# Patient Record
Sex: Male | Born: 1990
Health system: Southern US, Community
[De-identification: ages and names within clinical notes are randomized; demographics above are authoritative.]

## PROBLEM LIST (undated history)

## (undated) DIAGNOSIS — E669 Obesity, unspecified: Secondary | ICD-10-CM

## (undated) DIAGNOSIS — E1169 Type 2 diabetes mellitus with other specified complication: Secondary | ICD-10-CM

## (undated) DIAGNOSIS — F319 Bipolar disorder, unspecified: Secondary | ICD-10-CM

## (undated) DIAGNOSIS — J45909 Unspecified asthma, uncomplicated: Secondary | ICD-10-CM

## (undated) DIAGNOSIS — I10 Essential (primary) hypertension: Secondary | ICD-10-CM

## (undated) DIAGNOSIS — F988 Other specified behavioral and emotional disorders with onset usually occurring in childhood and adolescence: Secondary | ICD-10-CM

## (undated) HISTORY — DX: Bipolar disorder, unspecified: F31.9

## (undated) HISTORY — DX: Obesity, unspecified: E66.9

## (undated) HISTORY — DX: Essential (primary) hypertension: I10

## (undated) HISTORY — PX: NO PAST SURGERIES: SHX2092

## (undated) HISTORY — DX: Unspecified asthma, uncomplicated: J45.909

## (undated) HISTORY — DX: Other specified behavioral and emotional disorders with onset usually occurring in childhood and adolescence: F98.8

## (undated) HISTORY — DX: Type 2 diabetes mellitus with other specified complication: E11.69

---
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
## 2010-07-24 NOTE — Op Note (Signed)
   NAME:  Luis Boyer, Luis Boyer                          ACCOUNT NO.:  [id number]   MEDICAL RECORD NO.:  [id number]                   PATIENT TYPE:  AMB   LOCATION:  DSC                                  FACILITY:  [facility]   PHYSICIAN:  Pasty Spillers. Maple Hudson, M.D.              DATE OF BIRTH:  1990/06/20   DATE OF PROCEDURE:  11/23/2002  DATE OF DISCHARGE:                                 OPERATIVE REPORT   PREOPERATIVE DIAGNOSIS:  V pattern exotropia.   POSTOPERATIVE DIAGNOSIS:  V pattern exotropia.   PROCEDURE:  Lateral rectus muscle recession, 7.0 mm OU, with one-half tendon  width up-shift.   SURGEON:  Pasty Spillers. Maple Hudson, M.D.   ANESTHESIA:  General (laryngeal mask).   COMPLICATIONS:  None.   DESCRIPTION OF PROCEDURE:  After routine preoperative evaluation including  informed consent from the parents, the patient was taken to the operating  room, where he was identified by me.  General anesthesia was induced without  difficulty after placement of appropriate monitors.  The patient was prepped  and draped in standard sterile fashion.  A lid speculum placed in the right  eye.   Through an inferotemporal fornix incision through conjunctiva and Tenon's  fascia, the right lateral rectus muscle was engaged on a series of muscle  hooks and carefully cleared of its fascial attachments.  The tendon was  secured with a double-armed 6-0 Vicryl suture, with a double locking bite at  each border of the muscle, 1 mm from the insertion.  The muscle was  disinserted and was reattached to sclera with its inferior pole 7.0 mm  posterior to the middle of the original insertion, and its superior pole  approximately 10 mm superior to the inferior pole, in effect up-shifting it  one-half tendon width.  The suture ends were tied securely after the  position of the muscle had been checked and found to be accurate.  The  conjunctiva was closed with two interrupted 6-0 Vicryl sutures.  The lid  speculum was  transferred to the left eye, and an identical procedure was  performed, again effecting a 7.0 mm recession of the lateral rectus muscle,  with one-half tendon width up-shift.  TobraDex ophthalmic ointment was  placed in each eye.  The patient was awakened without difficulty and taken  to the recovery room in stable condition, having suffered no intraoperative  or immediate postoperative complications.                                               Pasty Spillers. Maple Hudson, M.D.    Cheron Schaumann  D:  11/23/2002  T:  11/24/2002  Job:  [id number]

## 2011-07-26 ENCOUNTER — Ambulatory Visit: Payer: Self-pay | Admitting: Family Medicine

## 2011-08-12 ENCOUNTER — Other Ambulatory Visit: Payer: Self-pay | Admitting: Family Medicine

## 2011-08-12 MED ORDER — NIFEDIPINE ER OSMOTIC RELEASE 90 MG PO TB24: 90.0000 mg | ORAL_TABLET | Freq: Every day | ORAL | Status: DC

## 2011-08-12 MED ORDER — QUINAPRIL HCL 10 MG PO TABS: 10.0000 mg | ORAL_TABLET | Freq: Every day | ORAL | Status: DC

## 2011-08-12 MED ORDER — METOPROLOL SUCCINATE ER 25 MG PO TB24: 25.0000 mg | ORAL_TABLET | Freq: Every day | ORAL | Status: DC

## 2011-08-12 NOTE — Telephone Encounter (Signed)
Refills x 3, spoke to Dr.Lowne she stated she would approve a 30-day supply only but to schedule patient for a medication f/u before she could approve. Spoke to [place] and she stated they do not have a car but she would arrange transportation next Wed. 06.13.13 @ 11am  1-refill Procardia XL 90 MG, take one tablet by mouth every day  2-Accupril 10MG , take one tablet by mouth every morning 3-Toprol XL 25 MG PAP, take one tablet by mouth every day   All qty would be 30  Last ov here 3.2009

## 2011-08-12 NOTE — Telephone Encounter (Signed)
Apt pending for 08/19/11. Please advise if it is ok to send these scripts.     KP

## 2011-08-12 NOTE — Telephone Encounter (Signed)
Ok to fill 1 month

## 2011-08-12 NOTE — Telephone Encounter (Signed)
Faxed to [facility] pharmacy at [phone]     KP

## 2011-08-19 ENCOUNTER — Ambulatory Visit: Payer: Self-pay | Admitting: Family Medicine

## 2011-08-24 ENCOUNTER — Ambulatory Visit (INDEPENDENT_AMBULATORY_CARE_PROVIDER_SITE_OTHER): Payer: Self-pay | Admitting: Family Medicine

## 2011-08-24 ENCOUNTER — Encounter: Payer: Self-pay | Admitting: Family Medicine

## 2011-08-24 VITALS — BP 122/82 | HR 71 | Temp 98.2°F | Ht 70.5 in | Wt 323.4 lb

## 2011-08-24 DIAGNOSIS — I10 Essential (primary) hypertension: Secondary | ICD-10-CM

## 2011-08-24 DIAGNOSIS — Z23 Encounter for immunization: Secondary | ICD-10-CM

## 2011-08-24 DIAGNOSIS — K219 Gastro-esophageal reflux disease without esophagitis: Secondary | ICD-10-CM

## 2011-08-24 DIAGNOSIS — R0609 Other forms of dyspnea: Secondary | ICD-10-CM

## 2011-08-24 DIAGNOSIS — F988 Other specified behavioral and emotional disorders with onset usually occurring in childhood and adolescence: Secondary | ICD-10-CM

## 2011-08-24 DIAGNOSIS — F319 Bipolar disorder, unspecified: Secondary | ICD-10-CM

## 2011-08-24 DIAGNOSIS — R0683 Snoring: Secondary | ICD-10-CM

## 2011-08-24 DIAGNOSIS — Z0000 Encounter for general adult medical examination without abnormal findings: Secondary | ICD-10-CM

## 2011-08-24 LAB — CBC WITH DIFFERENTIAL/PLATELET
Basophils Absolute: 0.1 10*3/uL (ref 0.0–0.1)
Eosinophils Absolute: 0.1 10*3/uL (ref 0.0–0.7)
Lymphocytes Relative: 34.5 % (ref 12.0–46.0)
Lymphs Abs: 2.4 10*3/uL (ref 0.7–4.0)
Monocytes Relative: 6.9 % (ref 3.0–12.0)
Platelets: 181 10*3/uL (ref 150.0–400.0)
RDW: 13.1 % (ref 11.5–14.6)

## 2011-08-24 LAB — LIPID PANEL
Cholesterol: 152 mg/dL (ref 0–200)
HDL: 36.8 mg/dL — ABNORMAL LOW (ref >39.00)
LDL Cholesterol: 92 mg/dL (ref 0–99)
VLDL: 23 mg/dL (ref 0.0–40.0)

## 2011-08-24 LAB — BASIC METABOLIC PANEL
BUN: 9 mg/dL (ref 6–23)
Calcium: 9.1 mg/dL (ref 8.4–10.5)
GFR: 123.37 mL/min (ref >60.00)
Glucose, Bld: 101 mg/dL — ABNORMAL HIGH (ref 70–99)

## 2011-08-24 LAB — POCT URINALYSIS DIPSTICK
Protein, UA: NEGATIVE
Spec Grav, UA: 1.02
Urobilinogen, UA: 0.2
pH, UA: 6

## 2011-08-24 LAB — TSH: TSH: 0.65 u[IU]/mL (ref 0.35–5.50)

## 2011-08-24 LAB — HEPATIC FUNCTION PANEL
AST: 33 U/L (ref 0–37)
Alkaline Phosphatase: 62 U/L (ref 39–117)
Total Bilirubin: 0.5 mg/dL (ref 0.3–1.2)

## 2011-08-24 MED ORDER — LISDEXAMFETAMINE DIMESYLATE 50 MG PO CAPS: 50.0000 mg | ORAL_CAPSULE | ORAL | Status: DC

## 2011-08-24 MED ORDER — LORAZEPAM 0.5 MG PO TABS: 0.5000 mg | ORAL_TABLET | Freq: Three times a day (TID) | ORAL | Status: DC

## 2011-08-24 MED ORDER — OMEPRAZOLE 40 MG PO CPDR: 40.0000 mg | DELAYED_RELEASE_CAPSULE | Freq: Every day | ORAL | Status: DC

## 2011-08-24 MED ORDER — MELOXICAM 15 MG PO TABS: ORAL_TABLET | ORAL | Status: DC

## 2011-08-24 MED ORDER — QUINAPRIL HCL 10 MG PO TABS: 10.0000 mg | ORAL_TABLET | Freq: Every day | ORAL | Status: DC

## 2011-08-24 MED ORDER — BUPROPION HCL ER (SR) 150 MG PO TB12: 150.0000 mg | ORAL_TABLET | Freq: Two times a day (BID) | ORAL | Status: DC

## 2011-08-24 MED ORDER — QUETIAPINE FUMARATE 100 MG PO TABS: 100.0000 mg | ORAL_TABLET | Freq: Every day | ORAL | Status: DC

## 2011-08-24 NOTE — Assessment & Plan Note (Signed)
Refill vyvanse 

## 2011-08-24 NOTE — Assessment & Plan Note (Signed)
Off all meds--- will restart ace I---  Recheck 2-3 weeks

## 2011-08-24 NOTE — Progress Notes (Signed)
  Subjective:    Patient ID: Luis Boyer, male    DOB: 03-06-1991, 21 y.o.   MRN: [id number]  HPI Pt here with mother for cpe.  Pt has been off all meds for several months.  bp [facility]  160/93 this week.  Pt with no headaches but is constipated and has chest pains in center of chest with no radiation.  + heartburn.      Review of Systems    Review of Systems  Constitutional: Negative for activity change, appetite change and fatigue.  HENT: Negative for hearing loss, congestion, tinnitus and ear discharge.  dentist--due Eyes: Negative for visual disturbance --opth due  Respiratory: Negative for cough, chest tightness and shortness of breath.   Cardiovascular: Negative for chest pain, palpitations and leg swelling.  Gastrointestinal: Negative for abdominal pain, diarrhea, constipation and abdominal distention.  Genitourinary: Negative for urgency, frequency, decreased urine volume and difficulty urinating.  Musculoskeletal: Negative for back pain, arthralgias and gait problem.  Skin: Negative for color change, pallor and rash.  Neurological: Negative for dizziness, light-headedness, numbness and headaches.  Hematological: Negative for adenopathy. Does not bruise/bleed easily.  Psychiatric/Behavioral: Negative for suicidal ideas, confusion, sleep disturbance, self-injury, dysphoric mood, decreased concentration and agitation.    Past Medical History  Diagnosis Date  . Asthma   . Hypertension   . ADD (attention deficit disorder)   . Bipolar 1 disorder    Family History  Problem Relation Age of Onset  . Cervical cancer Mother   . Diabetes Father   . Stroke Father   . Hypertension Father   . Hyperlipidemia Father   . Hyperlipidemia Brother   . Hypertension Brother    History  Substance Use Topics  . Smoking status: Never Smoker   . Smokeless tobacco: Never Used  . Alcohol Use: No     Objective:   Physical Exam  BP 122/82  Pulse 71  Temp 98.2 F (36.8 C) (Oral)  Ht  5' 10.5" (1.791 m)  Wt 323 lb 6.4 oz (146.693 kg)  BMI 45.75 kg/m2  SpO2 97% General appearance: alert, cooperative, appears stated age and no distress Head: Normocephalic, without obvious abnormality, atraumatic Eyes: conjunctivae/corneas clear. PERRL, EOM's intact. Fundi benign. Ears: normal TM's and external ear canals both ears Nose: Nares normal. Septum midline. Mucosa normal. No drainage or sinus tenderness. Throat: lips, mucosa, and tongue normal; teeth and gums normal Neck: no adenopathy, no carotid bruit, no JVD, supple, symmetrical, trachea midline and thyroid not enlarged, symmetric, no tenderness/mass/nodules Back: symmetric, no curvature. ROM normal. No CVA tenderness. Lungs: clear to auscultation bilaterally Chest wall: no tenderness Heart: regular rate and rhythm, S1, S2 normal, no murmur, click, rub or gallop Abdomen: soft, non-tender; bowel sounds normal; no masses,  no organomegaly Male genitalia: normal Rectal: na Extremities: extremities normal, atraumatic, no cyanosis or edema Pulses: 2+ and symmetric Skin: Skin color, texture, turgor normal. No rashes or lesions Lymph nodes: Cervical, supraclavicular, and axillary nodes normal. Neurologic: Alert and oriented X 3, normal strength and tone. Normal symmetric reflexes. Normal coordination and gait               Assessment & Plan:  cpe--check fasting labs         ghm utd

## 2011-08-24 NOTE — Assessment & Plan Note (Signed)
Refill meds Has no insurance--we need psych f/u

## 2011-08-24 NOTE — Patient Instructions (Addendum)
Preventive Care for Adults, Male A healthy lifestyle and preventative care can promote health and wellness. Preventative health guidelines for men include the following key practices:  A routine yearly physical is a good way to check with your caregiver about your health and preventative screening. It is a chance to share any concerns and updates on your health, and to receive a thorough exam.   Visit your dentist for a routine exam and preventative care every 6 months. Brush your teeth twice a day and floss once a day. Good oral hygiene prevents tooth decay and gum disease.   The frequency of eye exams is based on your age, health, family medical history, use of contact lenses, and other factors. Follow your caregiver's recommendations for frequency of eye exams.   Eat a healthy diet. Foods like vegetables, fruits, whole grains, low-fat dairy products, and lean protein foods contain the nutrients you need without too many calories. Decrease your intake of foods high in solid fats, added sugars, and salt. Eat the right amount of calories for you.Get information about a proper diet from your caregiver, if necessary.   Regular physical exercise is one of the most important things you can do for your health. Most adults should get at least 150 minutes of moderate-intensity exercise (any activity that increases your heart rate and causes you to sweat) each week. In addition, most adults need muscle-strengthening exercises on 2 or more days a week.   Maintain a healthy weight. The body mass index (BMI) is a screening tool to identify possible weight problems. It provides an estimate of body fat based on height and weight. Your caregiver can help determine your BMI, and can help you achieve or maintain a healthy weight.For adults 20 years and older:   A BMI below 18.5 is considered underweight.   A BMI of 18.5 to 24.9 is normal.   A BMI of 25 to 29.9 is considered overweight.   A BMI of 30 and above  is considered obese.   Maintain normal blood lipids and cholesterol levels by exercising and minimizing your intake of saturated fat. Eat a balanced diet with plenty of fruit and vegetables. Blood tests for lipids and cholesterol should begin at age 20 and be repeated every 5 years. If your lipid or cholesterol levels are high, you are over 50, or you are a high risk for heart disease, you may need your cholesterol levels checked more frequently.Ongoing high lipid and cholesterol levels should be treated with medicines if diet and exercise are not effective.   If you smoke, find out from your caregiver how to quit. If you do not use tobacco, do not start.   If you choose to drink alcohol, do not exceed 2 drinks per day. One drink is considered to be 12 ounces (355 mL) of beer, 5 ounces (148 mL) of wine, or 1.5 ounces (44 mL) of liquor.   Avoid use of street drugs. Do not share needles with anyone. Ask for help if you need support or instructions about stopping the use of drugs.   High blood pressure causes heart disease and increases the risk of stroke. Your blood pressure should be checked at least every 1 to 2 years. Ongoing high blood pressure should be treated with medicines, if weight loss and exercise are not effective.   If you are 45 to 22 years old, ask your caregiver if you should take aspirin to prevent heart disease.   Diabetes screening involves taking a blood   sample to check your fasting blood sugar level. This should be done once every 3 years, after age 45, if you are within normal weight and without risk factors for diabetes. Testing should be considered at a younger age or be carried out more frequently if you are overweight and have at least 1 risk factor for diabetes.   Colorectal cancer can be detected and often prevented. Most routine colorectal cancer screening begins at the age of 50 and continues through age 75. However, your caregiver may recommend screening at an earlier  age if you have risk factors for colon cancer. On a yearly basis, your caregiver may provide home test kits to check for hidden blood in the stool. Use of a small camera at the end of a tube, to directly examine the colon (sigmoidoscopy or colonoscopy), can detect the earliest forms of colorectal cancer. Talk to your caregiver about this at age 50, when routine screening begins. Direct examination of the colon should be repeated every 5 to 10 years through age 75, unless early forms of pre-cancerous polyps or small growths are found.   Hepatitis C blood testing is recommended for all people born from 1945 through 1965 and any individual with known risks for hepatitis C.   Practice safe sex. Use condoms and avoid high-risk sexual practices to reduce the spread of sexually transmitted infections (STIs). STIs include gonorrhea, chlamydia, syphilis, trichomonas, herpes, HPV, and human immunodeficiency virus (HIV). Herpes, HIV, and HPV are viral illnesses that have no cure. They can result in disability, cancer, and death.   A one-time screening for abdominal aortic aneurysm (AAA) and surgical repair of large AAAs by sound wave imaging (ultrasonography) is recommended for ages 65 to 75 years who are current or former smokers.   Healthy men should no longer receive prostate-specific antigen (PSA) blood tests as part of routine cancer screening. Consult with your caregiver about prostate cancer screening.   Testicular cancer screening is not recommended for adult males who have no symptoms. Screening includes self-exam, caregiver exam, and other screening tests. Consult with your caregiver about any symptoms you have or any concerns you have about testicular cancer.   Use sunscreen with skin protection factor (SPF) of 30 or more. Apply sunscreen liberally and repeatedly throughout the day. You should seek shade when your shadow is shorter than you. Protect yourself by wearing long sleeves, pants, a  wide-brimmed hat, and sunglasses year round, whenever you are outdoors.   Once a month, do a whole body skin exam, using a mirror to look at the skin on your back. Notify your caregiver of new moles, moles that have irregular borders, moles that are larger than a pencil eraser, or moles that have changed in shape or color.   Stay current with required immunizations.   Influenza. You need a dose every fall (or winter). The composition of the flu vaccine changes each year, so being vaccinated once is not enough.   Pneumococcal polysaccharide. You need 1 to 2 doses if you smoke cigarettes or if you have certain chronic medical conditions. You need 1 dose at age 65 (or older) if you have never been vaccinated.   Tetanus, diphtheria, pertussis (Tdap, Td). Get 1 dose of Tdap vaccine if you are younger than age 65 years, are over 65 and have contact with an infant, are a healthcare worker, or simply want to be protected from whooping cough. After that, you need a Td booster dose every 10 years. Consult your caregiver if   you have not had at least 3 tetanus and diphtheria-containing shots sometime in your life or have a deep or dirty wound.   HPV. This vaccine is recommended for males 13 through 21 years of age. This vaccine may be given to men 22 through 22 years of age who have not completed the 3 dose series. It is recommended for men through age 26 who have sex with men or whose immune system is weakened because of HIV infection, other illness, or medications. The vaccine is given in 3 doses over 6 months.   Measles, mumps, rubella (MMR). You need at least 1 dose of MMR if you were born in 1957 or later. You may also need a 2nd dose.   Meningococcal. If you are age 19 to 21 years and a first-year college student living in a residence hall, or have one of several medical conditions, you need to get vaccinated against meningococcal disease. You may also need additional booster doses.   Zoster (shingles).  If you are age 60 years or older, you should get this vaccine.   Varicella (chickenpox). If you have never had chickenpox or you were vaccinated but received only 1 dose, talk to your caregiver to find out if you need this vaccine.   Hepatitis A. You need this vaccine if you have a specific risk factor for hepatitis A virus infection, or you simply wish to be protected from this disease. The vaccine is usually given as 2 doses, 6 to 18 months apart.   Hepatitis B. You need this vaccine if you have a specific risk factor for hepatitis B virus infection or you simply wish to be protected from this disease. The vaccine is given in 3 doses, usually over 6 months.  Preventative Service / Frequency Ages 19 to 39  Blood pressure check.** / Every 1 to 2 years.   Lipid and cholesterol check.** / Every 5 years beginning at age 20.   Hepatitis C blood test.** / For any individual with known risks for hepatitis C.   Skin self-exam. / Monthly.   Influenza immunization.** / Every year.   Pneumococcal polysaccharide immunization.** / 1 to 2 doses if you smoke cigarettes or if you have certain chronic medical conditions.   Tetanus, diphtheria, pertussis (Tdap,Td) immunization. / A one-time dose of Tdap vaccine. After that, you need a Td booster dose every 10 years.   HPV immunization. / 3 doses over 6 months, if 26 and younger.   Measles, mumps, rubella (MMR) immunization. / You need at least 1 dose of MMR if you were born in 1957 or later. You may also need a 2nd dose.   Meningococcal immunization. / 1 dose if you are age 19 to 21 years and a first-year college student living in a residence hall, or have one of several medical conditions, you need to get vaccinated against meningococcal disease. You may also need additional booster doses.   Varicella immunization.** / Consult your caregiver.   Hepatitis A immunization.** / Consult your caregiver. 2 doses, 6 to 18 months apart.   Hepatitis B  immunization.** / Consult your caregiver. 3 doses usually over 6 months.  Ages 40 to 64  Blood pressure check.** / Every 1 to 2 years.   Lipid and cholesterol check.** / Every 5 years beginning at age 20.   Fecal occult blood test (FOBT) of stool. / Every year beginning at age 50 and continuing until age 75. You may not have to do this test if   you get colonoscopy every 10 years.   Flexible sigmoidoscopy** or colonoscopy.** / Every 5 years for a flexible sigmoidoscopy or every 10 years for a colonoscopy beginning at age 50 and continuing until age 75.   Hepatitis C blood test.** / For all people born from 1945 through 1965 and any individual with known risks for hepatitis C.   Skin self-exam. / Monthly.   Influenza immunization.** / Every year.   Pneumococcal polysaccharide immunization.** / 1 to 2 doses if you smoke cigarettes or if you have certain chronic medical conditions.   Tetanus, diphtheria, pertussis (Tdap/Td) immunization.** / A one-time dose of Tdap vaccine. After that, you need a Td booster dose every 10 years.   Measles, mumps, rubella (MMR) immunization. / You need at least 1 dose of MMR if you were born in 1957 or later. You may also need a 2nd dose.   Varicella immunization.**/ Consult your caregiver.   Meningococcal immunization.** / Consult your caregiver.   Hepatitis A immunization.** / Consult your caregiver. 2 doses, 6 to 18 months apart.   Hepatitis B immunization.** / Consult your caregiver. 3 doses, usually over 6 months.  Ages 65 and over  Blood pressure check.** / Every 1 to 2 years.   Lipid and cholesterol check.**/ Every 5 years beginning at age 20.   Fecal occult blood test (FOBT) of stool. / Every year beginning at age 50 and continuing until age 75. You may not have to do this test if you get colonoscopy every 10 years.   Flexible sigmoidoscopy** or colonoscopy.** / Every 5 years for a flexible sigmoidoscopy or every 10 years for a colonoscopy  beginning at age 50 and continuing until age 75.   Hepatitis C blood test.** / For all people born from 1945 through 1965 and any individual with known risks for hepatitis C.   Abdominal aortic aneurysm (AAA) screening.** / A one-time screening for ages 65 to 75 years who are current or former smokers.   Skin self-exam. / Monthly.   Influenza immunization.** / Every year.   Pneumococcal polysaccharide immunization.** / 1 dose at age 65 (or older) if you have never been vaccinated.   Tetanus, diphtheria, pertussis (Tdap, Td) immunization. / A one-time dose of Tdap vaccine if you are over 65 and have contact with an infant, are a healthcare worker, or simply want to be protected from whooping cough. After that, you need a Td booster dose every 10 years.   Varicella immunization. ** / Consult your caregiver.   Meningococcal immunization.** / Consult your caregiver.   Hepatitis A immunization. ** / Consult your caregiver. 2 doses, 6 to 18 months apart.   Hepatitis B immunization.** / Check with your caregiver. 3 doses, usually over 6 months.  **Family history and personal history of risk and conditions may change your caregiver's recommendations. Document Released: 04/20/2001 Document Revised: 02/11/2011 Document Reviewed: 07/20/2010 [employer] Patient Information 2012 [employer]. 

## 2011-08-27 ENCOUNTER — Other Ambulatory Visit: Payer: Self-pay | Admitting: Family Medicine

## 2011-08-27 MED ORDER — NIFEDIPINE ER OSMOTIC RELEASE 90 MG PO TB24: 90.0000 mg | ORAL_TABLET | Freq: Every day | ORAL | Status: DC

## 2011-08-27 NOTE — Telephone Encounter (Signed)
Faxed to [phone]    KP

## 2011-08-27 NOTE — Telephone Encounter (Signed)
Last ov 6.18.13 - refills x 2 1-Procardia XL 90MG  PAP #100 take one tablet by mouth every day, last fill 11.16.2012-NOTE our records indicate last wrt. 6.6.13  2-Accupril 10MG  PAP #100 take one tablet by mouth every morning, last fill 11.16.12-NOTE our records indicate last wrt 6.18.13 *RE-ordered during OV

## 2011-09-29 ENCOUNTER — Institutional Professional Consult (permissible substitution): Payer: Self-pay | Admitting: Pulmonary Disease

## 2011-10-01 ENCOUNTER — Other Ambulatory Visit: Payer: Self-pay | Admitting: Family Medicine

## 2011-10-01 MED ORDER — METOPROLOL SUCCINATE ER 25 MG PO TB24: 25.0000 mg | ORAL_TABLET | Freq: Every day | ORAL | Status: DC

## 2011-10-01 NOTE — Telephone Encounter (Signed)
Refill Toprol XL 25mg  #100 take one tablet by mouth every day - last fill 06.27.2013 Last ov 6.18.13 V70 I do not see as a current or past med

## 2011-10-12 ENCOUNTER — Telehealth: Payer: Self-pay | Admitting: Family Medicine

## 2011-10-12 DIAGNOSIS — F988 Other specified behavioral and emotional disorders with onset usually occurring in childhood and adolescence: Secondary | ICD-10-CM

## 2011-10-12 MED ORDER — LISDEXAMFETAMINE DIMESYLATE 50 MG PO CAPS: 50.0000 mg | ORAL_CAPSULE | ORAL | Status: DC

## 2011-10-12 NOTE — Telephone Encounter (Signed)
please advise on medication changes     KP   Patient unable to get a 90 day supply of the Vyvanse and would like #3 30 day prescriptions.   Rx printed    KP

## 2011-10-12 NOTE — Telephone Encounter (Signed)
ok 

## 2011-10-12 NOTE — Telephone Encounter (Signed)
Fax from [facility] Dept: Refill: Mobic 15mg . *We cannot get mobic for free-would you consider changing to celebrex? We can get for free. Please send new rx with qty and directions if approved.*  Refill: Omeprazole 20mg . *We cannot get omeprazole for free-we can get Nexium @ no charge for pt. Can we change pt to Nexium? If so, please send rx w/qty and directions.*

## 2011-10-13 MED ORDER — CELECOXIB 200 MG PO CAPS: 200.0000 mg | ORAL_CAPSULE | Freq: Every day | ORAL | Status: DC

## 2011-10-13 MED ORDER — ESOMEPRAZOLE MAGNESIUM 40 MG PO CPDR: 40.0000 mg | DELAYED_RELEASE_CAPSULE | Freq: Every day | ORAL | Status: DC

## 2011-10-13 NOTE — Telephone Encounter (Signed)
Faxed to the [facility]--- KP

## 2011-10-25 ENCOUNTER — Other Ambulatory Visit: Payer: Self-pay

## 2011-10-25 MED ORDER — CELECOXIB 200 MG PO CAPS: 200.0000 mg | ORAL_CAPSULE | Freq: Every day | ORAL | Status: AC

## 2011-10-25 MED ORDER — ESOMEPRAZOLE MAGNESIUM 40 MG PO CPDR: 40.0000 mg | DELAYED_RELEASE_CAPSULE | Freq: Every day | ORAL | Status: DC

## 2011-10-25 NOTE — Telephone Encounter (Signed)
Rx Re-faxed.      KP 

## 2011-12-13 ENCOUNTER — Other Ambulatory Visit: Payer: Self-pay | Admitting: Family Medicine

## 2011-12-13 NOTE — Telephone Encounter (Signed)
Last seen and filled 08/24/11 # 60 please advise       KP

## 2011-12-20 ENCOUNTER — Ambulatory Visit (INDEPENDENT_AMBULATORY_CARE_PROVIDER_SITE_OTHER): Payer: Medicaid Other | Admitting: Family Medicine

## 2011-12-20 ENCOUNTER — Encounter: Payer: Self-pay | Admitting: Family Medicine

## 2011-12-20 ENCOUNTER — Telehealth: Payer: Self-pay

## 2011-12-20 VITALS — BP 144/76 | HR 91 | Temp 98.8°F | Ht 70.5 in | Wt 296.0 lb

## 2011-12-20 DIAGNOSIS — K5901 Slow transit constipation: Secondary | ICD-10-CM

## 2011-12-20 DIAGNOSIS — T783XXA Angioneurotic edema, initial encounter: Secondary | ICD-10-CM

## 2011-12-20 DIAGNOSIS — I10 Essential (primary) hypertension: Secondary | ICD-10-CM

## 2011-12-20 DIAGNOSIS — Z23 Encounter for immunization: Secondary | ICD-10-CM

## 2011-12-20 DIAGNOSIS — K625 Hemorrhage of anus and rectum: Secondary | ICD-10-CM | POA: Insufficient documentation

## 2011-12-20 LAB — CBC WITH DIFFERENTIAL/PLATELET
Basophils Absolute: 0.1 10*3/uL (ref 0.0–0.1)
Eosinophils Relative: 0.4 % (ref 0.0–5.0)
HCT: 42.1 % (ref 39.0–52.0)
Lymphocytes Relative: 14.4 % (ref 12.0–46.0)
Monocytes Relative: 5.5 % (ref 3.0–12.0)
Neutrophils Relative %: 79.2 % — ABNORMAL HIGH (ref 43.0–77.0)
Platelets: 149 10*3/uL — ABNORMAL LOW (ref 150.0–400.0)
RDW: 12.8 % (ref 11.5–14.6)
WBC: 10.7 10*3/uL — ABNORMAL HIGH (ref 4.5–10.5)

## 2011-12-20 MED ORDER — TRAMADOL HCL 50 MG PO TABS: ORAL_TABLET | ORAL | Status: DC

## 2011-12-20 MED ORDER — METOPROLOL SUCCINATE ER 50 MG PO TB24: 50.0000 mg | ORAL_TABLET | Freq: Every day | ORAL | Status: DC

## 2011-12-20 MED ORDER — PREDNISONE 10 MG PO TABS: ORAL_TABLET | ORAL | Status: DC

## 2011-12-20 NOTE — Telephone Encounter (Signed)
It is on his med list on avs--- d/c accupril and inc metoprolol

## 2011-12-20 NOTE — Assessment & Plan Note (Signed)
Drink plenty of fluids Inc fiber in diet  Miralax, stool softener Refer to GI

## 2011-12-20 NOTE — Telephone Encounter (Signed)
Pt mom forgot what med needs to be d/c and what med increased? Plz advise    MW

## 2011-12-20 NOTE — Assessment & Plan Note (Signed)
prob secondary to ace I also sob with celebrex

## 2011-12-20 NOTE — Progress Notes (Signed)
  Subjective:    Patient ID: Luis Boyer, male    DOB: 02-Feb-1991, 21 y.o.   MRN: [id number]  HPI Pt here c/o swollen lips x few days and sob with celebrex.   Pt also noticed blood in toilet and on Tp No chest pain.  As long as he does not take celebrex breathing is normal.   Review of Systems    as above Objective:   Physical Exam  Constitutional: He appears well-developed and well-nourished.  HENT:       Lips swollen Tongue normal  Eyes: Conjunctivae normal and EOM are normal. Right eye exhibits no discharge. Left eye exhibits no discharge.  Neck: Normal range of motion. Neck supple.  Cardiovascular: Normal rate, regular rhythm and normal heart sounds.   Pulmonary/Chest: Effort normal and breath sounds normal. No respiratory distress. He has no wheezes. He has no rales.  Genitourinary: Guaiac negative stool.       Heme neg soft brown stool  Psychiatric: He has a normal mood and affect. His behavior is normal. Judgment and thought content normal.   Filed Vitals:   12/20/11 1109  BP: 144/76  Pulse: 91  Temp: 98.8 F (37.1 C)  TempSrc: Oral  Height: 5' 10.5" (1.791 m)  Weight: 296 lb (134.265 kg)  SpO2: 98%         Assessment & Plan:

## 2011-12-20 NOTE — Telephone Encounter (Signed)
Discussed with pt

## 2011-12-20 NOTE — Assessment & Plan Note (Signed)
D/c accupril and start metoprolol  rto 2 weeks

## 2011-12-20 NOTE — Patient Instructions (Addendum)
Rectal Bleeding Rectal bleeding is when blood passes out of the anus. It is usually a sign that something is wrong. It may not be serious, but it should always be evaluated. Rectal bleeding may present as bright red blood or extremely dark stools. The color may range from dark red or maroon to black (like tar). It is important that the cause of rectal bleeding be identified so treatment can be started and the problem corrected. CAUSES   Hemorrhoids. These are enlarged (dilated) blood vessels or veins in the anal or rectal area.  Fistulas. Theseare abnormal, burrowing channels that usually run from inside the rectum to the skin around the anus. They can bleed.  Anal fissures. This is a tear in the tissue of the anus. Bleeding occurs with bowel movements.  Diverticulosis. This is a condition in which pockets or sacs project from the bowel wall. Occasionally, the sacs can bleed.  Diverticulitis. Thisis an infection involving diverticulosis of the colon.  Proctitis and colitis. These are conditions in which the rectum, colon, or both, can become inflamed and pitted (ulcerated).  Polyps and cancer. Polyps are non-cancerous (benign) growths in the colon that may bleed. Certain types of polyps turn into cancer.  Protrusion of the rectum. Part of the rectum can project from the anus and bleed.  Certain medicines.  Intestinal infections.  Blood vessel abnormalities. HOME CARE INSTRUCTIONS  Eat a high-fiber diet to keep your stool soft.  Limit activity.  Drink enough fluids to keep your urine clear or pale yellow.  Warm baths may be useful to soothe rectal pain.  Follow up with your caregiver as directed. SEEK IMMEDIATE MEDICAL CARE IF:  You develop increased bleeding.  You have black or dark red stools.  You vomit blood or material that looks like coffee grounds.  You have abdominal pain or tenderness.  You have a fever.  You feel weak, nauseous, or you faint.  You have  severe rectal pain or you are unable to have a bowel movement. MAKE SURE YOU:  Understand these instructions.  Will watch your condition.  Will get help right away if you are not doing well or get worse. Document Released: 08/14/2001 Document Revised: 05/17/2011 Document Reviewed: 08/09/2010 [facility] Patient Information 2013 [place], [place]. Constipation, Adult Constipation is when a person has fewer than 3 bowel movements a week; has difficulty having a bowel movement; or has stools that are dry, hard, or larger than normal. As people grow older, constipation is more common. If you try to fix constipation with medicines that make you have a bowel movement (laxatives), the problem may get worse. Long-term laxative use may cause the muscles of the colon to become weak. A low-fiber diet, not taking in enough fluids, and taking certain medicines may make constipation worse. CAUSES   Certain medicines, such as antidepressants, pain medicine, iron supplements, antacids, and water pills.   Certain diseases, such as diabetes, irritable bowel syndrome (IBS), thyroid disease, or depression.   Not drinking enough water.   Not eating enough fiber-rich foods.   Stress or travel.  Lack of physical activity or exercise.  Not going to the restroom when there is the urge to have a bowel movement.  Ignoring the urge to have a bowel movement.  Using laxatives too much. SYMPTOMS   Having fewer than 3 bowel movements a week.   Straining to have a bowel movement.   Having hard, dry, or larger than normal stools.   Feeling full or bloated.  Pain in the lower abdomen.  Not feeling relief after having a bowel movement. DIAGNOSIS  Your caregiver will take a medical history and perform a physical exam. Further testing may be done for severe constipation. Some tests may include:   A barium enema X-ray to examine your rectum, colon, and sometimes, your small intestine.  A sigmoidoscopy  to examine your lower colon.  A colonoscopy to examine your entire colon. TREATMENT  Treatment will depend on the severity of your constipation and what is causing it. Some dietary treatments include drinking more fluids and eating more fiber-rich foods. Lifestyle treatments may include regular exercise. If these diet and lifestyle recommendations do not help, your caregiver may recommend taking over-the-counter laxative medicines to help you have bowel movements. Prescription medicines may be prescribed if over-the-counter medicines do not work.  HOME CARE INSTRUCTIONS   Increase dietary fiber in your diet, such as fruits, vegetables, whole grains, and beans. Limit high-fat and processed sugars in your diet, such as [place] fries, hamburgers, cookies, candies, and soda.   A fiber supplement may be added to your diet if you cannot get enough fiber from foods.   Drink enough fluids to keep your urine clear or pale yellow.   Exercise regularly or as directed by your caregiver.   Go to the restroom when you have the urge to go. Do not hold it.  Only take medicines as directed by your caregiver. Do not take other medicines for constipation without talking to your caregiver first. SEEK IMMEDIATE MEDICAL CARE IF:   You have bright red blood in your stool.   Your constipation lasts for more than 4 days or gets worse.   You have abdominal or rectal pain.   You have thin, pencil-like stools.  You have unexplained weight loss. MAKE SURE YOU:   Understand these instructions.  Will watch your condition.  Will get help right away if you are not doing well or get worse. Document Released: 11/21/2003 Document Revised: 05/17/2011 Document Reviewed: 01/26/2011 [facility] Patient Information 2013 [place], [place].

## 2011-12-22 ENCOUNTER — Encounter: Payer: Self-pay | Admitting: Gastroenterology

## 2012-01-13 ENCOUNTER — Encounter: Payer: Self-pay | Admitting: Gastroenterology

## 2012-01-13 ENCOUNTER — Ambulatory Visit (INDEPENDENT_AMBULATORY_CARE_PROVIDER_SITE_OTHER): Payer: Medicaid Other | Admitting: Gastroenterology

## 2012-01-13 VITALS — BP 136/80 | HR 76 | Ht 69.0 in | Wt 298.0 lb

## 2012-01-13 DIAGNOSIS — K625 Hemorrhage of anus and rectum: Secondary | ICD-10-CM

## 2012-01-13 DIAGNOSIS — K219 Gastro-esophageal reflux disease without esophagitis: Secondary | ICD-10-CM

## 2012-01-13 DIAGNOSIS — K648 Other hemorrhoids: Secondary | ICD-10-CM

## 2012-01-13 MED ORDER — HYDROCORTISONE ACETATE 25 MG RE SUPP: 25.0000 mg | RECTAL | Status: DC | PRN

## 2012-01-13 NOTE — Patient Instructions (Addendum)
Please go to basement today before leaving  You were given a high fiber diet today to follow   Please purchase Metamucil over the counter. Take as directed.  We have sent the following medications to your pharmacy for you to pick up at your convenience:Anusol Suppositories. Please use as needed

## 2012-01-13 NOTE — Progress Notes (Signed)
History of Present Illness:  This is a 21 year old morbidly obese patient who lives with his mother and cares a diagnosis of bipolar disorder and anger management problems. He was recently on Celebrex for musculoskeletal pain and developed angioedema and also 1 episode of rectal bleeding. He denies chronic GI complaints except for mild constipation. He has had no further rectal bleeding or other gastrointestinal symptoms. Hemoccult cards at primary care were negative and CBC was normal. He denies a specific food intolerances the patient has been started on Nexium 40 mg a day for acid reflux, and seems to have had a good response. He is on multiple medications listed and reviewed his record. Family history is entirely noncontributory.  I have reviewed this patient's present history, medical and surgical past history, allergies and medications.     ROS: The remainder of the 10 pt. ROS      Physical Exam: Obese patient in no distress appears stated age. Blood pressure 136/80 pulse 76, weight 298 with a BMI of 44.01. Abdominal exam is difficult because of massive obesity, but I cannot appreciate any definite organomegaly, masses or tenderness. Mental status seems normal but most of his questions are answered by is mother.  Anoscopy: I cannot appreciate any perianal rashes, fissures or fistulae. Digital exam shows no masses or tenderness with soft to hard stool present which is guaiac negative. Anoscopy shows some small posterior nonbleeding hemorrhoids. Otherwise cannot appreciate any abnormalities.  Assessment and Plans: This patient had an episode of hemorrhoidal bleeding perhaps related to constipation and Celebrex use. He currently is asymptomatic, stool guaiac negative, CBC is normal, family history is negative, and anoscopic exam shows some small internal hemorrhoids. I have placed him on a high fiber diet with daily Metamucil and when necessary Anusol-HC suppositories. He is to return IFOB stool  cards for exam. His morbid obesity is being managed by primary care, but I doubt he is a candidate for bariatric surgery because of his psychiatric difficulties. Review of his labs shows normal liver function tests.

## 2012-01-28 ENCOUNTER — Other Ambulatory Visit (INDEPENDENT_AMBULATORY_CARE_PROVIDER_SITE_OTHER): Payer: Medicaid Other

## 2012-01-28 DIAGNOSIS — D7289 Other specified disorders of white blood cells: Secondary | ICD-10-CM

## 2012-01-28 LAB — CBC WITH DIFFERENTIAL/PLATELET
Basophils Relative: 0.2 % (ref 0.0–3.0)
Eosinophils Relative: 0.7 % (ref 0.0–5.0)
HCT: 43 % (ref 39.0–52.0)
Hemoglobin: 14.3 g/dL (ref 13.0–17.0)
Lymphs Abs: 2.4 10*3/uL (ref 0.7–4.0)
MCV: 86.8 fl (ref 78.0–100.0)
Monocytes Absolute: 0.6 10*3/uL (ref 0.1–1.0)
Monocytes Relative: 6.7 % (ref 3.0–12.0)
RBC: 4.95 Mil/uL (ref 4.22–5.81)
WBC: 9 10*3/uL (ref 4.5–10.5)

## 2012-02-02 ENCOUNTER — Other Ambulatory Visit: Payer: Self-pay | Admitting: Family Medicine

## 2012-02-02 DIAGNOSIS — F988 Other specified behavioral and emotional disorders with onset usually occurring in childhood and adolescence: Secondary | ICD-10-CM

## 2012-02-02 MED ORDER — LISDEXAMFETAMINE DIMESYLATE 50 MG PO CAPS: 50.0000 mg | ORAL_CAPSULE | ORAL | Status: DC

## 2012-02-02 NOTE — Telephone Encounter (Signed)
Pt called in and would like VYVANSE put in the mail for her please.

## 2012-02-02 NOTE — Telephone Encounter (Signed)
Rx printed for Nov, Dec, Jan and patient aware to pick them up.     KP

## 2012-02-04 ENCOUNTER — Other Ambulatory Visit: Payer: Self-pay | Admitting: Family Medicine

## 2012-02-04 NOTE — Telephone Encounter (Signed)
Last seen and filled 12/20/11 # 60.   please advise     KP

## 2012-02-23 ENCOUNTER — Other Ambulatory Visit: Payer: Self-pay | Admitting: Family Medicine

## 2012-02-23 NOTE — Telephone Encounter (Signed)
Last seen 12/10/11 and filled 12/13/11 # 60. Please advise     KP

## 2012-02-24 MED ORDER — LORAZEPAM 0.5 MG PO TABS: 0.5000 mg | ORAL_TABLET | Freq: Three times a day (TID) | ORAL | Status: DC

## 2012-02-24 NOTE — Addendum Note (Signed)
Addended by: Arnette Norris on: 02/24/2012 09:15 AM   Modules accepted: Orders

## 2012-04-12 ENCOUNTER — Other Ambulatory Visit: Payer: Self-pay | Admitting: Family Medicine

## 2012-04-12 DIAGNOSIS — F319 Bipolar disorder, unspecified: Secondary | ICD-10-CM

## 2012-04-12 MED ORDER — BUPROPION HCL ER (SR) 150 MG PO TB12: 150.0000 mg | ORAL_TABLET | Freq: Two times a day (BID) | ORAL | Status: DC

## 2012-04-12 NOTE — Telephone Encounter (Signed)
refill WELLBUTRIN SR 150 MG Take 1 tablet (150 mg total) by mouth 2 (two) times daily. #60 last fill 6.18.13

## 2012-05-09 ENCOUNTER — Ambulatory Visit: Payer: Medicaid Other | Admitting: Family Medicine

## 2012-06-08 ENCOUNTER — Encounter: Payer: Self-pay | Admitting: Lab

## 2012-06-09 ENCOUNTER — Encounter: Payer: Self-pay | Admitting: Family Medicine

## 2012-06-09 ENCOUNTER — Ambulatory Visit (INDEPENDENT_AMBULATORY_CARE_PROVIDER_SITE_OTHER): Payer: Medicaid Other | Admitting: Family Medicine

## 2012-06-09 VITALS — BP 136/76 | HR 109 | Temp 98.7°F | Wt 313.8 lb

## 2012-06-09 DIAGNOSIS — M549 Dorsalgia, unspecified: Secondary | ICD-10-CM

## 2012-06-09 DIAGNOSIS — I10 Essential (primary) hypertension: Secondary | ICD-10-CM | POA: Diagnosis not present

## 2012-06-09 DIAGNOSIS — K219 Gastro-esophageal reflux disease without esophagitis: Secondary | ICD-10-CM

## 2012-06-09 DIAGNOSIS — F319 Bipolar disorder, unspecified: Secondary | ICD-10-CM

## 2012-06-09 DIAGNOSIS — F988 Other specified behavioral and emotional disorders with onset usually occurring in childhood and adolescence: Secondary | ICD-10-CM

## 2012-06-09 DIAGNOSIS — F411 Generalized anxiety disorder: Secondary | ICD-10-CM | POA: Diagnosis not present

## 2012-06-09 LAB — CBC WITH DIFFERENTIAL/PLATELET
Basophils Absolute: 0.1 10*3/uL (ref 0.0–0.1)
Basophils Relative: 0.4 % (ref 0.0–3.0)
Eosinophils Absolute: 0.1 10*3/uL (ref 0.0–0.7)
HCT: 43.9 % (ref 39.0–52.0)
Hemoglobin: 15.1 g/dL (ref 13.0–17.0)
Lymphocytes Relative: 20.5 % (ref 12.0–46.0)
Lymphs Abs: 2.5 10*3/uL (ref 0.7–4.0)
MCHC: 34.3 g/dL (ref 30.0–36.0)
MCV: 83.8 fl (ref 78.0–100.0)
Neutro Abs: 8.6 10*3/uL — ABNORMAL HIGH (ref 1.4–7.7)
RBC: 5.24 Mil/uL (ref 4.22–5.81)
RDW: 13.5 % (ref 11.5–14.6)

## 2012-06-09 LAB — HEPATIC FUNCTION PANEL
AST: 24 U/L (ref 0–37)
Albumin: 4.4 g/dL (ref 3.5–5.2)
Alkaline Phosphatase: 78 U/L (ref 39–117)
Total Protein: 7.8 g/dL (ref 6.0–8.3)

## 2012-06-09 LAB — BASIC METABOLIC PANEL
CO2: 24 mEq/L (ref 19–32)
Chloride: 102 mEq/L (ref 96–112)
Glucose, Bld: 107 mg/dL — ABNORMAL HIGH (ref 70–99)
Potassium: 3.5 mEq/L (ref 3.5–5.1)
Sodium: 134 mEq/L — ABNORMAL LOW (ref 135–145)

## 2012-06-09 LAB — LIPID PANEL
Cholesterol: 193 mg/dL (ref 0–200)
HDL: 39.5 mg/dL (ref >39.00)
VLDL: 35.6 mg/dL (ref 0.0–40.0)

## 2012-06-09 MED ORDER — NIFEDIPINE ER OSMOTIC RELEASE 90 MG PO TB24: 90.0000 mg | ORAL_TABLET | Freq: Every day | ORAL | Status: DC

## 2012-06-09 MED ORDER — QUETIAPINE FUMARATE 100 MG PO TABS: 100.0000 mg | ORAL_TABLET | Freq: Every day | ORAL | Status: DC

## 2012-06-09 MED ORDER — BUPROPION HCL ER (SR) 150 MG PO TB12: 150.0000 mg | ORAL_TABLET | Freq: Two times a day (BID) | ORAL | Status: DC

## 2012-06-09 MED ORDER — TRAMADOL HCL 50 MG PO TABS: 50.0000 mg | ORAL_TABLET | Freq: Four times a day (QID) | ORAL | Status: DC | PRN

## 2012-06-09 MED ORDER — LORAZEPAM 0.5 MG PO TABS: 0.5000 mg | ORAL_TABLET | Freq: Three times a day (TID) | ORAL | Status: DC

## 2012-06-09 MED ORDER — TRAZODONE HCL 100 MG PO TABS: 100.0000 mg | ORAL_TABLET | Freq: Every day | ORAL | Status: DC

## 2012-06-09 MED ORDER — LISDEXAMFETAMINE DIMESYLATE 50 MG PO CAPS: 50.0000 mg | ORAL_CAPSULE | ORAL | Status: DC

## 2012-06-09 MED ORDER — ESOMEPRAZOLE MAGNESIUM 40 MG PO CPDR: 40.0000 mg | DELAYED_RELEASE_CAPSULE | Freq: Every day | ORAL | Status: DC

## 2012-06-09 MED ORDER — METOPROLOL SUCCINATE ER 100 MG PO TB24: 100.0000 mg | ORAL_TABLET | Freq: Every day | ORAL | Status: DC

## 2012-06-09 NOTE — Addendum Note (Signed)
Addended by: Arnette Norris on: 06/09/2012 02:31 PM   Modules accepted: Orders, Medications

## 2012-06-09 NOTE — Assessment & Plan Note (Signed)
Stable con't meds 

## 2012-06-09 NOTE — Patient Instructions (Signed)

## 2012-06-09 NOTE — Progress Notes (Signed)
  Subjective:    Patient here for follow-up of elevated blood pressure.  He is not exercising and is adherent to a low-salt diet.  Blood pressure is not checked  at home. Cardiac symptoms: none. Patient denies: chest pain, chest pressure/discomfort, claudication, dyspnea, exertional chest pressure/discomfort, fatigue, irregular heart beat, lower extremity edema, near-syncope, orthopnea, palpitations, paroxysmal nocturnal dyspnea, syncope and tachypnea. Cardiovascular risk factors: hypertension, male gender, obesity (BMI >= 30 kg/m2) and sedentary lifestyle. Use of agents associated with hypertension: none. History of target organ damage: none.  The following portions of the patient's history were reviewed and updated as appropriate: allergies, current medications, past family history, past medical history, past social history, past surgical history and problem list.  Review of Systems Pertinent items are noted in HPI.     Objective:    BP 136/76  Pulse 109  Temp(Src) 98.7 F (37.1 C) (Oral)  Wt 313 lb 12.8 oz (142.339 kg)  BMI 46.32 kg/m2  SpO2 98% General appearance: alert, cooperative, appears stated age and no distress Lungs: clear to auscultation bilaterally Heart: S1, S2 normal Extremities: extremities normal, atraumatic, no cyanosis or edema    Assessment:    Hypertension, stage 1 . Evidence of target organ damage: none.    Plan:    Medication: increase to increase toprol 50 mg. Screening labs for initial evaluation: basic metabolic panel, lipid panel and urinalysis. Regular aerobic exercise. Check blood pressures 2-3 times weekly and record. Follow up: 3 months and as needed.

## 2012-06-09 NOTE — Assessment & Plan Note (Signed)
Pt is off meds---- he thinks He will check his med bottles at home

## 2012-06-09 NOTE — Assessment & Plan Note (Signed)
Elevated with tachycardia---  Inc metoprolol to 100 mg  Check labs

## 2012-07-14 ENCOUNTER — Ambulatory Visit (INDEPENDENT_AMBULATORY_CARE_PROVIDER_SITE_OTHER): Payer: Medicare Other | Admitting: Family Medicine

## 2012-07-14 ENCOUNTER — Encounter: Payer: Self-pay | Admitting: Family Medicine

## 2012-07-14 VITALS — BP 130/84 | HR 85 | Temp 98.3°F | Wt 325.0 lb

## 2012-07-14 DIAGNOSIS — J209 Acute bronchitis, unspecified: Secondary | ICD-10-CM

## 2012-07-14 DIAGNOSIS — J019 Acute sinusitis, unspecified: Secondary | ICD-10-CM

## 2012-07-14 MED ORDER — GUAIFENESIN-CODEINE 100-10 MG/5ML PO SYRP: ORAL_SOLUTION | ORAL | Status: DC

## 2012-07-14 MED ORDER — CEFUROXIME AXETIL 500 MG PO TABS: 500.0000 mg | ORAL_TABLET | Freq: Two times a day (BID) | ORAL | Status: AC

## 2012-07-14 NOTE — Patient Instructions (Addendum)

## 2012-07-14 NOTE — Progress Notes (Signed)
  Subjective:     Luis Boyer is a 22 y.o. male who presents for evaluation of symptoms of a URI. Symptoms include congestion, facial pain, nasal congestion, no  fever and productive cough with  green colored sputum. Onset of symptoms was 3 days ago, and has been gradually worsening since that time. Treatment to date: cough suppressants.  The following portions of the patient's history were reviewed and updated as appropriate: allergies, current medications, past family history, past medical history, past social history, past surgical history and problem list.  Review of Systems Pertinent items are noted in HPI.   Objective:    BP 130/84  Pulse 85  Temp(Src) 98.3 F (36.8 C) (Oral)  Wt 325 lb (147.419 kg)  BMI 47.97 kg/m2  SpO2 97% General appearance: alert, cooperative, appears stated age and no distress Head: Normocephalic, without obvious abnormality, atraumatic Ears: normal TM's and external ear canals both ears Nose: green discharge, moderate congestion, turbinates red, swollen, sinus tenderness bilateral Throat: abnormal findings: mild oropharyngeal erythema and PND Neck: mild anterior cervical adenopathy, supple, symmetrical, trachea midline and thyroid not enlarged, symmetric, no tenderness/mass/nodules Lungs: diminished breath sounds bilaterally Lymph nodes: Cervical, supraclavicular, and axillary nodes normal.   Assessment:    bronchitis and sinusitis   Plan:    Suggested symptomatic OTC remedies. Nasal saline spray for congestion. Ceftin per orders. Nasal steroids per orders. Follow up as needed. Call in 7 days if symptoms aren't resolving.

## 2012-08-14 ENCOUNTER — Other Ambulatory Visit: Payer: Self-pay | Admitting: General Practice

## 2012-08-14 DIAGNOSIS — I10 Essential (primary) hypertension: Secondary | ICD-10-CM

## 2012-08-14 MED ORDER — NIFEDIPINE ER OSMOTIC RELEASE 90 MG PO TB24: 90.0000 mg | ORAL_TABLET | Freq: Every day | ORAL | Status: DC

## 2012-08-14 NOTE — Telephone Encounter (Signed)
Med never filled with [facility]. Canceled and sent to [facility].

## 2012-08-15 ENCOUNTER — Telehealth: Payer: Self-pay | Admitting: General Practice

## 2012-08-15 ENCOUNTER — Other Ambulatory Visit: Payer: Self-pay | Admitting: Family Medicine

## 2012-08-15 DIAGNOSIS — F988 Other specified behavioral and emotional disorders with onset usually occurring in childhood and adolescence: Secondary | ICD-10-CM

## 2012-08-15 MED ORDER — AMPHETAMINE-DEXTROAMPHET ER 20 MG PO CP24: 20.0000 mg | ORAL_CAPSULE | ORAL | Status: DC

## 2012-08-15 NOTE — Telephone Encounter (Signed)
PA for Vyvanse completed online through www.covermymeds on 08-15-2012.

## 2012-08-15 NOTE — Telephone Encounter (Signed)
Pt notified and med placed up front for pick up.

## 2012-08-15 NOTE — Telephone Encounter (Signed)
adderall xr 20 mg

## 2012-08-15 NOTE — Telephone Encounter (Signed)
Vyvanse had been denied, due to not being on formulary. Insurance states that ok for dextroamphetamine-amphetamine, dextroamphetamine, Amphetamine Salt Combo, and methylphenidate. Please advise.

## 2012-08-18 ENCOUNTER — Telehealth: Payer: Self-pay | Admitting: Family Medicine

## 2012-08-18 DIAGNOSIS — I10 Essential (primary) hypertension: Secondary | ICD-10-CM

## 2012-08-18 MED ORDER — METOPROLOL SUCCINATE ER 100 MG PO TB24: 100.0000 mg | ORAL_TABLET | Freq: Every day | ORAL | Status: DC

## 2012-08-18 MED ORDER — METOPROLOL SUCCINATE ER 50 MG PO TB24: 50.0000 mg | ORAL_TABLET | Freq: Every day | ORAL | Status: DC

## 2012-08-18 NOTE — Telephone Encounter (Signed)
Spoke with Crystal at the pharmacy and she stated the patient has been taking 25 mg of the Metoprolol for the last 4 days due to a shipment error. She stated the patient should be taking 50 mg of the Metoprolol. I made her aware the Rx was increase and sent to [facility] on [address] on 06/09/12. She said the patient is not taking the correct dose. I made her aware I will call the patient and discuss and fax the correct Rx to them, she agreed.  I tried to cal the patient. MSg left to call the office     KP

## 2012-08-18 NOTE — Telephone Encounter (Signed)
Spoke with patient and he never started the 100 mg Toprol XL. Dr. Laury Axon advised for the patient to stay on the 50 mg and we will check the BP in a few weeks, patient will call back to schedule. Since the patient has the 25 mg he will take two, and start the 50 when done. I call Crystal back to make her aware and she voiced understanding. Patient will continue 50 mg of Metoprolol.     KP

## 2012-08-18 NOTE — Telephone Encounter (Signed)
Crystal with the Med[employer] Assistance Program is calling to speak with someone about the patient's Toprol XL dose. She can be reached at [phone].

## 2012-11-14 ENCOUNTER — Other Ambulatory Visit: Payer: Self-pay | Admitting: *Deleted

## 2012-11-14 NOTE — Telephone Encounter (Signed)
Patient should still refills available. SW, CMA

## 2012-11-15 ENCOUNTER — Other Ambulatory Visit: Payer: Self-pay | Admitting: *Deleted

## 2012-11-15 DIAGNOSIS — K219 Gastro-esophageal reflux disease without esophagitis: Secondary | ICD-10-CM

## 2012-11-15 MED ORDER — ESOMEPRAZOLE MAGNESIUM 40 MG PO CPDR: 40.0000 mg | DELAYED_RELEASE_CAPSULE | Freq: Every day | ORAL | Status: DC

## 2012-11-15 NOTE — Telephone Encounter (Signed)
Rx refilled for nexium.  Ag cma

## 2013-02-12 ENCOUNTER — Telehealth: Payer: Self-pay

## 2013-02-12 DIAGNOSIS — M549 Dorsalgia, unspecified: Secondary | ICD-10-CM

## 2013-02-12 MED ORDER — TRAMADOL HCL 50 MG PO TABS: 50.0000 mg | ORAL_TABLET | Freq: Four times a day (QID) | ORAL | Status: DC | PRN

## 2013-02-12 NOTE — Telephone Encounter (Signed)
Last seen 07/14/12 and filled 06/09/12 #60 with 2 refills.      KP

## 2013-02-12 NOTE — Telephone Encounter (Signed)
Rx faxed.    KP 

## 2013-02-12 NOTE — Telephone Encounter (Signed)
Refill x1 

## 2013-02-14 ENCOUNTER — Other Ambulatory Visit: Payer: Self-pay

## 2013-02-14 MED ORDER — METOPROLOL SUCCINATE ER 50 MG PO TB24: 50.0000 mg | ORAL_TABLET | Freq: Every day | ORAL | Status: DC

## 2013-04-10 ENCOUNTER — Telehealth: Payer: Self-pay | Admitting: *Deleted

## 2013-04-10 DIAGNOSIS — M549 Dorsalgia, unspecified: Secondary | ICD-10-CM

## 2013-04-10 NOTE — Telephone Encounter (Signed)
Last seen 07/14/12 and filled 02/12/13 360. Please advise     KP

## 2013-04-10 NOTE — Telephone Encounter (Signed)
Patient called and requested a refill for traMADol (ULTRAM) 50 MG tablet   [facility]

## 2013-04-10 NOTE — Telephone Encounter (Signed)
Refill x1---due for ov 

## 2013-04-11 MED ORDER — TRAMADOL HCL 50 MG PO TABS: 50.0000 mg | ORAL_TABLET | Freq: Four times a day (QID) | ORAL | Status: DC | PRN

## 2013-05-17 ENCOUNTER — Other Ambulatory Visit: Payer: Self-pay | Admitting: Family Medicine

## 2013-05-18 ENCOUNTER — Other Ambulatory Visit: Payer: Self-pay

## 2013-05-18 MED ORDER — METOPROLOL SUCCINATE ER 50 MG PO TB24: 50.0000 mg | ORAL_TABLET | Freq: Every day | ORAL | Status: DC

## 2013-05-18 NOTE — Telephone Encounter (Signed)
Rx faxed to [facility] at [phone]   KP

## 2013-05-23 ENCOUNTER — Other Ambulatory Visit: Payer: Self-pay

## 2013-05-23 DIAGNOSIS — I10 Essential (primary) hypertension: Secondary | ICD-10-CM

## 2013-05-23 MED ORDER — NIFEDIPINE ER OSMOTIC RELEASE 90 MG PO TB24: 90.0000 mg | ORAL_TABLET | Freq: Every day | ORAL | Status: DC

## 2013-06-05 ENCOUNTER — Other Ambulatory Visit: Payer: Self-pay | Admitting: Family Medicine

## 2013-06-05 NOTE — Telephone Encounter (Signed)
Last seen 07/14/12 and filled 04/11/13 #60. Please advise     KP

## 2013-07-12 ENCOUNTER — Emergency Department (HOSPITAL_COMMUNITY): Payer: Medicare Other

## 2013-07-12 ENCOUNTER — Emergency Department (HOSPITAL_COMMUNITY)
Admission: EM | Admit: 2013-07-12 | Discharge: 2013-07-12 | Disposition: A | Discharge status: Home / Self Care | Payer: Medicare Other | Attending: Emergency Medicine | Admitting: Emergency Medicine

## 2013-07-12 ENCOUNTER — Encounter (HOSPITAL_COMMUNITY): Payer: Self-pay | Admitting: Emergency Medicine

## 2013-07-12 DIAGNOSIS — R072 Precordial pain: Secondary | ICD-10-CM | POA: Insufficient documentation

## 2013-07-12 DIAGNOSIS — I10 Essential (primary) hypertension: Secondary | ICD-10-CM | POA: Insufficient documentation

## 2013-07-12 DIAGNOSIS — Z7982 Long term (current) use of aspirin: Secondary | ICD-10-CM | POA: Insufficient documentation

## 2013-07-12 DIAGNOSIS — R079 Chest pain, unspecified: Secondary | ICD-10-CM

## 2013-07-12 DIAGNOSIS — J45901 Unspecified asthma with (acute) exacerbation: Secondary | ICD-10-CM | POA: Diagnosis not present

## 2013-07-12 DIAGNOSIS — E669 Obesity, unspecified: Secondary | ICD-10-CM | POA: Diagnosis not present

## 2013-07-12 DIAGNOSIS — F988 Other specified behavioral and emotional disorders with onset usually occurring in childhood and adolescence: Secondary | ICD-10-CM | POA: Insufficient documentation

## 2013-07-12 DIAGNOSIS — F3130 Bipolar disorder, current episode depressed, mild or moderate severity, unspecified: Secondary | ICD-10-CM | POA: Insufficient documentation

## 2013-07-12 DIAGNOSIS — Z79899 Other long term (current) drug therapy: Secondary | ICD-10-CM | POA: Insufficient documentation

## 2013-07-12 LAB — CBC
HEMATOCRIT: 45.9 % (ref 39.0–52.0)
Hemoglobin: 15.5 g/dL (ref 13.0–17.0)
MCH: 28.3 pg (ref 26.0–34.0)
MCHC: 33.8 g/dL (ref 30.0–36.0)
MCV: 83.8 fL (ref 78.0–100.0)
PLATELETS: 278 10*3/uL (ref 150–400)
RBC: 5.48 MIL/uL (ref 4.22–5.81)
RDW: 12.8 % (ref 11.5–15.5)
WBC: 12.1 10*3/uL — ABNORMAL HIGH (ref 4.0–10.5)

## 2013-07-12 LAB — BASIC METABOLIC PANEL
BUN: 10 mg/dL (ref 6–23)
CHLORIDE: 100 meq/L (ref 96–112)
CO2: 24 mEq/L (ref 19–32)
Calcium: 9.9 mg/dL (ref 8.4–10.5)
Creatinine, Ser: 0.81 mg/dL (ref 0.50–1.35)
GFR calc non Af Amer: >90 mL/min (ref >90)
Glucose, Bld: 98 mg/dL (ref 70–99)
Potassium: 4.1 mEq/L (ref 3.7–5.3)
SODIUM: 139 meq/L (ref 137–147)

## 2013-07-12 LAB — PRO B NATRIURETIC PEPTIDE: PRO B NATRI PEPTIDE: 35.6 pg/mL (ref 0–125)

## 2013-07-12 LAB — I-STAT TROPONIN, ED: TROPONIN I, POC: 0 ng/mL (ref 0.00–0.08)

## 2013-07-12 LAB — D-DIMER, QUANTITATIVE (NOT AT ARMC)

## 2013-07-12 MED ORDER — TRAMADOL HCL 50 MG PO TABS
50.0000 mg | ORAL_TABLET | Freq: Four times a day (QID) | ORAL | Status: DC | PRN
Start: 2013-07-12 — End: 2016-06-12

## 2013-07-12 NOTE — ED Notes (Signed)
Pt reports central chest pain "sharp" x 3 days progressively worsening; reports intermittent SOB, nausea, diaphoresis, back pain, weakness, dizziness. States laying flat makes pain better. NAD. Hx: HTN, obesity

## 2013-07-12 NOTE — ED Notes (Signed)
Patient denies any back pain, nausea or shortness of breath at this time.  Patient states that he does have chest pain that shoots down left arm.  Patient is CAOx3.  Family at bedside.

## 2013-07-12 NOTE — Discharge Instructions (Signed)
Workup for the chest pain here was negative no significant findings. Followup with your regular Dr. Zachery Conchake the tramadol as needed for the pain. Return for any newer worse symptoms.

## 2013-07-12 NOTE — ED Provider Notes (Signed)
CSN: [id number]     Arrival date & time 07/12/13  1627 History   First MD Initiated Contact with Patient 07/12/13 1955     Chief Complaint  Patient presents with  . Chest Pain     (Consider location/radiation/quality/duration/timing/severity/associated sxs/prior Treatment) Patient is a 23 y.o. male presenting with chest pain. The history is provided by the patient.  Chest Pain Associated symptoms: shortness of breath   Associated symptoms: no abdominal pain, no back pain, no fever, no headache, no nausea and not vomiting    patient with a complaint of substernal chest pain constantly for the past 3 days. Radiates to his right neck and right arm no nausea vomiting. Associated with some shortness of breath. No history of similar pain. Does not radiate to the back no leg swelling or calf pain. Patient states that the pain is currently 4/10 but sometimes will be 10 out of 10 described as sharp. Patient states that lying flat makes it feel better.  Past Medical History  Diagnosis Date  . Asthma   . Hypertension   . ADD (attention deficit disorder)   . Bipolar 1 disorder   . Depression   . Obesity    History reviewed. No pertinent past surgical history. Family History  Problem Relation Age of Onset  . Cervical cancer Mother   . Diabetes Father   . Stroke Father   . Hypertension Father   . Hyperlipidemia Father   . Hyperlipidemia Brother   . Hypertension Brother   . Breast cancer Paternal Aunt   . Breast cancer Paternal Grandmother   . Colon cancer Neg Hx    History  Substance Use Topics  . Smoking status: Never Smoker   . Smokeless tobacco: Never Used  . Alcohol Use: No    Review of Systems  Constitutional: Negative for fever.  HENT: Negative for congestion.   Eyes: Negative for visual disturbance.  Respiratory: Positive for shortness of breath. Negative for wheezing.   Cardiovascular: Positive for chest pain. Negative for leg swelling.  Gastrointestinal: Negative for  nausea, vomiting and abdominal pain.  Genitourinary: Negative for dysuria.  Musculoskeletal: Negative for back pain.  Skin: Negative for rash.  Neurological: Negative for headaches.  Hematological: Does not bruise/bleed easily.  Psychiatric/Behavioral: Negative for confusion.      Allergies  Celebrex and Ace inhibitors  Home Medications   Prior to Admission medications   Medication Sig Start Date End Date Taking? Authorizing Provider  aspirin 325 MG tablet Take 325 mg by mouth daily.   Yes Historical Provider, MD  esomeprazole (NEXIUM) 40 MG capsule Take 1 capsule (40 mg total) by mouth daily. 11/15/12 11/15/13 Yes Yvonne R Lowne, DO  metoprolol succinate (TOPROL-XL) 50 MG 24 hr tablet Take 1 tablet (50 mg total) by mouth daily. Take with or immediately following a meal. 05/18/13  Yes Yvonne R Lowne, DO  traMADol (ULTRAM) 50 MG tablet TAKE ONE TABLET BY MOUTH EVERY 6 HOURS AS NEEDED 06/05/13  Yes Grayling CongressYvonne R Lowne, DO  amphetamine-dextroamphetamine (ADDERALL XR) 20 MG 24 hr capsule Take 20 mg by mouth daily.    Historical Provider, MD  buPROPion (WELLBUTRIN SR) 150 MG 12 hr tablet Take 1 tablet (150 mg total) by mouth 2 (two) times daily. 06/09/12   Grayling CongressYvonne R Lowne, DO  LORazepam (ATIVAN) 0.5 MG tablet Take 1 tablet (0.5 mg total) by mouth every 8 (eight) hours. 06/09/12   Lelon PerlaYvonne R Lowne, DO  NIFEdipine (PROCARDIA XL) 90 MG 24 hr tablet Take 1  tablet (90 mg total) by mouth daily. 05/23/13 05/23/14  Lelon PerlaYvonne R Lowne, DO  traMADol (ULTRAM) 50 MG tablet Take 1 tablet (50 mg total) by mouth every 6 (six) hours as needed. 07/12/13   Shelda JakesScott W. Winda Summerall, MD  traZODone (DESYREL) 100 MG tablet Take 1 tablet (100 mg total) by mouth at bedtime. 06/09/12   Grayling CongressYvonne R Lowne, DO   BP 143/89  Pulse 96  Temp(Src) 98.3 F (36.8 C) (Oral)  Resp 18  Ht 5\' 11"  (1.803 m)  Wt 342 lb (155.13 kg)  BMI 47.72 kg/m2  SpO2 99% Physical Exam  Nursing note and vitals reviewed. Constitutional: He is oriented to person, place,  and time. He appears well-developed and well-nourished. No distress.  HENT:  Head: Normocephalic and atraumatic.  Mouth/Throat: Oropharynx is clear and moist.  Eyes: Conjunctivae and EOM are normal. Pupils are equal, round, and reactive to light.  Neck: Normal range of motion.  Cardiovascular: Normal rate, regular rhythm and normal heart sounds.   No murmur heard. Pulmonary/Chest: Effort normal and breath sounds normal. No respiratory distress.  Abdominal: Soft. Bowel sounds are normal. There is no tenderness.  Musculoskeletal: Normal range of motion.  Neurological: He is alert and oriented to person, place, and time. No cranial nerve deficit. He exhibits normal muscle tone. Coordination normal.  Skin: Skin is warm. No rash noted.    ED Course  Procedures (including critical care time) Labs Review Labs Reviewed  CBC - Abnormal; Notable for the following:    WBC 12.1 (*)    All other components within normal limits  BASIC METABOLIC PANEL  PRO B NATRIURETIC PEPTIDE  D-DIMER, QUANTITATIVE  I-STAT TROPOININ, ED    Imaging Review Dg Chest 2 View  07/12/2013   CLINICAL DATA:  CHEST PAIN  EXAM: CHEST  2 VIEW  COMPARISON:  None.  FINDINGS: The heart size and mediastinal contours are within normal limits. Both lungs are clear. Mild chronic appearing anterior wedging of lower thoracic and upper lumbar vertebra.  IMPRESSION: No active cardiopulmonary disease.   Electronically Signed   By: Salome HolmesHector  Cooper M.D.   On: 07/12/2013 18:10     EKG Interpretation   Date/Time:  Thursday Jul 12 2013 16:34:36 EDT Ventricular Rate:  110 PR Interval:  124 QRS Duration: 84 QT Interval:  328 QTC Calculation: 443 R Axis:   39 Text Interpretation:  Sinus tachycardia Otherwise normal ECG Confirmed by  Shilo Pauwels  MD, Derotha Fishbaugh ([id number]) on 07/12/2013 8:21:13 PM      MDM   Final diagnoses:  Chest pain    Workup for the chest pain is been constant for the past 3 days negative. EKG without acute changes.  Chest x-ray negative for pneumonia pulmonary edema or pneumothorax. Troponin negative. D-dimer negative for concerns for pulmonary embolus. Noncardiac chest pain now will treat with Tylenol have him followup with his regular Dr.  Barbaraann FasterPatient's tachycardia resolved here.    Shelda JakesScott W. Kishan Wachsmuth, MD 07/12/13 2201

## 2013-07-13 ENCOUNTER — Ambulatory Visit: Payer: Medicare Other | Admitting: Family Medicine

## 2013-07-19 ENCOUNTER — Ambulatory Visit: Payer: Medicare Other | Admitting: Family Medicine

## 2013-08-09 ENCOUNTER — Ambulatory Visit: Payer: Medicare Other | Admitting: Family Medicine

## 2014-01-11 ENCOUNTER — Ambulatory Visit (INDEPENDENT_AMBULATORY_CARE_PROVIDER_SITE_OTHER): Payer: Medicare Other | Admitting: Family Medicine

## 2014-01-11 ENCOUNTER — Encounter: Payer: Self-pay | Admitting: Family Medicine

## 2014-01-11 VITALS — BP 112/88 | HR 80 | Temp 98.7°F | Wt 332.8 lb

## 2014-01-11 DIAGNOSIS — K648 Other hemorrhoids: Secondary | ICD-10-CM

## 2014-01-11 DIAGNOSIS — Z23 Encounter for immunization: Secondary | ICD-10-CM | POA: Diagnosis not present

## 2014-01-11 MED ORDER — HYDROCORTISONE ACE-PRAMOXINE 1-1 % RE FOAM: 1.0000 | Freq: Two times a day (BID) | RECTAL | Status: DC

## 2014-01-11 NOTE — Patient Instructions (Signed)

## 2014-01-11 NOTE — Progress Notes (Signed)
Pt tolerated injection well.  No signs of reaction upon leaving the clinic.   

## 2014-01-11 NOTE — Progress Notes (Signed)
   Subjective:    Patient ID: Luis Boyer, male    DOB: 12/16/1990, 23 y.o.   MRN: [id number]  HPI Pt here c/o blood in toilet----similar to ov 2 years ago.  No pain.  + constipation and straining with BM.  No other complaints.   Review of Systems As above    Objective:   Physical Exam  BP 112/88 mmHg  Pulse 80  Temp(Src) 98.7 F (37.1 C) (Oral)  Wt 332 lb 12.8 oz (150.957 kg)  SpO2 97% General appearance: alert, cooperative, appears stated age and no distress Abdomen: soft, non-tender; bowel sounds normal; no masses,  no organomegaly Rectal: normal tone, normal prostate, no masses or tenderness and soft brown guaiac negative stool noted      Assessment & Plan:  1. Hemorrhoids, internal Inc fiber in diet Drink water If no improvement with med-- refer to GI - hydrocortisone-pramoxine (PROCTOFOAM HC) rectal foam; Place 1 applicator rectally 2 (two) times daily.  Dispense: 10 g; Refill: 0  2. Flu shot given

## 2014-01-11 NOTE — Progress Notes (Signed)
Pre visit review using our clinic review tool, if applicable. No additional management support is needed unless otherwise documented below in the visit note. 

## 2014-01-16 ENCOUNTER — Telehealth: Payer: Self-pay | Admitting: Family Medicine

## 2014-01-16 DIAGNOSIS — I10 Essential (primary) hypertension: Secondary | ICD-10-CM

## 2014-01-16 DIAGNOSIS — F319 Bipolar disorder, unspecified: Secondary | ICD-10-CM

## 2014-01-16 MED ORDER — BUPROPION HCL ER (SR) 150 MG PO TB12: 150.0000 mg | ORAL_TABLET | Freq: Two times a day (BID) | ORAL | Status: DC

## 2014-01-16 MED ORDER — NIFEDIPINE ER OSMOTIC RELEASE 90 MG PO TB24: 90.0000 mg | ORAL_TABLET | Freq: Every day | ORAL | Status: DC

## 2014-01-16 MED ORDER — METOPROLOL SUCCINATE ER 50 MG PO TB24: 50.0000 mg | ORAL_TABLET | Freq: Every day | ORAL | Status: DC

## 2014-01-16 NOTE — Telephone Encounter (Signed)
Caller name: Homero FellersFrank Relation to pt: self Call back number: [phone] Pharmacy: [facility] on [address]  Reason for call:   Patient saw Dr. Laury AxonLowne last Friday and was told that his medications would be called in. Patient states that the pharmacy does not have those prescriptions.

## 2014-04-19 ENCOUNTER — Encounter: Payer: Self-pay | Admitting: Family Medicine

## 2014-04-19 ENCOUNTER — Ambulatory Visit (INDEPENDENT_AMBULATORY_CARE_PROVIDER_SITE_OTHER): Payer: Medicare Other | Admitting: Family Medicine

## 2014-04-19 VITALS — BP 160/102 | HR 107 | Temp 98.6°F | Wt 345.4 lb

## 2014-04-19 DIAGNOSIS — F909 Attention-deficit hyperactivity disorder, unspecified type: Secondary | ICD-10-CM

## 2014-04-19 DIAGNOSIS — I10 Essential (primary) hypertension: Secondary | ICD-10-CM

## 2014-04-19 DIAGNOSIS — R591 Generalized enlarged lymph nodes: Secondary | ICD-10-CM

## 2014-04-19 DIAGNOSIS — F988 Other specified behavioral and emotional disorders with onset usually occurring in childhood and adolescence: Secondary | ICD-10-CM

## 2014-04-19 DIAGNOSIS — F319 Bipolar disorder, unspecified: Secondary | ICD-10-CM

## 2014-04-19 DIAGNOSIS — R599 Enlarged lymph nodes, unspecified: Secondary | ICD-10-CM

## 2014-04-19 DIAGNOSIS — F411 Generalized anxiety disorder: Secondary | ICD-10-CM

## 2014-04-19 DIAGNOSIS — K219 Gastro-esophageal reflux disease without esophagitis: Secondary | ICD-10-CM

## 2014-04-19 LAB — CBC WITH DIFFERENTIAL/PLATELET
BASOS ABS: 0 10*3/uL (ref 0.0–0.1)
BASOS PCT: 0 % (ref 0–1)
Eosinophils Absolute: 0.1 10*3/uL (ref 0.0–0.7)
Eosinophils Relative: 1 % (ref 0–5)
HCT: 42.1 % (ref 39.0–52.0)
Hemoglobin: 14.4 g/dL (ref 13.0–17.0)
LYMPHS PCT: 22 % (ref 12–46)
Lymphs Abs: 2.6 10*3/uL (ref 0.7–4.0)
MCH: 28.2 pg (ref 26.0–34.0)
MCHC: 34.2 g/dL (ref 30.0–36.0)
MCV: 82.4 fL (ref 78.0–100.0)
MPV: 10.1 fL (ref 8.6–12.4)
Monocytes Absolute: 1.1 10*3/uL — ABNORMAL HIGH (ref 0.1–1.0)
Monocytes Relative: 9 % (ref 3–12)
NEUTROS ABS: 8.1 10*3/uL — AB (ref 1.7–7.7)
NEUTROS PCT: 68 % (ref 43–77)
PLATELETS: 270 10*3/uL (ref 150–400)
RBC: 5.11 MIL/uL (ref 4.22–5.81)
RDW: 13.4 % (ref 11.5–15.5)
WBC: 11.9 10*3/uL — ABNORMAL HIGH (ref 4.0–10.5)

## 2014-04-19 LAB — BASIC METABOLIC PANEL
BUN: 13 mg/dL (ref 6–23)
CO2: 23 meq/L (ref 19–32)
Calcium: 9.4 mg/dL (ref 8.4–10.5)
Chloride: 105 mEq/L (ref 96–112)
Creat: 0.73 mg/dL (ref 0.50–1.35)
GLUCOSE: 80 mg/dL (ref 70–99)
Potassium: 4.1 mEq/L (ref 3.5–5.3)
SODIUM: 139 meq/L (ref 135–145)

## 2014-04-19 LAB — LIPID PANEL
CHOL/HDL RATIO: 4.6 ratio
Cholesterol: 169 mg/dL (ref 0–200)
HDL: 37 mg/dL — AB (ref >39)
LDL CALC: 86 mg/dL (ref 0–99)
TRIGLYCERIDES: 230 mg/dL — AB (ref <150)
VLDL: 46 mg/dL — ABNORMAL HIGH (ref 0–40)

## 2014-04-19 LAB — HEPATIC FUNCTION PANEL
ALBUMIN: 4.1 g/dL (ref 3.5–5.2)
ALT: 24 U/L (ref 0–53)
AST: 19 U/L (ref 0–37)
Alkaline Phosphatase: 72 U/L (ref 39–117)
Bilirubin, Direct: 0.1 mg/dL (ref 0.0–0.3)
Indirect Bilirubin: 0.3 mg/dL (ref 0.2–1.2)
Total Bilirubin: 0.4 mg/dL (ref 0.2–1.2)
Total Protein: 7.1 g/dL (ref 6.0–8.3)

## 2014-04-19 LAB — POCT URINALYSIS DIPSTICK
Bilirubin, UA: NEGATIVE
GLUCOSE UA: NEGATIVE
Ketones, UA: NEGATIVE
LEUKOCYTES UA: NEGATIVE
Nitrite, UA: NEGATIVE
Protein, UA: NEGATIVE
RBC UA: NEGATIVE
Spec Grav, UA: 1.025
UROBILINOGEN UA: NEGATIVE
pH, UA: 6

## 2014-04-19 MED ORDER — TRAZODONE HCL 100 MG PO TABS: 100.0000 mg | ORAL_TABLET | Freq: Every day | ORAL | Status: DC

## 2014-04-19 MED ORDER — BUPROPION HCL ER (SR) 150 MG PO TB12: 150.0000 mg | ORAL_TABLET | Freq: Two times a day (BID) | ORAL | Status: DC

## 2014-04-19 MED ORDER — METOPROLOL SUCCINATE ER 50 MG PO TB24: 50.0000 mg | ORAL_TABLET | Freq: Every day | ORAL | Status: DC

## 2014-04-19 MED ORDER — ESOMEPRAZOLE MAGNESIUM 40 MG PO CPDR: 40.0000 mg | DELAYED_RELEASE_CAPSULE | Freq: Every day | ORAL | Status: DC

## 2014-04-19 MED ORDER — NIFEDIPINE ER OSMOTIC RELEASE 90 MG PO TB24: 90.0000 mg | ORAL_TABLET | Freq: Every day | ORAL | Status: DC

## 2014-04-19 MED ORDER — AMPHETAMINE-DEXTROAMPHET ER 20 MG PO CP24: 20.0000 mg | ORAL_CAPSULE | Freq: Every day | ORAL | Status: DC

## 2014-04-19 NOTE — Progress Notes (Signed)
Pre visit review using our clinic review tool, if applicable. No additional management support is needed unless otherwise documented below in the visit note. 

## 2014-04-19 NOTE — Patient Instructions (Addendum)

## 2014-04-19 NOTE — Assessment & Plan Note (Signed)
Elevated-- encouraged pt to take med and importance of not running out  Recheck 2 weeks

## 2014-04-19 NOTE — Progress Notes (Signed)
Subjective:    Patient ID: Luis Boyer, male    DOB: 12/08/1990, 24 y.o.   MRN: [id number]  HPI  Patient here for f/u htn and bipolar.  He also c/o knot L side of neck. No fevers, no recent illness.  Pt has been off meds for months.  No complaints.    Past Medical History  Diagnosis Date  . Asthma   . Hypertension   . ADD (attention deficit disorder)   . Bipolar 1 disorder   . Depression   . Obesity     Review of Systems  Constitutional: Negative for fatigue and unexpected weight change.  Respiratory: Negative for cough and shortness of breath.   Cardiovascular: Negative for chest pain, palpitations and leg swelling.  Psychiatric/Behavioral: Negative for suicidal ideas, hallucinations, behavioral problems, confusion, sleep disturbance, self-injury, dysphoric mood, decreased concentration and agitation. The patient is not nervous/anxious and is not hyperactive.        Objective:    Physical Exam  Constitutional: He is oriented to person, place, and time. He appears well-developed and well-nourished. No distress.  Neck: No thyromegaly present.  Cardiovascular: Normal rate, regular rhythm and normal heart sounds.   Pulmonary/Chest: Effort normal and breath sounds normal. No respiratory distress.  Musculoskeletal: He exhibits no edema.  Lymphadenopathy:    He has no cervical adenopathy.  Neurological: He is alert and oriented to person, place, and time.  Psychiatric: He has a normal mood and affect. His behavior is normal. Judgment and thought content normal.    BP 160/102 mmHg  Pulse 107  Temp(Src) 98.6 F (37 C) (Oral)  Wt 345 lb 6.4 oz (156.672 kg)  SpO2 99% Wt Readings from Last 3 Encounters:  04/19/14 345 lb 6.4 oz (156.672 kg)  01/11/14 332 lb 12.8 oz (150.957 kg)  07/12/13 342 lb (155.13 kg)     Lab Results  Component Value Date   WBC 12.1* 07/12/2013   HGB 15.5 07/12/2013   HCT 45.9 07/12/2013   PLT 278 07/12/2013   GLUCOSE 98 07/12/2013   CHOL 193  06/09/2012   TRIG 178.0* 06/09/2012   HDL 39.50 06/09/2012   LDLCALC 118* 06/09/2012   ALT 31 06/09/2012   AST 24 06/09/2012   NA 139 07/12/2013   K 4.1 07/12/2013   CL 100 07/12/2013   CREATININE 0.81 07/12/2013   BUN 10 07/12/2013   CO2 24 07/12/2013   TSH 0.65 08/24/2011   HGBA1C 5.4 02/10/2006    Dg Chest 2 View  07/12/2013   CLINICAL DATA:  CHEST PAIN  EXAM: CHEST  2 VIEW  COMPARISON:  None.  FINDINGS: The heart size and mediastinal contours are within normal limits. Both lungs are clear. Mild chronic appearing anterior wedging of lower thoracic and upper lumbar vertebra.  IMPRESSION: No active cardiopulmonary disease.   Electronically Signed   By: Salome HolmesHector  Cooper M.D.   On: 07/12/2013 18:10       Assessment & Plan:   Problem List Items Addressed This Visit    Essential hypertension    Elevated-- encouraged pt to take med and importance of not running out  Recheck 2 weeks      Relevant Medications   metoprolol succinate (TOPROL-XL) 24 hr tablet   NIFEdipine (PROCARDIA-XL/ADALAT-CC) 24 hr tablet   Other Relevant Orders   POCT urinalysis dipstick   Basic Metabolic Panel (BMET)   CBC with Differential   Hepatic function panel   Lipid panel   Enlarged lymph nodes    Other  Visit Diagnoses    Bipolar 1 disorder    -  Primary    Relevant Medications    buPROPion (WELLBUTRIN SR) 12 hr tablet    Gastroesophageal reflux disease, esophagitis presence not specified        Relevant Medications    esomeprazole (NEXIUM) capsule    Other Relevant Orders    Basic Metabolic Panel (BMET)    CBC with Differential    Hepatic function panel    Lipid panel    Anxiety state        Relevant Medications    buPROPion (WELLBUTRIN SR) 12 hr tablet    traZODone (DESYREL) tablet    ADD (attention deficit disorder)        Relevant Medications    amphetamine-dextroamphetamine (ADDERALL XR) 24 hr capsule        Loreen Freud, DO

## 2014-05-03 ENCOUNTER — Ambulatory Visit: Payer: Medicare Other | Admitting: Family Medicine

## 2014-09-14 ENCOUNTER — Emergency Department (HOSPITAL_COMMUNITY)
Admission: EM | Admit: 2014-09-14 | Discharge: 2014-09-14 | Disposition: A | Discharge status: Home / Self Care | Payer: Medicare Other | Attending: Emergency Medicine | Admitting: Emergency Medicine

## 2014-09-14 ENCOUNTER — Encounter (HOSPITAL_COMMUNITY): Payer: Self-pay | Admitting: Emergency Medicine

## 2014-09-14 DIAGNOSIS — Z23 Encounter for immunization: Secondary | ICD-10-CM | POA: Insufficient documentation

## 2014-09-14 DIAGNOSIS — S71112A Laceration without foreign body, left thigh, initial encounter: Secondary | ICD-10-CM | POA: Insufficient documentation

## 2014-09-14 DIAGNOSIS — Y998 Other external cause status: Secondary | ICD-10-CM | POA: Insufficient documentation

## 2014-09-14 DIAGNOSIS — Y9389 Activity, other specified: Secondary | ICD-10-CM | POA: Diagnosis not present

## 2014-09-14 DIAGNOSIS — F319 Bipolar disorder, unspecified: Secondary | ICD-10-CM | POA: Insufficient documentation

## 2014-09-14 DIAGNOSIS — Z7952 Long term (current) use of systemic steroids: Secondary | ICD-10-CM | POA: Insufficient documentation

## 2014-09-14 DIAGNOSIS — F909 Attention-deficit hyperactivity disorder, unspecified type: Secondary | ICD-10-CM | POA: Diagnosis not present

## 2014-09-14 DIAGNOSIS — Z79899 Other long term (current) drug therapy: Secondary | ICD-10-CM | POA: Diagnosis not present

## 2014-09-14 DIAGNOSIS — J45909 Unspecified asthma, uncomplicated: Secondary | ICD-10-CM | POA: Insufficient documentation

## 2014-09-14 DIAGNOSIS — E669 Obesity, unspecified: Secondary | ICD-10-CM | POA: Diagnosis not present

## 2014-09-14 DIAGNOSIS — Y92007 Garden or yard of unspecified non-institutional (private) residence as the place of occurrence of the external cause: Secondary | ICD-10-CM | POA: Diagnosis not present

## 2014-09-14 DIAGNOSIS — Y288XXA Contact with other sharp object, undetermined intent, initial encounter: Secondary | ICD-10-CM | POA: Diagnosis not present

## 2014-09-14 DIAGNOSIS — I10 Essential (primary) hypertension: Secondary | ICD-10-CM | POA: Insufficient documentation

## 2014-09-14 MED ORDER — LIDOCAINE-EPINEPHRINE (PF) 2 %-1:200000 IJ SOLN
20.0000 mL | Freq: Once | INTRAMUSCULAR | Status: DC
  Filled 2014-09-14: qty 20

## 2014-09-14 MED ORDER — TETANUS-DIPHTH-ACELL PERTUSSIS 5-2.5-18.5 LF-MCG/0.5 IM SUSP
0.5000 mL | Freq: Once | INTRAMUSCULAR | Status: AC
  Administered 2014-09-14: 0.5 mL via INTRAMUSCULAR
  Filled 2014-09-14: qty 0.5

## 2014-09-14 MED ORDER — BACITRACIN ZINC 500 UNIT/GM EX OINT: 1.0000 "application " | TOPICAL_OINTMENT | Freq: Two times a day (BID) | CUTANEOUS | Status: DC

## 2014-09-14 NOTE — Discharge Instructions (Signed)
Clean the area with soap and water daily. Change your dressing over top of the wound at least once per day. Apply bacitracin as prescribed to prevent infection. Do not soak in water for a long period of time such as during bathing or while swimming in a pool. Return for suture removal in 10-12 days, or sooner if symptoms worsen or signs of infection develop.  Laceration Care, Adult A laceration is a cut or lesion that goes through all layers of the skin and into the tissue just beneath the skin. TREATMENT  Some lacerations may not require closure. Some lacerations may not be able to be closed due to an increased risk of infection. It is important to see your caregiver as soon as possible after an injury to minimize the risk of infection and maximize the opportunity for successful closure. If closure is appropriate, pain medicines may be given, if needed. The wound will be cleaned to help prevent infection. Your caregiver will use stitches (sutures), staples, wound glue (adhesive), or skin adhesive strips to repair the laceration. These tools bring the skin edges together to allow for faster healing and a better cosmetic outcome. However, all wounds will heal with a scar. Once the wound has healed, scarring can be minimized by covering the wound with sunscreen during the day for 1 full year. HOME CARE INSTRUCTIONS  For sutures or staples:  Keep the wound clean and dry.  If you were given a bandage (dressing), you should change it at least once a day. Also, change the dressing if it becomes wet or dirty, or as directed by your caregiver.  Wash the wound with soap and water 2 times a day. Rinse the wound off with water to remove all soap. Pat the wound dry with a clean towel.  After cleaning, apply a thin layer of the antibiotic ointment as recommended by your caregiver. This will help prevent infection and keep the dressing from sticking.  You may shower as usual after the first 24 hours. Do not soak  the wound in water until the sutures are removed.  Only take over-the-counter or prescription medicines for pain, discomfort, or fever as directed by your caregiver.  Get your sutures or staples removed as directed by your caregiver. For skin adhesive strips:  Keep the wound clean and dry.  Do not get the skin adhesive strips wet. You may bathe carefully, using caution to keep the wound dry.  If the wound gets wet, pat it dry with a clean towel.  Skin adhesive strips will fall off on their own. You may trim the strips as the wound heals. Do not remove skin adhesive strips that are still stuck to the wound. They will fall off in time. For wound adhesive:  You may briefly wet your wound in the shower or bath. Do not soak or scrub the wound. Do not swim. Avoid periods of heavy perspiration until the skin adhesive has fallen off on its own. After showering or bathing, gently pat the wound dry with a clean towel.  Do not apply liquid medicine, cream medicine, or ointment medicine to your wound while the skin adhesive is in place. This may loosen the film before your wound is healed.  If a dressing is placed over the wound, be careful not to apply tape directly over the skin adhesive. This may cause the adhesive to be pulled off before the wound is healed.  Avoid prolonged exposure to sunlight or tanning lamps while the skin adhesive  is in place. Exposure to ultraviolet light in the first year will darken the scar.  The skin adhesive will usually remain in place for 5 to 10 days, then naturally fall off the skin. Do not pick at the adhesive film. You may need a tetanus shot if:  You cannot remember when you had your last tetanus shot.  You have never had a tetanus shot. If you get a tetanus shot, your arm may swell, get red, and feel warm to the touch. This is common and not a problem. If you need a tetanus shot and you choose not to have one, there is a rare chance of getting tetanus.  Sickness from tetanus can be serious. SEEK MEDICAL CARE IF:   You have redness, swelling, or increasing pain in the wound.  You see a red line that goes away from the wound.  You have yellowish-white fluid (pus) coming from the wound.  You have a fever.  You notice a bad smell coming from the wound or dressing.  Your wound breaks open before or after sutures have been removed.  You notice something coming out of the wound such as wood or glass.  Your wound is on your hand or foot and you cannot move a finger or toe. SEEK IMMEDIATE MEDICAL CARE IF:   Your pain is not controlled with prescribed medicine.  You have severe swelling around the wound causing pain and numbness or a change in color in your arm, hand, leg, or foot.  Your wound splits open and starts bleeding.  You have worsening numbness, weakness, or loss of function of any joint around or beyond the wound.  You develop painful lumps near the wound or on the skin anywhere on your body. MAKE SURE YOU:   Understand these instructions.  Will watch your condition.  Will get help right away if you are not doing well or get worse. Document Released: 02/22/2005 Document Revised: 05/17/2011 Document Reviewed: 08/18/2010 [facility] Patient Information 2015 [place], [place]. This information is not intended to replace advice given to you by your health care provider. Make sure you discuss any questions you have with your health care provider.

## 2014-09-14 NOTE — ED Provider Notes (Signed)
[id number]     Arrival date & time 09/14/14  2054 History   This chart was scribed for Luis Madura, PA-C working with Toy Cookey, MD by Elveria Rising, ED Scribe. This patient was seen in room WTR9/WTR9 and the patient's care was started at 9:07 PM.   Chief Complaint  Patient presents with  . Extremity Laceration   The history is provided by the patient. No language interpreter was used.   HPI Comments: Luis Boyer is a 24 y.o. male who presents to the Emergency Department with laceration to right medial thigh sustained tonight. Patient explains that he was straddling a fence, in an attempt to clear it to get to his back yard and toppled over, lacerating his thigh. Prior to arrival, patient's girlfriend applied a bandage and pressure wrap to stop the bleeding. Patient denies pain at site currently. Patient endorses Tetanus shot 5-6 years ago.   Past Medical History  Diagnosis Date  . Asthma   . Hypertension   . ADD (attention deficit disorder)   . Bipolar 1 disorder   . Depression   . Obesity    History reviewed. No pertinent past surgical history. Family History  Problem Relation Age of Onset  . Cervical cancer Mother   . Diabetes Father   . Stroke Father   . Hypertension Father   . Hyperlipidemia Father   . Hyperlipidemia Brother   . Hypertension Brother   . Breast cancer Paternal Aunt   . Breast cancer Paternal Grandmother   . Colon cancer Neg Hx    History  Substance Use Topics  . Smoking status: Never Smoker   . Smokeless tobacco: Never Used  . Alcohol Use: No    Review of Systems  Constitutional: Negative for fever and chills.  Skin: Positive for wound.  Neurological: Negative for weakness and numbness.    Allergies  Celebrex and Ace inhibitors  Home Medications   Prior to Admission medications   Medication Sig Start Date End Date Taking? Authorizing Provider  amphetamine-dextroamphetamine (ADDERALL XR) 20 MG 24 hr capsule Take 1 capsule (20 mg  total) by mouth daily. 04/19/14   Lelon Perla, DO  aspirin 325 MG tablet Take 325 mg by mouth daily.    Historical Provider, MD  bacitracin ointment Apply 1 application topically 2 (two) times daily. 09/14/14   Luis Madura, PA-C  buPROPion (WELLBUTRIN SR) 150 MG 12 hr tablet Take 1 tablet (150 mg total) by mouth 2 (two) times daily. 04/19/14   Lelon Perla, DO  esomeprazole (NEXIUM) 40 MG capsule Take 1 capsule (40 mg total) by mouth daily. 04/19/14 06/15/15  Lelon Perla, DO  hydrocortisone-pramoxine (PROCTOFOAM HC) rectal foam Place 1 applicator rectally 2 (two) times daily. 01/11/14   Grayling Congress Lowne, DO  LORazepam (ATIVAN) 0.5 MG tablet Take 1 tablet (0.5 mg total) by mouth every 8 (eight) hours. 06/09/12   Lelon Perla, DO  metoprolol succinate (TOPROL-XL) 50 MG 24 hr tablet Take 1 tablet (50 mg total) by mouth daily. Take with or immediately following a meal. 04/19/14   Lelon Perla, DO  NIFEdipine (PROCARDIA XL) 90 MG 24 hr tablet Take 1 tablet (90 mg total) by mouth daily. 04/19/14 04/19/15  Lelon Perla, DO  traMADol (ULTRAM) 50 MG tablet Take 1 tablet (50 mg total) by mouth every 6 (six) hours as needed. 07/12/13   Vanetta Mulders, MD  traZODone (DESYREL) 100 MG tablet Take 1 tablet (100 mg total) by mouth at  bedtime. 04/19/14   Yvonne R Lowne, DO   BP 164/85 mmHg  Pulse 122  Temp(Src) 99.7 F (37.6 C) (Oral)  Resp 16  SpO2 96%   Physical Exam  Constitutional: He is oriented to person, place, and time. He appears well-developed and well-nourished. No distress.  HENT:  Head: Normocephalic and atraumatic.  Eyes: Conjunctivae and EOM are normal. No scleral icterus.  Neck: Normal range of motion.  Cardiovascular: Normal rate, regular rhythm and intact distal pulses.   DP and PT pulses 2+ in the left lower extremity  Pulmonary/Chest: Effort normal. No respiratory distress.  Musculoskeletal: Normal range of motion.       Left upper leg: He exhibits laceration. He exhibits no tenderness,  no bony tenderness, no swelling and no edema.       Legs: Neurological: He is alert and oriented to person, place, and time. He exhibits normal muscle tone. Coordination normal.  GCS 15. Patient moving all extremities.  Skin: Skin is warm and dry. No rash noted. He is not diaphoretic. No erythema. No pallor.  8 cm laceration noted to upper inner left thigh. Bleeding controlled.  Psychiatric: He has a normal mood and affect. His behavior is normal.  Nursing note and vitals reviewed.   ED Course  Procedures (including critical care time)  LACERATION REPAIR PROCEDURE NOTE The patient's identification was confirmed and consent was obtained. This procedure was performed by Luis MaduraKelly Jahfari Ambers at 10:00 PM. Site: left medial thigh  Sterile procedures observed Anesthetic used (type and amt): Lidocaine  Suture type/size: 3-0 Ethilon  Length: 8cm  # of Sutures: 14 Technique: simple interrupted  Complexity Antibx ointment applied Tetanus UTD or ordered Site anesthetized, irrigated with NS, explored without evidence of foreign body, wound well approximated, site covered with dry, sterile dressing.  Patient tolerated procedure well without complications. Instructions for care discussed verbally and patient provided with additional written instructions for homecare and f/u.   COORDINATION OF CARE: 9:12 PM- Discussed treatment plan with patient at bedside and patient agreed to plan.   Labs Review Labs Reviewed - No data to display  Imaging Review No results found.   EKG Interpretation None      MDM   Final diagnoses:  Thigh laceration, left, initial encounter    Tdap booster given. Laceration occurred < 8 hours prior to repair which was well tolerated. Pt has no comorbidities to effect normal wound healing. Discussed suture home care with pt and answered questions. Pt to follow up for wound check and suture removal in 10-12 days. Return precautions discussed and provided. Patient  agreeable to plan with no unaddressed concerns. Patient discharged in good condition.  I personally performed the services described in this documentation, which was scribed in my presence. The recorded information has been reviewed and is accurate.   Filed Vitals:   09/14/14 2118  BP: 164/85  Pulse: 122  Temp: 99.7 F (37.6 C)  TempSrc: Oral  Resp: 16  SpO2: 96%     Luis MaduraKelly Keerthi Hazell, PA-C 09/14/14 [id number]  Toy CookeyMegan Docherty, MD 09/15/14 1215

## 2014-09-14 NOTE — ED Notes (Signed)
Pt states he was climbing over a fence and fell. Pt has a laceration to the inner L thigh. Bleeding controlled. States last tetanus shot was about 6 yrs ago. Pt ambulatory to exam room with steady gait.

## 2014-09-25 ENCOUNTER — Emergency Department (HOSPITAL_COMMUNITY)
Admission: EM | Admit: 2014-09-25 | Discharge: 2014-09-25 | Disposition: A | Discharge status: Home / Self Care | Payer: Medicare Other | Attending: Emergency Medicine | Admitting: Emergency Medicine

## 2014-09-25 ENCOUNTER — Encounter (HOSPITAL_COMMUNITY): Payer: Self-pay | Admitting: *Deleted

## 2014-09-25 DIAGNOSIS — Z79899 Other long term (current) drug therapy: Secondary | ICD-10-CM | POA: Insufficient documentation

## 2014-09-25 DIAGNOSIS — Z792 Long term (current) use of antibiotics: Secondary | ICD-10-CM | POA: Insufficient documentation

## 2014-09-25 DIAGNOSIS — Z7982 Long term (current) use of aspirin: Secondary | ICD-10-CM | POA: Diagnosis not present

## 2014-09-25 DIAGNOSIS — I10 Essential (primary) hypertension: Secondary | ICD-10-CM | POA: Diagnosis not present

## 2014-09-25 DIAGNOSIS — Z9119 Patient's noncompliance with other medical treatment and regimen: Secondary | ICD-10-CM | POA: Diagnosis not present

## 2014-09-25 DIAGNOSIS — Z4802 Encounter for removal of sutures: Secondary | ICD-10-CM | POA: Diagnosis present

## 2014-09-25 DIAGNOSIS — F909 Attention-deficit hyperactivity disorder, unspecified type: Secondary | ICD-10-CM | POA: Insufficient documentation

## 2014-09-25 DIAGNOSIS — J45909 Unspecified asthma, uncomplicated: Secondary | ICD-10-CM | POA: Insufficient documentation

## 2014-09-25 DIAGNOSIS — Z9114 Patient's other noncompliance with medication regimen: Secondary | ICD-10-CM

## 2014-09-25 DIAGNOSIS — E669 Obesity, unspecified: Secondary | ICD-10-CM | POA: Diagnosis not present

## 2014-09-25 DIAGNOSIS — Z4801 Encounter for change or removal of surgical wound dressing: Secondary | ICD-10-CM | POA: Diagnosis not present

## 2014-09-25 DIAGNOSIS — Z4800 Encounter for change or removal of nonsurgical wound dressing: Secondary | ICD-10-CM | POA: Diagnosis not present

## 2014-09-25 DIAGNOSIS — Z5189 Encounter for other specified aftercare: Secondary | ICD-10-CM

## 2014-09-25 MED ORDER — CEPHALEXIN 500 MG PO CAPS: 500.0000 mg | ORAL_CAPSULE | Freq: Four times a day (QID) | ORAL | Status: DC

## 2014-09-25 NOTE — Discharge Instructions (Signed)
Wound Check Your wound appears healthy today. Your wound will heal gradually over time. Eventually a scar will form that will fade with time. FACTORS THAT AFFECT SCAR FORMATION:  People differ in the severity in which they scar.  Scar severity varies according to location, size, and the traits you inherited from your parents (genetic predisposition).  Irritation to the wound from infection, rubbing, or chemical exposure will increase the amount of scar formation. HOME CARE INSTRUCTIONS   If you were given a dressing, you should change it at least once a day or as instructed by your caregiver. If the bandage sticks, soak it off with a solution of hydrogen peroxide.  If the bandage becomes wet, dirty, or develops a bad smell, change it as soon as possible.  Look for signs of infection.  Only take over-the-counter or prescription medicines for pain, discomfort, or fever as directed by your caregiver. SEEK IMMEDIATE MEDICAL CARE IF:   You have redness, swelling, or increasing pain in the wound.  You notice pus coming from the wound.  You have a fever.  You notice a bad smell coming from the wound or dressing. Document Released: 11/29/2003 Document Revised: 05/17/2011 Document Reviewed: 02/22/2005 [facility] Patient Information 2015 [place], [place]. This information is not intended to replace advice given to you by your health care provider. Make sure you discuss any questions you have with your health care provider.  Hypertension Hypertension, commonly called high blood pressure, is when the force of blood pumping through your arteries is too strong. Your arteries are the blood vessels that carry blood from your heart throughout your body. A blood pressure reading consists of a higher number over a lower number, such as 110/72. The higher number (systolic) is the pressure inside your arteries when your heart pumps. The lower number (diastolic) is the pressure inside your arteries when your  heart relaxes. Ideally you want your blood pressure below 120/80. Hypertension forces your heart to work harder to pump blood. Your arteries may become narrow or stiff. Having hypertension puts you at risk for heart disease, stroke, and other problems.  RISK FACTORS Some risk factors for high blood pressure are controllable. Others are not.  Risk factors you cannot control include:   Race. You may be at higher risk if you are African American.  Age. Risk increases with age.  Gender. Men are at higher risk than women before age 25 years. After age 25, women are at higher risk than men. Risk factors you can control include:  Not getting enough exercise or physical activity.  Being overweight.  Getting too much fat, sugar, calories, or salt in your diet.  Drinking too much alcohol. SIGNS AND SYMPTOMS Hypertension does not usually cause signs or symptoms. Extremely high blood pressure (hypertensive crisis) may cause headache, anxiety, shortness of breath, and nosebleed. DIAGNOSIS  To check if you have hypertension, your health care provider will measure your blood pressure while you are seated, with your arm held at the level of your heart. It should be measured at least twice using the same arm. Certain conditions can cause a difference in blood pressure between your right and left arms. A blood pressure reading that is higher than normal on one occasion does not mean that you need treatment. If one blood pressure reading is high, ask your health care provider about having it checked again. TREATMENT  Treating high blood pressure includes making lifestyle changes and possibly taking medicine. Living a healthy lifestyle can help lower high  blood pressure. You may need to change some of your habits. Lifestyle changes may include:  Following the DASH diet. This diet is high in fruits, vegetables, and whole grains. It is low in salt, red meat, and added sugars.  Getting at least 2 hours of  brisk physical activity every week.  Losing weight if necessary.  Not smoking.  Limiting alcoholic beverages.  Learning ways to reduce stress. If lifestyle changes are not enough to get your blood pressure under control, your health care provider may prescribe medicine. You may need to take more than one. Work closely with your health care provider to understand the risks and benefits. HOME CARE INSTRUCTIONS  Have your blood pressure rechecked as directed by your health care provider.   Take medicines only as directed by your health care provider. Follow the directions carefully. Blood pressure medicines must be taken as prescribed. The medicine does not work as well when you skip doses. Skipping doses also puts you at risk for problems.   Do not smoke.   Monitor your blood pressure at home as directed by your health care provider. SEEK MEDICAL CARE IF:   You think you are having a reaction to medicines taken.  You have recurrent headaches or feel dizzy.  You have swelling in your ankles.  You have trouble with your vision. SEEK IMMEDIATE MEDICAL CARE IF:  You develop a severe headache or confusion.  You have unusual weakness, numbness, or feel faint.  You have severe chest or abdominal pain.  You vomit repeatedly.  You have trouble breathing. MAKE SURE YOU:   Understand these instructions.  Will watch your condition.  Will get help right away if you are not doing well or get worse. Document Released: 02/22/2005 Document Revised: 07/09/2013 Document Reviewed: 12/15/2012 [facility] Patient Information 2015 [place], [place]. This information is not intended to replace advice given to you by your health care provider. Make sure you discuss any questions you have with your health care provider.

## 2014-09-25 NOTE — ED Notes (Signed)
Pt states he needs stitches removed from his left anterior thigh. Pt had 13 stitches placed.

## 2014-09-25 NOTE — ED Notes (Signed)
Per pt, here to get sutures removed from left groin

## 2014-09-25 NOTE — ED Provider Notes (Signed)
CSN: [id number]     Arrival date & time 09/25/14  1606 History  This chart was scribed for Mancel Bale, MD by Elveria Rising, ED scribe.  This patient was seen in room WTR8/WTR8 and the patient's care was started at 5:32 PM.   Chief Complaint  Patient presents with  . Suture / Staple Removal   The history is provided by the patient. No language interpreter was used.   HPI Comments: Luis Boyer is a 24 y.o. male who presents to the Emergency Department requesting to have sutures removed from laceration to left medial thigh sustained 7/9. Patient's 8cm laceration was repaired here in [facility] ED with 14, 3-0 Ethilon sutures. Patient was instructed to follow up for wound care and suture removal in 10-12 days. Patient denies complications with healing. Patient with history of Hypertension states that he has not taken his blood pressure medication, Procardia, since last week. Patient has the medication at home.   Past Medical History  Diagnosis Date  . Asthma   . Hypertension   . ADD (attention deficit disorder)   . Bipolar 1 disorder   . Depression   . Obesity    History reviewed. No pertinent past surgical history. Family History  Problem Relation Age of Onset  . Cervical cancer Mother   . Diabetes Father   . Stroke Father   . Hypertension Father   . Hyperlipidemia Father   . Hyperlipidemia Brother   . Hypertension Brother   . Breast cancer Paternal Aunt   . Breast cancer Paternal Grandmother   . Colon cancer Neg Hx    History  Substance Use Topics  . Smoking status: Never Smoker   . Smokeless tobacco: Never Used  . Alcohol Use: No    Review of Systems  All other systems reviewed and are negative.   Allergies  Celebrex and Ace inhibitors  Home Medications   Prior to Admission medications   Medication Sig Start Date End Date Taking? Authorizing Provider  amphetamine-dextroamphetamine (ADDERALL XR) 20 MG 24 hr capsule Take 1 capsule (20 mg total) by mouth daily. 04/19/14    Lelon Perla, DO  aspirin 325 MG tablet Take 325 mg by mouth daily.    Historical Provider, MD  bacitracin ointment Apply 1 application topically 2 (two) times daily. 09/14/14   Antony Madura, PA-C  buPROPion (WELLBUTRIN SR) 150 MG 12 hr tablet Take 1 tablet (150 mg total) by mouth 2 (two) times daily. 04/19/14   Lelon Perla, DO  cephALEXin (KEFLEX) 500 MG capsule Take 1 capsule (500 mg total) by mouth 4 (four) times daily. 09/25/14   Mancel Bale, MD  esomeprazole (NEXIUM) 40 MG capsule Take 1 capsule (40 mg total) by mouth daily. 04/19/14 06/15/15  Lelon Perla, DO  hydrocortisone-pramoxine (PROCTOFOAM HC) rectal foam Place 1 applicator rectally 2 (two) times daily. 01/11/14   Grayling Congress Lowne, DO  LORazepam (ATIVAN) 0.5 MG tablet Take 1 tablet (0.5 mg total) by mouth every 8 (eight) hours. 06/09/12   Lelon Perla, DO  metoprolol succinate (TOPROL-XL) 50 MG 24 hr tablet Take 1 tablet (50 mg total) by mouth daily. Take with or immediately following a meal. 04/19/14   Lelon Perla, DO  NIFEdipine (PROCARDIA XL) 90 MG 24 hr tablet Take 1 tablet (90 mg total) by mouth daily. 04/19/14 04/19/15  Lelon Perla, DO  traMADol (ULTRAM) 50 MG tablet Take 1 tablet (50 mg total) by mouth every 6 (six) hours as needed. 07/12/13  Vanetta MuldersScott Zackowski, MD  traZODone (DESYREL) 100 MG tablet Take 1 tablet (100 mg total) by mouth at bedtime. 04/19/14   Lelon PerlaYvonne R Lowne, DO   Triage Vitals: BP 182/116 mmHg  Pulse 109  Temp(Src) 98.2 F (36.8 C) (Oral)  Resp 18  SpO2 100% Physical Exam  Constitutional: He is oriented to person, place, and time. He appears well-developed and well-nourished.  HENT:  Head: Normocephalic and atraumatic.  Right Ear: External ear normal.  Left Ear: External ear normal.  Eyes: Conjunctivae and EOM are normal. Pupils are equal, round, and reactive to light.  Neck: Normal range of motion and phonation normal. Neck supple.  Cardiovascular: Normal rate, regular rhythm and normal heart sounds.    Pulmonary/Chest: Effort normal and breath sounds normal. He exhibits no bony tenderness.  Abdominal: Soft. There is no tenderness.  Musculoskeletal: Normal range of motion.  Neurological: He is alert and oriented to person, place, and time. No cranial nerve deficit or sensory deficit. He exhibits normal muscle tone. Coordination normal.  Skin: Skin is warm, dry and intact.  Psychiatric: He has a normal mood and affect. His behavior is normal. Judgment and thought content normal.  Nursing note and vitals reviewed.   ED Course  Procedures (including critical care time)  COORDINATION OF CARE: 5:36 PM- Discussed treatment plan with patient at bedside and patient agreed to plan.    Medications - No data to display  Patient Vitals for the past 24 hrs:  BP Temp Temp src Pulse Resp SpO2  09/25/14 1713 (!) 182/116 mmHg 98.2 F (36.8 C) Oral 109 18 100 %    6:22 PM Reevaluation with update and discussion. After initial assessment and treatment, an updated evaluation reveals,Wound assessed after suture removal indicates mid- aspect dehiscence with mild induration of the wound edges and a small amount of drainage within the wound. There is no Kaylor discharge, or associated fluctuance.There is no significant tenderness on the wound area.Findings discussed with patient, all questions answered. Mancel Bale. Arkie Tagliaferro L     Medications - No data to display  Patient Vitals for the past 24 hrs:  BP Temp Temp src Pulse Resp SpO2  09/25/14 1713 (!) 182/116 mmHg 98.2 F (36.8 C) Oral 109 18 100 %    MDM   Final diagnoses:  Encounter for wound re-check  Essential hypertension  Noncompliance with medication regimen   Wound check, without serious infection.Low amount of drainage in the wound has dehisced, does not indicate wound infection.Patient will likely improve with symptomatic treatment. He wanted to have a prescription for antibodies to start if he does not improve in the next several days. This  is an appropriate wait-and-see approach.  Nursing Notes Reviewed/ Care Coordinated Applicable Imaging Reviewed Interpretation of Laboratory Data incorporated into ED treatment  The patient appears reasonably screened and/or stabilized for discharge and I doubt any other medical condition or other [facility] requiring further screening, evaluation, or treatment in the ED at this time prior to discharge.  Plan: Home Medications- Keflex to start if needed; Home Treatments- Aggressive warm soaks or baths.; return here if the recommended treatment, does not improve the symptoms; Recommended follow up- pCP, when necessary   I personally performed the services described in this documentation, which was scribed in my presence. The recorded information has been reviewed and is accurate.      Mancel BaleElliott Dalena Plantz, MD 09/26/14 1212

## 2015-02-25 ENCOUNTER — Ambulatory Visit: Payer: Self-pay | Admitting: Family Medicine

## 2016-03-30 ENCOUNTER — Telehealth: Payer: Self-pay | Admitting: Family Medicine

## 2016-03-30 NOTE — Telephone Encounter (Signed)
lvm advising patient to schedule medicare wellness appointment.  °

## 2016-06-12 ENCOUNTER — Emergency Department (HOSPITAL_COMMUNITY)
Admission: EM | Admit: 2016-06-12 | Discharge: 2016-06-13 | Disposition: A | Discharge status: Home / Self Care | Payer: Medicare Other | Attending: Emergency Medicine | Admitting: Emergency Medicine

## 2016-06-12 ENCOUNTER — Encounter (HOSPITAL_COMMUNITY): Payer: Self-pay | Admitting: *Deleted

## 2016-06-12 DIAGNOSIS — I10 Essential (primary) hypertension: Secondary | ICD-10-CM | POA: Insufficient documentation

## 2016-06-12 DIAGNOSIS — Z79899 Other long term (current) drug therapy: Secondary | ICD-10-CM | POA: Insufficient documentation

## 2016-06-12 DIAGNOSIS — J45909 Unspecified asthma, uncomplicated: Secondary | ICD-10-CM | POA: Insufficient documentation

## 2016-06-12 DIAGNOSIS — F909 Attention-deficit hyperactivity disorder, unspecified type: Secondary | ICD-10-CM | POA: Insufficient documentation

## 2016-06-12 DIAGNOSIS — R739 Hyperglycemia, unspecified: Secondary | ICD-10-CM | POA: Insufficient documentation

## 2016-06-12 LAB — BLOOD GAS, VENOUS
Acid-Base Excess: 0.3 mmol/L (ref 0.0–2.0)
BICARBONATE: 25.4 mmol/L (ref 20.0–28.0)
FIO2: 0.21
O2 Saturation: 57.7 %
PH VEN: 7.376 (ref 7.250–7.430)
PO2 VEN: 32.8 mmHg (ref 32.0–45.0)
Patient temperature: 98.6
pCO2, Ven: 44.3 mmHg (ref 44.0–60.0)

## 2016-06-12 LAB — BASIC METABOLIC PANEL
ANION GAP: 14 (ref 5–15)
BUN: 11 mg/dL (ref 6–20)
CALCIUM: 9.6 mg/dL (ref 8.9–10.3)
CO2: 24 mmol/L (ref 22–32)
Chloride: 95 mmol/L — ABNORMAL LOW (ref 101–111)
Creatinine, Ser: 1.09 mg/dL (ref 0.61–1.24)
GLUCOSE: 749 mg/dL — AB (ref 65–99)
POTASSIUM: 3.8 mmol/L (ref 3.5–5.1)
Sodium: 133 mmol/L — ABNORMAL LOW (ref 135–145)

## 2016-06-12 LAB — CBG MONITORING, ED
Glucose-Capillary: 553 mg/dL (ref 65–99)
Glucose-Capillary: 427 mg/dL — ABNORMAL HIGH (ref 65–99)

## 2016-06-12 LAB — CBC
HEMATOCRIT: 44.4 % (ref 39.0–52.0)
HEMOGLOBIN: 15.1 g/dL (ref 13.0–17.0)
MCH: 28.7 pg (ref 26.0–34.0)
MCHC: 34 g/dL (ref 30.0–36.0)
MCV: 84.3 fL (ref 78.0–100.0)
PLATELETS: 236 10*3/uL (ref 150–400)
RBC: 5.27 MIL/uL (ref 4.22–5.81)
RDW: 14.2 % (ref 11.5–15.5)
WBC: 12.7 10*3/uL — ABNORMAL HIGH (ref 4.0–10.5)

## 2016-06-12 LAB — URINALYSIS, ROUTINE W REFLEX MICROSCOPIC
BILIRUBIN URINE: NEGATIVE
Glucose, UA: >=500 mg/dL — AB
HGB URINE DIPSTICK: NEGATIVE
Ketones, ur: 20 mg/dL — AB
Leukocytes, UA: NEGATIVE
Nitrite: NEGATIVE
PH: 5 (ref 5.0–8.0)
Protein, ur: NEGATIVE mg/dL
SPECIFIC GRAVITY, URINE: 1.032 — AB (ref 1.005–1.030)
Squamous Epithelial / LPF: NONE SEEN

## 2016-06-12 MED ORDER — SODIUM CHLORIDE 0.9 % IV BOLUS (SEPSIS)
2000.0000 mL | Freq: Once | INTRAVENOUS | Status: AC
  Administered 2016-06-12: 2000 mL via INTRAVENOUS

## 2016-06-12 MED ORDER — SODIUM CHLORIDE 0.9 % IV SOLN
INTRAVENOUS | Status: DC
  Administered 2016-06-12: 4.9 [IU]/h via INTRAVENOUS
  Filled 2016-06-12: qty 2.5

## 2016-06-12 MED ORDER — SODIUM CHLORIDE 0.9 % IV BOLUS (SEPSIS)
1000.0000 mL | Freq: Once | INTRAVENOUS | Status: AC
  Administered 2016-06-12: 1000 mL via INTRAVENOUS

## 2016-06-12 MED ORDER — SODIUM CHLORIDE 0.9 % IV SOLN
INTRAVENOUS | Status: DC
  Administered 2016-06-12: 23:00:00 via INTRAVENOUS

## 2016-06-12 NOTE — ED Provider Notes (Signed)
[facility]-EMERGENCY DEPT Provider Note   CSN: [id number] Arrival date & time: 06/12/16  2003     History   Chief Complaint Chief Complaint  Patient presents with  . Hyperglycemia    HPI Luis Boyer is a 26 y.o. male.Plains of increased thirst and increased urination for 1 week. He denies lightheadedness denies pain anywhere. Denies cough denies fever denies shortness of breath. No treatment prior to coming here. No other associated symptoms. Nothing makes symptoms better or worse.  HPI  Past Medical History:  Diagnosis Date  . ADD (attention deficit disorder)   . Asthma   . Bipolar 1 disorder (HCC)   . Depression   . Hypertension   . Obesity     Patient Active Problem List   Diagnosis Date Noted  . Enlarged lymph nodes 04/19/2014  . Rectal bleed 12/20/2011  . Constipation due to slow transit 12/20/2011  . Angioedema 12/20/2011  . BIPOLAR DISORDER UNSPECIFIED 01/17/2007  . ANGER 11/29/2006  . ADD 11/29/2006  . Essential hypertension 06/08/2006  . ASTHMA 06/08/2006    History reviewed. No pertinent surgical history.     Home Medications    Prior to Admission medications   Medication Sig Start Date End Date Taking? Authorizing Provider  acetaminophen (TYLENOL) 500 MG tablet Take 1,000 mg by mouth every 6 (six) hours as needed (pain).   Yes Historical Provider, MD  esomeprazole (NEXIUM) 20 MG capsule Take 20 mg by mouth daily at 12 noon.   Yes Historical Provider, MD  ibuprofen (ADVIL,MOTRIN) 200 MG tablet Take 400 mg by mouth every 6 (six) hours as needed (pain).   Yes Historical Provider, MD  Nutritional Supplements (COLD AND FLU PO) Take 30 mLs by mouth every 6 (six) hours as needed (sore throat).   Yes Historical Provider, MD  amphetamine-dextroamphetamine (ADDERALL XR) 20 MG 24 hr capsule Take 1 capsule (20 mg total) by mouth daily. Patient not taking: Reported on 06/12/2016 04/19/14   Grayling Congress Lowne Chase, DO  buPROPion [facility] SR) 150 MG 12 hr tablet Take 1  tablet (150 mg total) by mouth 2 (two) times daily. Patient not taking: Reported on 06/12/2016 04/19/14   Lelon Perla Chase, DO  esomeprazole (NEXIUM) 40 MG capsule Take 1 capsule (40 mg total) by mouth daily. 04/19/14 06/15/15  Grayling Congress Lowne Chase, DO  LORazepam (ATIVAN) 0.5 MG tablet Take 1 tablet (0.5 mg total) by mouth every 8 (eight) hours. Patient not taking: Reported on 06/12/2016 06/09/12   Lelon Perla Chase, DO  metoprolol succinate (TOPROL-XL) 50 MG 24 hr tablet Take 1 tablet (50 mg total) by mouth daily. Take with or immediately following a meal. Patient not taking: Reported on 06/12/2016 04/19/14   Lelon Perla Chase, DO  NIFEdipine (PROCARDIA XL) 90 MG 24 hr tablet Take 1 tablet (90 mg total) by mouth daily. Patient not taking: Reported on 06/12/2016 04/19/14 04/19/15  Lelon Perla Chase, DO  traZODone (DESYREL) 100 MG tablet Take 1 tablet (100 mg total) by mouth at bedtime. Patient not taking: Reported on 06/12/2016 04/19/14   Donato Schultz, DO    Family History Family History  Problem Relation Age of Onset  . Cervical cancer Mother   . Diabetes Father   . Stroke Father   . Hypertension Father   . Hyperlipidemia Father   . Hyperlipidemia Brother   . Hypertension Brother   . Breast cancer Paternal Aunt   . Breast cancer Paternal Grandmother   . Colon cancer  Neg Hx     Social History Social History  Substance Use Topics  . Smoking status: Never Smoker  . Smokeless tobacco: Never Used  . Alcohol use No     Allergies   Celebrex [celecoxib] and Ace inhibitors   Review of Systems Review of Systems  Constitutional: Negative.   HENT: Negative.   Respiratory: Negative.   Cardiovascular: Negative.   Gastrointestinal: Negative.   Endocrine: Positive for polydipsia and polyuria.  Musculoskeletal: Negative.   Skin: Negative.   Neurological: Negative.   Psychiatric/Behavioral: Negative.   All other systems reviewed and are negative.    Physical Exam Updated  Vital Signs BP (!) 153/69   Pulse (!) 116   Temp 98.8 F (37.1 C) (Oral)   Resp 15   SpO2 95%   Physical Exam  Constitutional: He appears well-developed and well-nourished.  Mucous membranes dry  HENT:  Head: Normocephalic and atraumatic.  Eyes: Conjunctivae are normal. Pupils are equal, round, and reactive to light.  Neck: Neck supple. No tracheal deviation present. No thyromegaly present.  Cardiovascular: Regular rhythm.   No murmur heard. Mildly tachycardic  Pulmonary/Chest: Effort normal and breath sounds normal.  Abdominal: Soft. Bowel sounds are normal. He exhibits no distension. There is no tenderness.  Morbidly obese  Genitourinary: Penis normal.  Musculoskeletal: Normal range of motion. He exhibits no edema or tenderness.  Neurological: He is alert. Coordination normal.  Skin: Skin is warm and dry. No rash noted.  Psychiatric: He has a normal mood and affect.  Nursing note and vitals reviewed.    ED Treatments / Results  Labs (all labs ordered are listed, but only abnormal results are displayed) Labs Reviewed  CBC - Abnormal; Notable for the following:       Result Value   WBC 12.7 (*)    All other components within normal limits  CBG MONITORING, ED - Abnormal; Notable for the following:    Glucose-Capillary >600 (*)    All other components within normal limits  BASIC METABOLIC PANEL  URINALYSIS, ROUTINE W REFLEX MICROSCOPIC  CBG MONITORING, ED  I-STAT VENOUS BLOOD GAS, ED    EKG  EKG Interpretation None       Radiology No results found.  Procedures Procedures (including critical care time)  Medications Ordered in ED Medications  sodium chloride 0.9 % bolus 2,000 mL (not administered)  sodium chloride 0.9 % bolus 1,000 mL (1,000 mLs Intravenous New Bag/Given 06/12/16 2109)   Results for orders placed or performed during the hospital encounter of 06/12/16  Basic metabolic panel  Result Value Ref Range   Sodium 133 (L) 135 - 145 mmol/L    Potassium 3.8 3.5 - 5.1 mmol/L   Chloride 95 (L) 101 - 111 mmol/L   CO2 24 22 - 32 mmol/L   Glucose, Bld 749 (HH) 65 - 99 mg/dL   BUN 11 6 - 20 mg/dL   Creatinine, Ser 4.09 0.61 - 1.24 mg/dL   Calcium 9.6 8.9 - 81.1 mg/dL   GFR calc non Af Amer >60 >60 mL/min   GFR calc Af Amer >60 >60 mL/min   Anion gap 14 5 - 15  CBC  Result Value Ref Range   WBC 12.7 (H) 4.0 - 10.5 K/uL   RBC 5.27 4.22 - 5.81 MIL/uL   Hemoglobin 15.1 13.0 - 17.0 g/dL   HCT 91.4 78.2 - 95.6 %   MCV 84.3 78.0 - 100.0 fL   MCH 28.7 26.0 - 34.0 pg   MCHC 34.0 30.0 -  36.0 g/dL   RDW 16.1 09.6 - 04.5 %   Platelets 236 150 - 400 K/uL  Urinalysis, Routine w reflex microscopic  Result Value Ref Range   Color, Urine STRAW (A) YELLOW   APPearance CLEAR CLEAR   Specific Gravity, Urine 1.032 (H) 1.005 - 1.030   pH 5.0 5.0 - 8.0   Glucose, UA >=500 (A) NEGATIVE mg/dL   Hgb urine dipstick NEGATIVE NEGATIVE   Bilirubin Urine NEGATIVE NEGATIVE   Ketones, ur 20 (A) NEGATIVE mg/dL   Protein, ur NEGATIVE NEGATIVE mg/dL   Nitrite NEGATIVE NEGATIVE   Leukocytes, UA NEGATIVE NEGATIVE   RBC / HPF 0-5 0 - 5 RBC/hpf   WBC, UA 0-5 0 - 5 WBC/hpf   Bacteria, UA RARE (A) NONE SEEN   Squamous Epithelial / LPF NONE SEEN NONE SEEN  Blood gas, venous  Result Value Ref Range   FIO2 0.21    pH, Ven 7.376 7.250 - 7.430   pCO2, Ven 44.3 44.0 - 60.0 mmHg   pO2, Ven 32.8 32.0 - 45.0 mmHg   Bicarbonate 25.4 20.0 - 28.0 mmol/L   Acid-Base Excess 0.3 0.0 - 2.0 mmol/L   O2 Saturation 57.7 %   Patient temperature 98.6    Collection site VENOUS    Drawn by COLLECTED BY NURSE    Sample type VENOUS   CBG monitoring, ED  Result Value Ref Range   Glucose-Capillary >600 (HH) 65 - 99 mg/dL  CBG monitoring, ED  Result Value Ref Range   Glucose-Capillary 553 (HH) 65 - 99 mg/dL   Comment 1 Notify RN   CBG monitoring, ED  Result Value Ref Range   Glucose-Capillary 427 (H) 65 - 99 mg/dL  CBG monitoring, ED  Result Value Ref Range    Glucose-Capillary 395 (H) 65 - 99 mg/dL   Comment 1 Notify RN    No results found.   Initial Impression / Assessment and Plan / ED Course  I have reviewed the triage vital signs and the nursing notes.  Pertinent labs & imaging results that were available during my care of the patient were reviewed by me and considered in my medical decision making (see chart for details).     12:40 AM patient resting comfortably. No distress. Blood sugars coming down on intravenous glucose and with intravenous normal saline.pt signed out to Dr. Freida Busman 12:45 AM anticipate discharge. Prescription for metformin. Follow-up with Dr.Lowne as outpatient  Final Clinical Impressions(s) / ED Diagnoses  Dx Hyperglycemia Final diagnoses:  None    New Prescriptions New Prescriptions   No medications on file     Doug Sou, MD 06/13/16 [phone]

## 2016-06-12 NOTE — ED Notes (Signed)
ED Provider at bedside. 

## 2016-06-12 NOTE — ED Triage Notes (Signed)
Pt states he has had dry mouth, frequent urination and thirst for the past couple of days. Pt bought a blood glucose monitor, which showed his glucose too high to read. Pt has not been diagnosed with diabetes, states diabetes runs in his family. Pt also complains of upper abdominal pain, denies n/v/d

## 2016-06-13 ENCOUNTER — Encounter (HOSPITAL_COMMUNITY): Payer: Self-pay | Admitting: Oncology

## 2016-06-13 ENCOUNTER — Emergency Department (HOSPITAL_COMMUNITY)
Admission: EM | Admit: 2016-06-13 | Discharge: 2016-06-13 | Disposition: A | Discharge status: Home / Self Care | Payer: Medicare Other | Attending: Emergency Medicine | Admitting: Emergency Medicine

## 2016-06-13 DIAGNOSIS — J45909 Unspecified asthma, uncomplicated: Secondary | ICD-10-CM | POA: Insufficient documentation

## 2016-06-13 DIAGNOSIS — I10 Essential (primary) hypertension: Secondary | ICD-10-CM | POA: Insufficient documentation

## 2016-06-13 DIAGNOSIS — N481 Balanitis: Secondary | ICD-10-CM | POA: Insufficient documentation

## 2016-06-13 DIAGNOSIS — F909 Attention-deficit hyperactivity disorder, unspecified type: Secondary | ICD-10-CM | POA: Insufficient documentation

## 2016-06-13 LAB — I-STAT CHEM 8, ED
BUN: 6 mg/dL (ref 6–20)
CHLORIDE: 102 mmol/L (ref 101–111)
CREATININE: 0.9 mg/dL (ref 0.61–1.24)
Calcium, Ion: 1.11 mmol/L — ABNORMAL LOW (ref 1.15–1.40)
Glucose, Bld: 322 mg/dL — ABNORMAL HIGH (ref 65–99)
HEMATOCRIT: 47 % (ref 39.0–52.0)
HEMOGLOBIN: 16 g/dL (ref 13.0–17.0)
POTASSIUM: 3.4 mmol/L — AB (ref 3.5–5.1)
SODIUM: 141 mmol/L (ref 135–145)
TCO2: 27 mmol/L (ref 0–100)

## 2016-06-13 LAB — CBG MONITORING, ED
GLUCOSE-CAPILLARY: 350 mg/dL — AB (ref 65–99)
GLUCOSE-CAPILLARY: 395 mg/dL — AB (ref 65–99)
Glucose-Capillary: 334 mg/dL — ABNORMAL HIGH (ref 65–99)
Glucose-Capillary: 305 mg/dL — ABNORMAL HIGH (ref 65–99)

## 2016-06-13 MED ORDER — METFORMIN HCL 500 MG PO TABS: 500.0000 mg | ORAL_TABLET | Freq: Two times a day (BID) | ORAL | 0 refills | Status: DC

## 2016-06-13 MED ORDER — HYDROCORTISONE 1 % EX CREA: TOPICAL_CREAM | CUTANEOUS | 0 refills | Status: DC

## 2016-06-13 MED ORDER — POTASSIUM CHLORIDE CRYS ER 20 MEQ PO TBCR
40.0000 meq | EXTENDED_RELEASE_TABLET | Freq: Once | ORAL | Status: AC
  Administered 2016-06-13: 40 meq via ORAL
  Filled 2016-06-13: qty 2

## 2016-06-13 NOTE — ED Triage Notes (Signed)
Pt states that he was taking a shower and washing his penis when he felt a, "twisting pain."  Per pt's s/o pt penis does not look normal to her.  Pt's penis has a white discharge and does appear deformed.  Pt rates pain 10/10.  Denies unprotected sex or new partners.

## 2016-06-13 NOTE — ED Provider Notes (Signed)
Patient signed out to me for treatment of his hyperglycemia. Blood sugar has been improving. Repeat electrolytes show potassium 3.4. Will give 40 of K Dur here before the patient leaves. Also prescribed metformin and he has been given follow-up information.   Lorre Nick, MD 06/13/16 [phone]

## 2016-06-13 NOTE — ED Notes (Signed)
ED Provider at bedside. 

## 2016-06-13 NOTE — ED Provider Notes (Signed)
[facility]-EMERGENCY DEPT Provider Note   CSN: [id number] Arrival date & time: 06/13/16  0441     History   Chief Complaint Chief Complaint  Patient presents with  . Penis Pain    HPI Luis Boyer is a 26 y.o. male.  26 year old male comes in complaining of penile swelling when he went home and wash his genitals. Denies any pruritus. Denies any trauma. No discharge. No treatment use prior to arrival. Nothing made her symptoms better or worse.      Past Medical History:  Diagnosis Date  . ADD (attention deficit disorder)   . Asthma   . Bipolar 1 disorder (HCC)   . Depression   . Hypertension   . Obesity     Patient Active Problem List   Diagnosis Date Noted  . Enlarged lymph nodes 04/19/2014  . Rectal bleed 12/20/2011  . Constipation due to slow transit 12/20/2011  . Angioedema 12/20/2011  . BIPOLAR DISORDER UNSPECIFIED 01/17/2007  . ANGER 11/29/2006  . ADD 11/29/2006  . Essential hypertension 06/08/2006  . ASTHMA 06/08/2006    History reviewed. No pertinent surgical history.     Home Medications    Prior to Admission medications   Medication Sig Start Date End Date Taking? Authorizing Provider  acetaminophen (TYLENOL) 500 MG tablet Take 1,000 mg by mouth every 6 (six) hours as needed (pain).    Historical Provider, MD  amphetamine-dextroamphetamine (ADDERALL XR) 20 MG 24 hr capsule Take 1 capsule (20 mg total) by mouth daily. Patient not taking: Reported on 06/12/2016 04/19/14   Grayling Congress Lowne Chase, DO  buPROPion [facility] SR) 150 MG 12 hr tablet Take 1 tablet (150 mg total) by mouth 2 (two) times daily. Patient not taking: Reported on 06/12/2016 04/19/14   Lelon Perla Chase, DO  esomeprazole (NEXIUM) 20 MG capsule Take 20 mg by mouth daily at 12 noon.    Historical Provider, MD  esomeprazole (NEXIUM) 40 MG capsule Take 1 capsule (40 mg total) by mouth daily. 04/19/14 06/15/15  Lelon Perla Chase, DO  ibuprofen (ADVIL,MOTRIN) 200 MG tablet Take 400 mg by mouth  every 6 (six) hours as needed (pain).    Historical Provider, MD  LORazepam (ATIVAN) 0.5 MG tablet Take 1 tablet (0.5 mg total) by mouth every 8 (eight) hours. Patient not taking: Reported on 06/12/2016 06/09/12   Lelon Perla Chase, DO  metFORMIN (GLUCOPHAGE) 500 MG tablet Take 1 tablet (500 mg total) by mouth 2 (two) times daily with a meal. 06/13/16   Doug Sou, MD  metoprolol succinate (TOPROL-XL) 50 MG 24 hr tablet Take 1 tablet (50 mg total) by mouth daily. Take with or immediately following a meal. Patient not taking: Reported on 06/12/2016 04/19/14   Lelon Perla Chase, DO  NIFEdipine (PROCARDIA XL) 90 MG 24 hr tablet Take 1 tablet (90 mg total) by mouth daily. Patient not taking: Reported on 06/12/2016 04/19/14 04/19/15  Lelon Perla Chase, DO  Nutritional Supplements (COLD AND FLU PO) Take 30 mLs by mouth every 6 (six) hours as needed (sore throat).    Historical Provider, MD  traZODone (DESYREL) 100 MG tablet Take 1 tablet (100 mg total) by mouth at bedtime. Patient not taking: Reported on 06/12/2016 04/19/14   Donato Schultz, DO    Family History Family History  Problem Relation Age of Onset  . Cervical cancer Mother   . Diabetes Father   . Stroke Father   . Hypertension Father   . Hyperlipidemia Father   .  Hyperlipidemia Brother   . Hypertension Brother   . Breast cancer Paternal Aunt   . Breast cancer Paternal Grandmother   . Colon cancer Neg Hx     Social History Social History  Substance Use Topics  . Smoking status: Never Smoker  . Smokeless tobacco: Never Used  . Alcohol use No     Allergies   Celebrex [celecoxib] and Ace inhibitors   Review of Systems Review of Systems  All other systems reviewed and are negative.    Physical Exam Updated Vital Signs BP (!) 151/95 (BP Location: Right Arm)   Pulse (!) 118   Temp 98.8 F (37.1 C) (Oral)   Resp 20   Ht 6' (1.829 m)   Wt (!) 152 kg   SpO2 95%   BMI 45.43 kg/m   Physical Exam    Constitutional: He is oriented to person, place, and time. He appears well-developed and well-nourished.  Non-toxic appearance. No distress.  HENT:  Head: Normocephalic and atraumatic.  Eyes: Conjunctivae, EOM and lids are normal. Pupils are equal, round, and reactive to light.  Neck: Normal range of motion. Neck supple. No tracheal deviation present. No thyroid mass present.  Cardiovascular: Regular rhythm and normal heart sounds.  Tachycardia present.  Exam reveals no gallop.   No murmur heard. Pulmonary/Chest: Effort normal and breath sounds normal. No stridor. No respiratory distress. He has no decreased breath sounds. He has no wheezes. He has no rhonchi. He has no rales.  Abdominal: Soft. Normal appearance and bowel sounds are normal. He exhibits no distension. There is no tenderness. There is no rebound and no CVA tenderness.  Genitourinary: Uncircumcised.     Musculoskeletal: Normal range of motion. He exhibits no edema or tenderness.  Neurological: He is alert and oriented to person, place, and time. He has normal strength. No cranial nerve deficit or sensory deficit. GCS eye subscore is 4. GCS verbal subscore is 5. GCS motor subscore is 6.  Skin: Skin is warm and dry. No abrasion and no rash noted.  Psychiatric: He has a normal mood and affect. His speech is normal and behavior is normal.  Nursing note and vitals reviewed.    ED Treatments / Results  Labs (all labs ordered are listed, but only abnormal results are displayed) Labs Reviewed  CBG MONITORING, ED - Abnormal; Notable for the following:       Result Value   Glucose-Capillary 350 (*)    All other components within normal limits    EKG  EKG Interpretation None       Radiology No results found.  Procedures Procedures (including critical care time)  Medications Ordered in ED Medications - No data to display   Initial Impression / Assessment and Plan / ED Course  I have reviewed the triage vital signs  and the nursing notes.  Pertinent labs & imaging results that were available during my care of the patient were reviewed by me and considered in my medical decision making (see chart for details).     Patient chronically has tachycardia. Which is treated here for new onset type 2 diabetes. Patient likely has balanitis from an allergic source and will prescribe topical steroids.  Final Clinical Impressions(s) / ED Diagnoses   Final diagnoses:  None    New Prescriptions New Prescriptions   No medications on file     Lorre Nick, MD 06/13/16 [phone]

## 2016-06-13 NOTE — Discharge Instructions (Signed)
Take the medication as prescribed. Call Dr.Lowne on Monday 06/14/16 to schedule next available office appointment

## 2016-06-14 ENCOUNTER — Ambulatory Visit (INDEPENDENT_AMBULATORY_CARE_PROVIDER_SITE_OTHER): Payer: Self-pay | Admitting: Family Medicine

## 2016-06-14 ENCOUNTER — Telehealth: Payer: Self-pay | Admitting: Family Medicine

## 2016-06-14 ENCOUNTER — Encounter: Payer: Self-pay | Admitting: Family Medicine

## 2016-06-14 VITALS — BP 156/90 | HR 113 | Temp 98.3°F | Ht 72.0 in | Wt 367.4 lb

## 2016-06-14 DIAGNOSIS — Z23 Encounter for immunization: Secondary | ICD-10-CM

## 2016-06-14 DIAGNOSIS — I10 Essential (primary) hypertension: Secondary | ICD-10-CM

## 2016-06-14 DIAGNOSIS — E1169 Type 2 diabetes mellitus with other specified complication: Secondary | ICD-10-CM

## 2016-06-14 DIAGNOSIS — E669 Obesity, unspecified: Secondary | ICD-10-CM

## 2016-06-14 LAB — COMPREHENSIVE METABOLIC PANEL
ALT: 34 U/L (ref 0–53)
AST: 39 U/L — ABNORMAL HIGH (ref 0–37)
Albumin: 4.5 g/dL (ref 3.5–5.2)
Alkaline Phosphatase: 99 U/L (ref 39–117)
BUN: 7 mg/dL (ref 6–23)
CHLORIDE: 104 meq/L (ref 96–112)
CO2: 13 meq/L — AB (ref 19–32)
Calcium: 9.7 mg/dL (ref 8.4–10.5)
Creatinine, Ser: 0.84 mg/dL (ref 0.40–1.50)
GFR: 116.85 mL/min (ref >60.00)
GLUCOSE: 369 mg/dL — AB (ref 70–99)
POTASSIUM: 4.2 meq/L (ref 3.5–5.1)
Sodium: 134 mEq/L — ABNORMAL LOW (ref 135–145)
TOTAL PROTEIN: 7.9 g/dL (ref 6.0–8.3)
Total Bilirubin: 0.6 mg/dL (ref 0.2–1.2)

## 2016-06-14 LAB — MICROALBUMIN / CREATININE URINE RATIO
CREATININE, U: 70.2 mg/dL
MICROALB/CREAT RATIO: 59.1 mg/g — AB (ref 0.0–30.0)
Microalb, Ur: 41.5 mg/dL — ABNORMAL HIGH (ref 0.0–1.9)

## 2016-06-14 LAB — HEMOGLOBIN A1C: Hgb A1c MFr Bld: 12.2 % — ABNORMAL HIGH (ref 4.6–6.5)

## 2016-06-14 MED ORDER — CHLORTHALIDONE 25 MG PO TABS: 25.0000 mg | ORAL_TABLET | Freq: Every day | ORAL | 2 refills | Status: DC

## 2016-06-14 NOTE — Patient Instructions (Addendum)
If you do not hear anything about your eye doctor referral in the next 1-2 weeks, call our office and ask for an update.  2-3 days for labs to return. We will go over them in 2 weeks for your follow up.  Metformin- continue to take 1 tab twice daily for next week. After that, take 2 in morning and one in afternoon for a week. Then take 2 tabs twice daily. A new rx will be called in for 1000 mg so you will take 1 tab twice daily at that time.  Check your sugars 2-4 times per week. Write them down and bring them to your diabetes appointments.  It might not be a bad idea to check blood pressure at home and get a cuff. An upper arm cuff is desirable.   Around 3 times per week, check your blood pressure 4 times per day. Twice in the morning and twice in the evening. The readings should be at least one minute apart. Write down these values and bring them to your next nurse visit/appointment.  When you check your BP, make sure you have been doing something calm/relaxing 5 minutes prior to checking. Both feet should be flat on the floor and you should be sitting. Use your left arm and make sure it is in a relaxed position (on a table), and that the cuff is at the approximate level/height of your heart.  Your goal blood pressure is 130/79 or less.  Healthy Eating Plan Many factors influence your heart health, including eating and exercise habits. Heart (coronary) risk increases with abnormal blood fat (lipid) levels. Heart-healthy meal planning includes limiting unhealthy fats, increasing healthy fats, and making other small dietary changes. This includes maintaining a healthy body weight to help keep lipid levels within a normal range.  WHAT IS MY PLAN?  Your health care provider recommends that you:  Drink a glass of water before meals to help with satiety.  Eat slowly.  An alternative to the water is to add Metamucil. This will help with satiety as well. It does contain calories, unlike  water.  WHAT TYPES OF FAT SHOULD I CHOOSE?  Choose healthy fats more often. Choose monounsaturated and polyunsaturated fats, such as olive oil and canola oil, flaxseeds, walnuts, almonds, and seeds.  Eat more omega-3 fats. Good choices include salmon, mackerel, sardines, tuna, flaxseed oil, and ground flaxseeds. Aim to eat fish at least two times each week.  Avoid foods with partially hydrogenated oils in them. These contain trans fats. Examples of foods that contain trans fats are stick margarine, some tub margarines, cookies, crackers, and other baked goods. If you are going to avoid a fat, this is the one to avoid!  WHAT GENERAL GUIDELINES DO I NEED TO FOLLOW?  Check food labels carefully to identify foods with trans fats. Avoid these types of options when possible.  Fill one half of your plate with vegetables and green salads. Eat 4-5 servings of vegetables per day. A serving of vegetables equals 1 cup of raw leafy vegetables,  cup of raw or cooked cut-up vegetables, or  cup of vegetable juice.  Fill one fourth of your plate with whole grains. Look for the word "whole" as the first word in the ingredient list.  Fill one fourth of your plate with lean protein foods.  Eat 4-5 servings of fruit per day. A serving of fruit equals one medium whole fruit,  cup of dried fruit,  cup of fresh, frozen, or canned fruit. Try  to avoid fruits in cups/syrups as the sugar content can be high.  Eat more foods that contain soluble fiber. Examples of foods that contain this type of fiber are apples, broccoli, carrots, beans, peas, and barley. Aim to get 20-30 g of fiber per day.  Eat more home-cooked food and less restaurant, buffet, and fast food.  Limit or avoid alcohol.  Limit foods that are high in starch and sugar.  Avoid fried foods when able.  Cook foods by using methods other than frying. Baking, boiling, grilling, and broiling are all great options. Other fat-reducing suggestions  include: ? Removing the skin from poultry. ? Removing all visible fats from meats. ? Skimming the fat off of stews, soups, and gravies before serving them. ? Steaming vegetables in water or broth.  Lose weight if you are overweight. Losing just 5-10% of your initial body weight can help your overall health and prevent diseases such as diabetes and heart disease.  Increase your consumption of nuts, legumes, and seeds to 4-5 servings per week. One serving of dried beans or legumes equals  cup after being cooked, one serving of nuts equals 1 ounces, and one serving of seeds equals  ounce or 1 tablespoon.  WHAT ARE GOOD FOODS CAN I EAT? Grains Grainy breads (try to find bread that is 3 g of fiber per slice or greater), oatmeal, light popcorn. Whole-grain cereals. Rice and pasta, including brown rice and those that are made with whole wheat. Edamame pasta is a great alternative to grain pasta. It has a higher protein content. Try to avoid significant consumption of white bread, sugary cereals, or pastries/baked goods.  Vegetables All vegetables. Cooked white potatoes do not count as vegetables.  Fruits All fruits, but limit pineapple and bananas as these fruits have a higher sugar content.  Meats and Other Protein Sources Lean, well-trimmed beef, veal, pork, and lamb. Chicken and [place] without skin. All fish and shellfish. Wild duck, rabbit, pheasant, and venison. Egg whites or low-cholesterol egg substitutes. Dried beans, peas, lentils, and tofu.Seeds and most nuts.  Dairy Low-fat or nonfat cheeses, including ricotta, string, and mozzarella. Skim or 1% milk that is liquid, powdered, or evaporated. Buttermilk that is made with low-fat milk. Nonfat or low-fat yogurt. Soy/Almond milk are good alternatives if you cannot handle dairy.  Beverages Water is the best for you. Sports drinks with less sugar are more desirable unless you are a highly active athlete.  Sweets and Desserts Sherbets  and fruit ices. Honey, jam, marmalade, jelly, and syrups. Dark chocolate.  Eat all sweets and desserts in moderation.  Fats and Oils Nonhydrogenated (trans-free) margarines. Vegetable oils, including soybean, sesame, sunflower, olive, peanut, safflower, corn, canola, and cottonseed. Salad dressings or mayonnaise that are made with a vegetable oil. Limit added fats and oils that you use for cooking, baking, salads, and as spreads.  Other Cocoa powder. Coffee and tea. Most condiments.  The items listed above may not be a complete list of recommended foods or beverages. Contact your dietitian for more options.

## 2016-06-14 NOTE — Addendum Note (Signed)
Addended by: Verdie Shire on: 06/14/2016 11:21 AM   Modules accepted: Orders

## 2016-06-14 NOTE — Progress Notes (Addendum)
Chief Complaint  Patient presents with  . Follow-up    on Sat and pt was dx with DM and HTN    Subjective: Patient is a 26 y.o. male here for ER f/u and new dx of DM.  Patient went to the ED on 06/12/16 was diagnosed with balanitis. He is also found to have a blood glucose reading of 749. He's been thirstier and having frequent urination as well. He does meet criteria for the diagnosis of type 2 diabetes. Minimally, his diet is poor he does not exercise routinely. He was started on metformin 500 mg twice daily the emergency department. His father has diabetes. He has never had a pneumonia vaccine.   He has also been told that he has high blood pressure. He does not routinely monitor BP at home and does not have a monitor. He used to be on metoprolol and Procardia. He has not been seen in over one year and has not been taking this medicine routinely.   ROS: Heart: Denies chest pain  Lungs: Denies SOB   Family History  Problem Relation Age of Onset  . Cervical cancer Mother   . Diabetes Father   . Stroke Father   . Hypertension Father   . Hyperlipidemia Father   . Hyperlipidemia Brother   . Hypertension Brother   . Breast cancer Paternal Aunt   . Breast cancer Paternal Grandmother   . Colon cancer Neg Hx    Past Medical History:  Diagnosis Date  . ADD (attention deficit disorder)   . Asthma   . Bipolar 1 disorder (HCC)   . Depression   . Hypertension   . Obesity    Allergies  Allergen Reactions  . Celebrex [Celecoxib] Shortness Of Breath  . Ace Inhibitors Other (See Comments)    angioedema    Current Outpatient Prescriptions:  .  hydrocortisone cream 1 %, Apply to affected area 2 times daily, Disp: 15 g, Rfl: 0 .  metFORMIN (GLUCOPHAGE) 500 MG tablet, Take 1 tablet (500 mg total) by mouth 2 (two) times daily with a meal., Disp: 60 tablet, Rfl: 0 .  chlorthalidone (HYGROTON) 25 MG tablet, Take 1 tablet (25 mg total) by mouth daily., Disp: 30 tablet, Rfl: 2 .  ibuprofen  (ADVIL,MOTRIN) 200 MG tablet, Take 400 mg by mouth every 6 (six) hours as needed (pain)., Disp: , Rfl:   Objective: BP (!) 156/90 (BP Location: Left Arm, Patient Position: Sitting, Cuff Size: Large)   Pulse (!) 113   Temp 98.3 F (36.8 C) (Oral)   Ht 6' (1.829 m)   Wt (!) 367 lb 6.4 oz (166.7 kg)   SpO2 98%   BMI 49.83 kg/m  General: Awake, appears stated age HEENT: MMM, EOMi, sclera white Heart: RRR, no bruits, no LE edema Lungs: CTAB, no rales, wheezes or rhonchi. No accessory muscle use Psych: Age appropriate judgment and insight, normal affect and mood  Assessment and Plan: Diabetes mellitus type 2 in obese (HCC) - Plan: Comprehensive metabolic panel, Hemoglobin A1c, Microalbumin / creatinine urine ratio, Ambulatory referral to Ophthalmology  Essential hypertension - Plan: chlorthalidone (HYGROTON) 25 MG tablet  Need for pneumococcal vaccination - Plan: Pneumococcal polysaccharide vaccine 23-valent greater than or equal to 2yo subcutaneous/IM  Status: new for DM, uncontrolled HTN  Orders as above. Check sugars 2-4 times per week (he did buy a glucometer from [employer]) as he is not on insulin. Stepwise approach to increase Metformin discussed and given in writing. A1c today. Will likely need adjunctive  therapy- GLP1 should he have coverage, sulfonylurea if he has no coverage. Angioedema with ACEi, will hold off on ARB for now. Chlorthalidone to start, Norvasc if not controlled for some renal protection. Discussed getting a home BP monitor and checking at home. I will see him again in 2 weeks.  The patient voiced understanding and agreement to the plan.  Jilda Roche [place], DO 06/14/16  12:05 PM

## 2016-06-14 NOTE — Telephone Encounter (Signed)
OK w me.  

## 2016-06-14 NOTE — Telephone Encounter (Signed)
Fine with me

## 2016-06-14 NOTE — Progress Notes (Signed)
Pre visit review using our clinic review tool, if applicable. No additional management support is needed unless otherwise documented below in the visit note. 

## 2016-06-14 NOTE — Telephone Encounter (Signed)
Patient would like to transfer from Dr. Lowne to Dr. Wendling, please advise °

## 2016-06-15 NOTE — Telephone Encounter (Signed)
Patient scheduled for 06/28/16 at 10:30am with Dr. Carmelia Roller.

## 2016-06-16 ENCOUNTER — Telehealth: Payer: Self-pay | Admitting: Family Medicine

## 2016-06-16 NOTE — Telephone Encounter (Signed)
Relation to ZO:XWRU Call back number:[phone]   Reason for call:  Patient inquiring about lab results and has a question regarding blood pressure medication (patient doesn't know the name of Rx)

## 2016-06-17 NOTE — Telephone Encounter (Signed)
Spoke with pt, pt state he understand results he just wanted to know if he should start taking medication provider called in on 06/14/16. Advised pt it's very important to follow up providers instructions, if provider called in a new medication for him to take then he should start taking it. Patient had no further questions or concerns at this time and discuss lab results with provider at next ov. LB

## 2016-06-20 LAB — HM DIABETES EYE EXAM

## 2016-06-22 ENCOUNTER — Inpatient Hospital Stay (HOSPITAL_COMMUNITY)
Admission: EM | Admit: 2016-06-22 | Discharge: 2016-06-27 | DRG: 638 | Disposition: A | Discharge status: Home / Self Care | Payer: Self-pay | Attending: Internal Medicine | Admitting: Internal Medicine

## 2016-06-22 ENCOUNTER — Emergency Department (HOSPITAL_COMMUNITY): Payer: Self-pay

## 2016-06-22 ENCOUNTER — Encounter (HOSPITAL_COMMUNITY): Payer: Self-pay

## 2016-06-22 DIAGNOSIS — Z6841 Body Mass Index (BMI) 40.0 and over, adult: Secondary | ICD-10-CM

## 2016-06-22 DIAGNOSIS — F909 Attention-deficit hyperactivity disorder, unspecified type: Secondary | ICD-10-CM | POA: Diagnosis present

## 2016-06-22 DIAGNOSIS — Z833 Family history of diabetes mellitus: Secondary | ICD-10-CM

## 2016-06-22 DIAGNOSIS — E876 Hypokalemia: Secondary | ICD-10-CM | POA: Diagnosis present

## 2016-06-22 DIAGNOSIS — L03311 Cellulitis of abdominal wall: Secondary | ICD-10-CM | POA: Diagnosis present

## 2016-06-22 DIAGNOSIS — Z79899 Other long term (current) drug therapy: Secondary | ICD-10-CM

## 2016-06-22 DIAGNOSIS — Z888 Allergy status to other drugs, medicaments and biological substances status: Secondary | ICD-10-CM

## 2016-06-22 DIAGNOSIS — I10 Essential (primary) hypertension: Secondary | ICD-10-CM | POA: Diagnosis present

## 2016-06-22 DIAGNOSIS — E871 Hypo-osmolality and hyponatremia: Secondary | ICD-10-CM | POA: Diagnosis present

## 2016-06-22 DIAGNOSIS — E1310 Other specified diabetes mellitus with ketoacidosis without coma: Secondary | ICD-10-CM

## 2016-06-22 DIAGNOSIS — N289 Disorder of kidney and ureter, unspecified: Secondary | ICD-10-CM | POA: Diagnosis present

## 2016-06-22 DIAGNOSIS — E1110 Type 2 diabetes mellitus with ketoacidosis without coma: Secondary | ICD-10-CM | POA: Diagnosis present

## 2016-06-22 DIAGNOSIS — Z794 Long term (current) use of insulin: Secondary | ICD-10-CM

## 2016-06-22 DIAGNOSIS — L03316 Cellulitis of umbilicus: Secondary | ICD-10-CM | POA: Diagnosis present

## 2016-06-22 DIAGNOSIS — Z8249 Family history of ischemic heart disease and other diseases of the circulatory system: Secondary | ICD-10-CM

## 2016-06-22 DIAGNOSIS — J45909 Unspecified asthma, uncomplicated: Secondary | ICD-10-CM | POA: Diagnosis present

## 2016-06-22 DIAGNOSIS — F319 Bipolar disorder, unspecified: Secondary | ICD-10-CM | POA: Diagnosis present

## 2016-06-22 DIAGNOSIS — E1010 Type 1 diabetes mellitus with ketoacidosis without coma: Principal | ICD-10-CM | POA: Diagnosis present

## 2016-06-22 LAB — COMPREHENSIVE METABOLIC PANEL
ALBUMIN: 4.5 g/dL (ref 3.5–5.0)
ALK PHOS: 97 U/L (ref 38–126)
ALT: 34 U/L (ref 17–63)
ANION GAP: 26 — AB (ref 5–15)
AST: 57 U/L — ABNORMAL HIGH (ref 15–41)
BILIRUBIN TOTAL: 3.2 mg/dL — AB (ref 0.3–1.2)
BUN: 11 mg/dL (ref 6–20)
CALCIUM: 9.7 mg/dL (ref 8.9–10.3)
CO2: 8 mmol/L — ABNORMAL LOW (ref 22–32)
Chloride: 91 mmol/L — ABNORMAL LOW (ref 101–111)
Creatinine, Ser: 1.55 mg/dL — ABNORMAL HIGH (ref 0.61–1.24)
GFR calc non Af Amer: >60 mL/min (ref >60)
GLUCOSE: 478 mg/dL — AB (ref 65–99)
POTASSIUM: 4 mmol/L (ref 3.5–5.1)
Sodium: 125 mmol/L — ABNORMAL LOW (ref 135–145)
TOTAL PROTEIN: 8.3 g/dL — AB (ref 6.5–8.1)

## 2016-06-22 LAB — I-STAT VENOUS BLOOD GAS, ED
Acid-base deficit: 17 mmol/L — ABNORMAL HIGH (ref 0.0–2.0)
Bicarbonate: 8.8 mmol/L — ABNORMAL LOW (ref 20.0–28.0)
O2 SAT: 59 %
PCO2 VEN: 21.5 mmHg — AB (ref 44.0–60.0)
TCO2: 9 mmol/L (ref 0–100)
pH, Ven: 7.22 — ABNORMAL LOW (ref 7.250–7.430)
pO2, Ven: 36 mmHg (ref 32.0–45.0)

## 2016-06-22 LAB — CBC
HEMATOCRIT: 47.7 % (ref 39.0–52.0)
HEMOGLOBIN: 17.1 g/dL — AB (ref 13.0–17.0)
MCH: 29.2 pg (ref 26.0–34.0)
MCHC: 35.8 g/dL (ref 30.0–36.0)
MCV: 81.4 fL (ref 78.0–100.0)
Platelets: 342 10*3/uL (ref 150–400)
RBC: 5.86 MIL/uL — AB (ref 4.22–5.81)
RDW: 15 % (ref 11.5–15.5)
WBC: 25 10*3/uL — ABNORMAL HIGH (ref 4.0–10.5)

## 2016-06-22 LAB — LIPASE, BLOOD: Lipase: 110 U/L — ABNORMAL HIGH (ref 11–51)

## 2016-06-22 LAB — I-STAT CG4 LACTIC ACID, ED: LACTIC ACID, VENOUS: 2 mmol/L — AB (ref 0.5–1.9)

## 2016-06-22 LAB — CBG MONITORING, ED: GLUCOSE-CAPILLARY: 406 mg/dL — AB (ref 65–99)

## 2016-06-22 MED ORDER — PIPERACILLIN-TAZOBACTAM 3.375 G IVPB 30 MIN
3.3750 g | Freq: Once | INTRAVENOUS | Status: AC
  Administered 2016-06-22: 3.375 g via INTRAVENOUS
  Filled 2016-06-22: qty 50

## 2016-06-22 MED ORDER — SODIUM CHLORIDE 0.9 % IV BOLUS (SEPSIS): 2000.0000 mL | Freq: Once | INTRAVENOUS | Status: DC

## 2016-06-22 MED ORDER — SODIUM CHLORIDE 0.9 % IV SOLN
INTRAVENOUS | Status: DC
  Administered 2016-06-22: 4.2 [IU]/h via INTRAVENOUS
  Administered 2016-06-23: 10.5 [IU]/h via INTRAVENOUS
  Administered 2016-06-24: 7.5 [IU]/h via INTRAVENOUS
  Administered 2016-06-24: 5 [IU]/h via INTRAVENOUS
  Filled 2016-06-22 (×2): qty 2.5

## 2016-06-22 MED ORDER — ONDANSETRON 4 MG PO TBDP
ORAL_TABLET | ORAL | Status: AC
  Filled 2016-06-22: qty 1

## 2016-06-22 MED ORDER — DEXTROSE-NACL 5-0.45 % IV SOLN: INTRAVENOUS | Status: DC

## 2016-06-22 MED ORDER — SODIUM CHLORIDE 0.9 % IV BOLUS (SEPSIS)
1000.0000 mL | Freq: Once | INTRAVENOUS | Status: AC
  Administered 2016-06-23: 1000 mL via INTRAVENOUS

## 2016-06-22 MED ORDER — IOPAMIDOL (ISOVUE-300) INJECTION 61%
100.0000 mL | Freq: Once | INTRAVENOUS | Status: AC | PRN
  Administered 2016-06-22: 100 mL via INTRAVENOUS

## 2016-06-22 MED ORDER — SODIUM CHLORIDE 0.9 % IV BOLUS (SEPSIS)
1000.0000 mL | Freq: Once | INTRAVENOUS | Status: AC
  Administered 2016-06-22: 1000 mL via INTRAVENOUS

## 2016-06-22 MED ORDER — ONDANSETRON 4 MG PO TBDP
4.0000 mg | ORAL_TABLET | Freq: Once | ORAL | Status: AC | PRN
Start: 2016-06-22 — End: 2016-06-22
  Administered 2016-06-22: 4 mg via ORAL

## 2016-06-22 MED ORDER — ONDANSETRON HCL 4 MG/2ML IJ SOLN
4.0000 mg | Freq: Once | INTRAMUSCULAR | Status: AC
  Administered 2016-06-22: 4 mg via INTRAVENOUS
  Filled 2016-06-22: qty 2

## 2016-06-22 MED ORDER — SODIUM CHLORIDE 0.9 % IV BOLUS (SEPSIS)
500.0000 mL | Freq: Once | INTRAVENOUS | Status: AC
  Administered 2016-06-23: 500 mL via INTRAVENOUS

## 2016-06-22 NOTE — ED Provider Notes (Signed)
  Physical Exam  BP 135/66   Pulse (!) 105   Temp 97.6 F (36.4 C) (Oral)   Resp (!) 29   Ht 6' (1.829 m)   Wt (!) 370 lb (167.8 kg)   SpO2 100%   BMI 50.18 kg/m   Physical Exam  ED Course  Procedures  MDM   Assuming care of patient from Dr. Silverio Lay   Patient in the ED for fevers, and perineal infection. Workup thus far shows elevated blood sugar, lipase, elevated AG and elevated WC.  Concerning findings are the labs and exam showing infection in the perineal region. Important pending results are CT abdomen and pelvis to assess the infction  According to Dr. Silverio Lay, plan is to admit patient after CT scan. Pt has already been given antibiotics. If there is deep infection on CT, surgery will be consulted.   Patient had no complains, no concerns from the nursing side. Will continue to monitor.        Derwood Kaplan, MD 06/22/16 [phone]

## 2016-06-22 NOTE — ED Notes (Signed)
Vomiting x 1 week and has not been able to keep anything down.  Fever of 102.  SOB.  Generalized weakness and dizziness x 1 week.  Hx of Type 1 DM.  Fruity breath noted.

## 2016-06-22 NOTE — ED Notes (Signed)
Patient aware that urine specimen needed.  States that he urinated just prior to coming back to room.

## 2016-06-22 NOTE — ED Notes (Signed)
Code Sepsis activated @ 2057  (RN aware)

## 2016-06-22 NOTE — ED Provider Notes (Addendum)
[facility]-EMERGENCY DEPT Provider Note   CSN: [id number] Arrival date & time: 06/22/16  1752     History   Chief Complaint Chief Complaint  Patient presents with  . Shortness of Breath  . Abdominal Pain    HPI Luis Boyer is a 26 y.o. male history of bipolar, depression, ADHD, recent diagnosis diabetes here presenting with abdominal pain, vomiting, fevers. Patient was seen in the ED about a week ago and was diagnosed with new onset type 2 diabetes. Patient also was Diagnosed with balanitis and was prescribed hydrocortisone cream. Patient states that he has persistent fevers for the last week. Also has persistent abdominal pain and vomiting and can't keep anything down. Patient also noticed some purulent discharge from his umbilicus. Patient denies any purulent discharge from his penis or any redness of his penis. Patient has been taking metformin as prescribed and not currently on insulin.   The history is provided by the patient.    Past Medical History:  Diagnosis Date  . ADD (attention deficit disorder)   . Asthma   . Bipolar 1 disorder (HCC)   . Depression   . Diabetes mellitus without complication (HCC)   . Hypertension   . Obesity     Patient Active Problem List   Diagnosis Date Noted  . Enlarged lymph nodes 04/19/2014  . Rectal bleed 12/20/2011  . Constipation due to slow transit 12/20/2011  . Angioedema 12/20/2011  . BIPOLAR DISORDER UNSPECIFIED 01/17/2007  . ANGER 11/29/2006  . ADD 11/29/2006  . Essential hypertension 06/08/2006  . ASTHMA 06/08/2006    History reviewed. No pertinent surgical history.     Home Medications    Prior to Admission medications   Medication Sig Start Date End Date Taking? Authorizing Provider  chlorthalidone (HYGROTON) 25 MG tablet Take 1 tablet (25 mg total) by mouth daily. 06/14/16   Sharlene Dory, DO  hydrocortisone cream 1 % Apply to affected area 2 times daily 06/13/16   Lorre Nick, MD  ibuprofen (ADVIL,MOTRIN)  200 MG tablet Take 400 mg by mouth every 6 (six) hours as needed (pain).    Historical Provider, MD  metFORMIN (GLUCOPHAGE) 500 MG tablet Take 1 tablet (500 mg total) by mouth 2 (two) times daily with a meal. 06/13/16   Doug Sou, MD    Family History Family History  Problem Relation Age of Onset  . Cervical cancer Mother   . Diabetes Father   . Stroke Father   . Hypertension Father   . Hyperlipidemia Father   . Hyperlipidemia Brother   . Hypertension Brother   . Breast cancer Paternal Aunt   . Breast cancer Paternal Grandmother   . Colon cancer Neg Hx     Social History Social History  Substance Use Topics  . Smoking status: Never Smoker  . Smokeless tobacco: Never Used  . Alcohol use No     Allergies   Celebrex [celecoxib] and Ace inhibitors   Review of Systems Review of Systems  Constitutional: Positive for fever.  Gastrointestinal: Positive for abdominal pain and vomiting.  All other systems reviewed and are negative.    Physical Exam Updated Vital Signs BP 135/66   Pulse (!) 105   Temp 97.6 F (36.4 C) (Oral)   Resp (!) 29   Ht 6' (1.829 m)   Wt (!) 370 lb (167.8 kg)   SpO2 100%   BMI 50.18 kg/m   Physical Exam  Constitutional:  Ill appearing, dehydrated   HENT:  Head: Normocephalic.  MM dry   Eyes: Conjunctivae and EOM are normal. Pupils are equal, round, and reactive to light.  Neck: Normal range of motion. Neck supple.  Cardiovascular: Regular rhythm and normal heart sounds.   Tachycardic   Pulmonary/Chest: Breath sounds normal. No respiratory distress.  tachypneic   Abdominal:  + purulent discharge from umbilicus. + mild tenderness around it. ? Small area of cellulitis. Some redness in the groin area but no obvious subcutaneous emphysema.   Genitourinary:  Genitourinary Comments: Testicles nontender. No obvious scrotal abscess or cellulitis. There is diffuse redness around the groin area consistent with yeast vs inflammation but no  obvious cellulitis or abscess   Musculoskeletal: Normal range of motion.  Neurological: He is alert.  Skin: Skin is warm.  Psychiatric: He has a normal mood and affect.  Nursing note and vitals reviewed.    ED Treatments / Results  Labs (all labs ordered are listed, but only abnormal results are displayed) Labs Reviewed  LIPASE, BLOOD - Abnormal; Notable for the following:       Result Value   Lipase 110 (*)    All other components within normal limits  COMPREHENSIVE METABOLIC PANEL - Abnormal; Notable for the following:    Sodium 125 (*)    Chloride 91 (*)    CO2 8 (*)    Glucose, Bld 478 (*)    Creatinine, Ser 1.55 (*)    Total Protein 8.3 (*)    AST 57 (*)    Total Bilirubin 3.2 (*)    Anion gap 26 (*)    All other components within normal limits  CBC - Abnormal; Notable for the following:    WBC 25.0 (*)    RBC 5.86 (*)    Hemoglobin 17.1 (*)    All other components within normal limits  I-STAT CG4 LACTIC ACID, ED - Abnormal; Notable for the following:    Lactic Acid, Venous 2.00 (*)    All other components within normal limits  I-STAT VENOUS BLOOD GAS, ED - Abnormal; Notable for the following:    pH, Ven 7.220 (*)    pCO2, Ven 21.5 (*)    Bicarbonate 8.8 (*)    Acid-base deficit 17.0 (*)    All other components within normal limits  CULTURE, BLOOD (ROUTINE X 2)  CULTURE, BLOOD (ROUTINE X 2)  URINALYSIS, ROUTINE W REFLEX MICROSCOPIC    EKG  EKG Interpretation  Date/Time:  Tuesday June 22 2016 18:11:41 EDT Ventricular Rate:  112 PR Interval:  134 QRS Duration: 94 QT Interval:  360 QTC Calculation: 491 R Axis:   71 Text Interpretation:  Sinus tachycardia Otherwise normal ECG No significant change since last tracing Confirmed by Hurman Ketelsen  MD, Sully Dyment ([id number]) on 06/22/2016 9:51:29 PM       Radiology Dg Chest 2 View  Result Date: 06/22/2016 CLINICAL DATA:  Acute onset of shortness of breath. Initial encounter. EXAM: CHEST  2 VIEW COMPARISON:  Chest radiograph  from 07/12/2013 FINDINGS: The lungs are well-aerated and clear. There is no evidence of focal opacification, pleural effusion or pneumothorax. The heart is normal in size; the mediastinal contour is within normal limits. No acute osseous abnormalities are seen. IMPRESSION: No acute cardiopulmonary process seen. Electronically Signed   By: Roanna Raider M.D.   On: 06/22/2016 19:02    Procedures Procedures (including critical care time)  Angiocath insertion Performed by: Richardean Canal  Consent: Verbal consent obtained. Risks and benefits: risks, benefits and alternatives were discussed Time out: Immediately prior to  procedure a "time out" was called to verify the correct patient, procedure, equipment, support staff and site/side marked as required.  Preparation: Patient was prepped and draped in the usual sterile fashion.  Vein Location: R antecube  Ultrasound Guided  Gauge: 20 long   Normal blood return and flush without difficulty Patient tolerance: Patient tolerated the procedure well with no immediate complications.  Angiocath insertion Performed by: Richardean Canal  Consent: Verbal consent obtained. Risks and benefits: risks, benefits and alternatives were discussed Time out: Immediately prior to procedure a "time out" was called to verify the correct patient, procedure, equipment, support staff and site/side marked as required.  Preparation: Patient was prepped and draped in the usual sterile fashion.  Vein Location: L antecube  Ultrasound Guided  Gauge: 20 long   Normal blood return and flush without difficulty Patient tolerance: Patient tolerated the procedure well with no immediate complications.  CRITICAL CARE Performed by: Richardean Canal   Total critical care time: 30 minutes  Critical care time was exclusive of separately billable procedures and treating other patients.  Critical care was necessary to treat or prevent imminent or life-threatening  deterioration.  Critical care was time spent personally by me on the following activities: development of treatment plan with patient and/or surrogate as well as nursing, discussions with consultants, evaluation of patient's response to treatment, examination of patient, obtaining history from patient or surrogate, ordering and performing treatments and interventions, ordering and review of laboratory studies, ordering and review of radiographic studies, pulse oximetry and re-evaluation of patient's condition.      Medications Ordered in ED Medications  ondansetron (ZOFRAN-ODT) 4 MG disintegrating tablet (not administered)  dextrose 5 %-0.45 % sodium chloride infusion (not administered)  insulin regular (NOVOLIN R,HUMULIN R) 250 Units in sodium chloride 0.9 % 250 mL (1 Units/mL) infusion (4.2 Units/hr Intravenous New Bag/Given 06/22/16 2214)  sodium chloride 0.9 % bolus 1,000 mL (not administered)    And  sodium chloride 0.9 % bolus 1,000 mL (1,000 mLs Intravenous New Bag/Given 06/22/16 2204)    And  sodium chloride 0.9 % bolus 1,000 mL (1,000 mLs Intravenous New Bag/Given 06/22/16 2204)    And  sodium chloride 0.9 % bolus 1,000 mL (not administered)    And  sodium chloride 0.9 % bolus 1,000 mL (not administered)    And  sodium chloride 0.9 % bolus 500 mL (not administered)  ondansetron (ZOFRAN-ODT) disintegrating tablet 4 mg (4 mg Oral Given 06/22/16 1815)  ondansetron (ZOFRAN) injection 4 mg (4 mg Intravenous Given 06/22/16 2220)  piperacillin-tazobactam (ZOSYN) IVPB 3.375 g (0 g Intravenous Stopped 06/22/16 2300)     Initial Impression / Assessment and Plan / ED Course  I have reviewed the triage vital signs and the nursing notes.  Pertinent labs & imaging results that were available during my care of the patient were reviewed by me and considered in my medical decision making (see chart for details).     CLARANCE BOLLARD is a 27 y.o. male here with hyperglycemia, fever, abdominal  pain, discharge from umbilicus. He was recently diagnosed with balanitis. Consider DKA vs pneumonia vs UTI vs local cellulitis from umbilicus vs necrotyzing fasciitis. Will activate code sepsis and will give zosyn. Will get CT ab/pel.   11:10 PM He is a difficult IV stick. I placed bilateral IVs. Ordered zosyn. WBC 25. Bicarb 8, Na 125, AG 26. Also has renal insufficiency. Ordered 30 cc/kg bolus as well. CXR unremarkable.    11:48 PM Sepsis -  Repeat Assessment  Performed at:    11:30 pm  Vitals     Blood pressure 135/66, pulse (!) 105, temperature 97.6 F (36.4 C), temperature source Oral, resp. rate (!) 29, height 6' (1.829 m), weight (!) 370 lb (167.8 kg), SpO2 100 %.  Heart:     Tachycardic  Lungs:    CTA  Capillary Refill:   <2 sec  Peripheral Pulse:   Radial pulse palpable  Skin:     Normal Color  Patient on glucostabilizer. CT ab/pel pending. If it doesn't show significant intra abdominal process, anticipate admission to step down. Otherwise, will need surgical evaluation. Signed out to Dr. Shyrl Numbers to follow up CT and then admit patient for Diabetic ketoacidosis without coma.    Final Clinical Impressions(s) / ED Diagnoses   Final diagnoses:  None    New Prescriptions New Prescriptions   No medications on file     Charlynne Pander, MD 06/22/16 2349    Charlynne Pander, MD 06/22/16 2349    Charlynne Pander, MD 07/02/16 2051

## 2016-06-22 NOTE — ED Notes (Signed)
Three unsuccessful IV attempts. IV consult ordered.

## 2016-06-22 NOTE — ED Triage Notes (Signed)
Per Pt, Pt is coming from home with complaints of SOB, N/V/D x 1 week. Pt reports epigastric abdominal pain.

## 2016-06-23 ENCOUNTER — Ambulatory Visit: Payer: Self-pay | Admitting: Family Medicine

## 2016-06-23 DIAGNOSIS — L03311 Cellulitis of abdominal wall: Secondary | ICD-10-CM | POA: Diagnosis present

## 2016-06-23 DIAGNOSIS — E1110 Type 2 diabetes mellitus with ketoacidosis without coma: Secondary | ICD-10-CM | POA: Diagnosis present

## 2016-06-23 DIAGNOSIS — E1310 Other specified diabetes mellitus with ketoacidosis without coma: Secondary | ICD-10-CM

## 2016-06-23 LAB — GLUCOSE, CAPILLARY
GLUCOSE-CAPILLARY: 288 mg/dL — AB (ref 65–99)
GLUCOSE-CAPILLARY: 303 mg/dL — AB (ref 65–99)
GLUCOSE-CAPILLARY: 246 mg/dL — AB (ref 65–99)
GLUCOSE-CAPILLARY: 242 mg/dL — AB (ref 65–99)
GLUCOSE-CAPILLARY: 203 mg/dL — AB (ref 65–99)
GLUCOSE-CAPILLARY: 179 mg/dL — AB (ref 65–99)
GLUCOSE-CAPILLARY: 183 mg/dL — AB (ref 65–99)
GLUCOSE-CAPILLARY: 170 mg/dL — AB (ref 65–99)
GLUCOSE-CAPILLARY: 183 mg/dL — AB (ref 65–99)
GLUCOSE-CAPILLARY: 165 mg/dL — AB (ref 65–99)
Glucose-Capillary: 156 mg/dL — ABNORMAL HIGH (ref 65–99)
Glucose-Capillary: 167 mg/dL — ABNORMAL HIGH (ref 65–99)
Glucose-Capillary: 174 mg/dL — ABNORMAL HIGH (ref 65–99)
Glucose-Capillary: 160 mg/dL — ABNORMAL HIGH (ref 65–99)
Glucose-Capillary: 178 mg/dL — ABNORMAL HIGH (ref 65–99)
Glucose-Capillary: 164 mg/dL — ABNORMAL HIGH (ref 65–99)

## 2016-06-23 LAB — I-STAT CG4 LACTIC ACID, ED: Lactic Acid, Venous: 1.28 mmol/L (ref 0.5–1.9)

## 2016-06-23 LAB — BASIC METABOLIC PANEL
ANION GAP: 23 — AB (ref 5–15)
Anion gap: 16 — ABNORMAL HIGH (ref 5–15)
Anion gap: 12 (ref 5–15)
Anion gap: 14 (ref 5–15)
BUN: 11 mg/dL (ref 6–20)
BUN: 7 mg/dL (ref 6–20)
BUN: 6 mg/dL (ref 6–20)
BUN: 5 mg/dL — AB (ref 6–20)
CHLORIDE: 96 mmol/L — AB (ref 101–111)
CHLORIDE: 101 mmol/L (ref 101–111)
CHLORIDE: 103 mmol/L (ref 101–111)
CO2: 11 mmol/L — ABNORMAL LOW (ref 22–32)
CO2: 15 mmol/L — ABNORMAL LOW (ref 22–32)
CO2: 15 mmol/L — ABNORMAL LOW (ref 22–32)
CO2: 17 mmol/L — ABNORMAL LOW (ref 22–32)
CREATININE: 1.4 mg/dL — AB (ref 0.61–1.24)
CREATININE: 1.06 mg/dL (ref 0.61–1.24)
CREATININE: 1.07 mg/dL (ref 0.61–1.24)
Calcium: 9 mg/dL (ref 8.9–10.3)
Calcium: 8.7 mg/dL — ABNORMAL LOW (ref 8.9–10.3)
Calcium: 8.9 mg/dL (ref 8.9–10.3)
Calcium: 9.5 mg/dL (ref 8.9–10.3)
Chloride: 103 mmol/L (ref 101–111)
Creatinine, Ser: 1.17 mg/dL (ref 0.61–1.24)
GFR calc Af Amer: >60 mL/min (ref >60)
GFR calc non Af Amer: >60 mL/min (ref >60)
GLUCOSE: 317 mg/dL — AB (ref 65–99)
GLUCOSE: 215 mg/dL — AB (ref 65–99)
Glucose, Bld: 170 mg/dL — ABNORMAL HIGH (ref 65–99)
Glucose, Bld: 156 mg/dL — ABNORMAL HIGH (ref 65–99)
POTASSIUM: 2.6 mmol/L — AB (ref 3.5–5.1)
POTASSIUM: 3.9 mmol/L (ref 3.5–5.1)
POTASSIUM: 3.6 mmol/L (ref 3.5–5.1)
Potassium: 2.4 mmol/L — CL (ref 3.5–5.1)
SODIUM: 130 mmol/L — AB (ref 135–145)
SODIUM: 130 mmol/L — AB (ref 135–145)
SODIUM: 134 mmol/L — AB (ref 135–145)
Sodium: 132 mmol/L — ABNORMAL LOW (ref 135–145)

## 2016-06-23 LAB — CBG MONITORING, ED
GLUCOSE-CAPILLARY: 335 mg/dL — AB (ref 65–99)
GLUCOSE-CAPILLARY: 279 mg/dL — AB (ref 65–99)
Glucose-Capillary: 232 mg/dL — ABNORMAL HIGH (ref 65–99)
Glucose-Capillary: 243 mg/dL — ABNORMAL HIGH (ref 65–99)
Glucose-Capillary: 381 mg/dL — ABNORMAL HIGH (ref 65–99)

## 2016-06-23 LAB — URINALYSIS, ROUTINE W REFLEX MICROSCOPIC
BACTERIA UA: NONE SEEN
Bilirubin Urine: NEGATIVE
KETONES UR: 80 mg/dL — AB
Leukocytes, UA: NEGATIVE
NITRITE: NEGATIVE
PROTEIN: 100 mg/dL — AB
Specific Gravity, Urine: 1.038 — ABNORMAL HIGH (ref 1.005–1.030)
pH: 5 (ref 5.0–8.0)

## 2016-06-23 LAB — CBC
HCT: 40.7 % (ref 39.0–52.0)
HEMOGLOBIN: 14.5 g/dL (ref 13.0–17.0)
MCH: 28 pg (ref 26.0–34.0)
MCHC: 35.6 g/dL (ref 30.0–36.0)
MCV: 78.7 fL (ref 78.0–100.0)
PLATELETS: 206 10*3/uL (ref 150–400)
RBC: 5.17 MIL/uL (ref 4.22–5.81)
RDW: 14.4 % (ref 11.5–15.5)
WBC: 12.1 10*3/uL — ABNORMAL HIGH (ref 4.0–10.5)

## 2016-06-23 LAB — BASIC METABOLIC PANEL WITH GFR
Anion gap: 18 — ABNORMAL HIGH (ref 5–15)
BUN: 9 mg/dL (ref 6–20)
CO2: 13 mmol/L — ABNORMAL LOW (ref 22–32)
Calcium: 8.4 mg/dL — ABNORMAL LOW (ref 8.9–10.3)
Chloride: 99 mmol/L — ABNORMAL LOW (ref 101–111)
Creatinine, Ser: 1.39 mg/dL — ABNORMAL HIGH (ref 0.61–1.24)
GFR calc Af Amer: >60 mL/min
GFR calc non Af Amer: >60 mL/min
Glucose, Bld: 294 mg/dL — ABNORMAL HIGH (ref 65–99)
Potassium: 2.4 mmol/L — CL (ref 3.5–5.1)
Sodium: 130 mmol/L — ABNORMAL LOW (ref 135–145)

## 2016-06-23 LAB — MRSA PCR SCREENING: MRSA by PCR: NEGATIVE

## 2016-06-23 LAB — HIV ANTIBODY (ROUTINE TESTING W REFLEX): HIV Screen 4th Generation wRfx: NONREACTIVE

## 2016-06-23 MED ORDER — POTASSIUM CHLORIDE CRYS ER 20 MEQ PO TBCR
40.0000 meq | EXTENDED_RELEASE_TABLET | Freq: Once | ORAL | Status: AC
  Administered 2016-06-23: 40 meq via ORAL
  Filled 2016-06-23: qty 2

## 2016-06-23 MED ORDER — SODIUM CHLORIDE 0.9 % IV SOLN: INTRAVENOUS | Status: AC

## 2016-06-23 MED ORDER — INSULIN STARTER KIT- SYRINGES (ENGLISH)
1.0000 | Freq: Once | Status: AC
Start: 2016-06-23 — End: 2016-06-23
  Administered 2016-06-23: 1
  Filled 2016-06-23 (×2): qty 1

## 2016-06-23 MED ORDER — SODIUM CHLORIDE 0.9 % IV SOLN
30.0000 meq | Freq: Once | INTRAVENOUS | Status: AC
  Administered 2016-06-23: 30 meq via INTRAVENOUS
  Filled 2016-06-23: qty 15

## 2016-06-23 MED ORDER — DEXTROSE-NACL 5-0.45 % IV SOLN
INTRAVENOUS | Status: DC
  Administered 2016-06-23: 05:00:00 via INTRAVENOUS

## 2016-06-23 MED ORDER — KCL IN DEXTROSE-NACL 40-5-0.45 MEQ/L-%-% IV SOLN
INTRAVENOUS | Status: DC
  Administered 2016-06-23: 1000 mL via INTRAVENOUS
  Administered 2016-06-23 – 2016-06-24 (×3): via INTRAVENOUS
  Filled 2016-06-23 (×6): qty 1000

## 2016-06-23 MED ORDER — SODIUM CHLORIDE 0.9 % IV SOLN
INTRAVENOUS | Status: DC
Start: 2016-06-23 — End: 2016-06-23

## 2016-06-23 MED ORDER — ORAL CARE MOUTH RINSE
15.0000 mL | Freq: Two times a day (BID) | OROMUCOSAL | Status: DC
  Administered 2016-06-23 – 2016-06-25 (×5): 15 mL via OROMUCOSAL

## 2016-06-23 MED ORDER — ENOXAPARIN SODIUM 40 MG/0.4ML ~~LOC~~ SOLN
40.0000 mg | Freq: Every day | SUBCUTANEOUS | Status: DC
  Filled 2016-06-23: qty 0.4

## 2016-06-23 MED ORDER — POTASSIUM CHLORIDE 2 MEQ/ML IV SOLN
30.0000 meq | INTRAVENOUS | Status: AC
  Administered 2016-06-23 (×3): 30 meq via INTRAVENOUS
  Filled 2016-06-23 (×5): qty 15

## 2016-06-23 MED ORDER — LIVING WELL WITH DIABETES BOOK
Freq: Once | Status: AC
  Administered 2016-06-23: 20:00:00
  Filled 2016-06-23 (×2): qty 1

## 2016-06-23 MED ORDER — ENOXAPARIN SODIUM 100 MG/ML ~~LOC~~ SOLN
0.5000 mg/kg | Freq: Every day | SUBCUTANEOUS | Status: DC
  Administered 2016-06-23 – 2016-06-25 (×3): 85 mg via SUBCUTANEOUS
  Filled 2016-06-23: qty 0.85
  Filled 2016-06-23 (×2): qty 1
  Filled 2016-06-23: qty 0.85

## 2016-06-23 MED ORDER — NYSTATIN 100000 UNIT/GM EX POWD
Freq: Three times a day (TID) | CUTANEOUS | Status: DC
  Administered 2016-06-23 – 2016-06-26 (×8): via TOPICAL
  Administered 2016-06-27: 1 g via TOPICAL
  Filled 2016-06-23 (×2): qty 15

## 2016-06-23 MED ORDER — PIPERACILLIN-TAZOBACTAM 3.375 G IVPB
3.3750 g | Freq: Three times a day (TID) | INTRAVENOUS | Status: DC
  Administered 2016-06-23 – 2016-06-24 (×5): 3.375 g via INTRAVENOUS
  Filled 2016-06-23 (×5): qty 50

## 2016-06-23 NOTE — Progress Notes (Signed)
Potassium 2.6 was paged to DR. Jomarie Longs. See new orders

## 2016-06-23 NOTE — ED Notes (Signed)
Date and time results received: 06/23/16 0248 (use smartphrase ".now" to insert current time)  Test: potassium Critical Value: 2.4  Name of Provider Notified: gardener  Orders Received? Or Actions Taken?: PO and IV potassium

## 2016-06-23 NOTE — H&P (Signed)
History and Physical    Luis Boyer Prentiss WJX:[id number] DOB: 1990/12/12 DOA: 06/22/2016  PCP: Donato Schultz, DO  Patient coming from: Home  I have personally briefly reviewed patient's old medical records in [facility]  Chief Complaint: SOB, Abd pain  HPI: Luis Boyer is a 26 y.o. male with medical history significant of "DM2" a week ago (which in hind sight actually looks like it might be MODY), balanitis 10 days ago.  Patient presents to the ED with c/o SOB and abd pain.  Patient states that he has persistent fevers for the last week. Also has persistent abdominal pain and vomiting and can't keep anything down. Patient also noticed some purulent discharge from his umbilicus. Patient denies any purulent discharge from his penis or any redness of his penis. Patient has been taking metformin as prescribed and not currently on insulin.   ED Course: Patient in DKA, CT abd/pelvis is negative other than a small fat containing umbilical hernia (no evidence of nec-fash).  Patient started on zosyn empirically, sepsis fluids and insulin gtt.   Review of Systems: As per HPI otherwise 10 point review of systems negative.   Past Medical History:  Diagnosis Date  . ADD (attention deficit disorder)   . Asthma   . Bipolar 1 disorder (HCC)   . Depression   . Diabetes mellitus without complication (HCC)   . Hypertension   . Obesity     History reviewed. No pertinent surgical history.   reports that he has never smoked. He has never used smokeless tobacco. He reports that he does not drink alcohol or use drugs.  Allergies  Allergen Reactions  . Celebrex [Celecoxib] Shortness Of Breath  . Ace Inhibitors Other (See Comments)    angioedema    Family History  Problem Relation Age of Onset  . Cervical cancer Mother   . Diabetes Father   . Stroke Father   . Hypertension Father   . Hyperlipidemia Father   . Hyperlipidemia Brother   . Hypertension Brother   . Breast cancer  Paternal Aunt   . Breast cancer Paternal Grandmother   . Colon cancer Neg Hx      Prior to Admission medications   Medication Sig Start Date End Date Taking? Authorizing Provider  ibuprofen (ADVIL,MOTRIN) 200 MG tablet Take 400 mg by mouth every 6 (six) hours as needed (pain).   Yes Historical Provider, MD  metFORMIN (GLUCOPHAGE) 500 MG tablet Take 1 tablet (500 mg total) by mouth 2 (two) times daily with a meal. 06/13/16  Yes Doug Sou, MD  Multiple Vitamin (MULTIVITAMIN WITH MINERALS) TABS tablet Take 1 tablet by mouth daily.   Yes Historical Provider, MD    Physical Exam: Vitals:   06/22/16 2100 06/22/16 2115 06/22/16 2300 06/22/16 2315  BP: (!) 142/68 135/66 126/66 133/85  Pulse: (!) 101 (!) 105 100 99  Resp: 18 (!) 29 17 (!) 22  Temp:      TempSrc:      SpO2: 100% 100% 100% 100%  Weight:      Height:        Constitutional: NAD, calm Eyes: PERRL, lids and conjunctivae normal ENMT: Mucous membranes are Dry. Posterior pharynx clear of any exudate or lesions.Normal dentition.  Neck: normal, supple, no masses, no thyromegaly Respiratory: clear to auscultation bilaterally, no wheezing, no crackles. Normal respiratory effort. No accessory muscle use.  Tachypnea Cardiovascular: Regular rate and rhythm, no murmurs / rubs / gallops. No extremity edema. 2+ pedal pulses.  No carotid bruits.  Abdomen: + purulent discharge from umbilicus. + mild tenderness around it. ? Small area of cellulitis. Some redness in the groin area but no obvious subcutaneous emphysema.   Musculoskeletal: no clubbing / cyanosis. No joint deformity upper and lower extremities. Good ROM, no contractures. Normal muscle tone.  Skin: Testicles nontender. No obvious scrotal abscess or cellulitis. There is diffuse redness around the groin area consistent with yeast vs inflammation but no obvious cellulitis or abscess   Neurologic: CN 2-12 grossly intact. Sensation intact, DTR normal. Strength 5/5 in all 4.    Psychiatric: Normal judgment and insight. Alert and oriented x 3. Normal mood.    Labs on Admission: I have personally reviewed following labs and imaging studies  CBC:  Recent Labs Lab 06/22/16 1822  WBC 25.0*  HGB 17.1*  HCT 47.7  MCV 81.4  PLT 342   Basic Metabolic Panel:  Recent Labs Lab 06/22/16 1822  NA 125*  K 4.0  CL 91*  CO2 8*  GLUCOSE 478*  BUN 11  CREATININE 1.55*  CALCIUM 9.7   GFR: Estimated Creatinine Clearance: 116.1 mL/min (A) (by C-G formula based on SCr of 1.55 mg/dL (H)). Liver Function Tests:  Recent Labs Lab 06/22/16 1822  AST 57*  ALT 34  ALKPHOS 97  BILITOT 3.2*  PROT 8.3*  ALBUMIN 4.5    Recent Labs Lab 06/22/16 1822  LIPASE 110*   No results for input(s): AMMONIA in the last 168 hours. Coagulation Profile: No results for input(s): INR, PROTIME in the last 168 hours. Cardiac Enzymes: No results for input(s): CKTOTAL, CKMB, CKMBINDEX, TROPONINI in the last 168 hours. BNP (last 3 results) No results for input(s): PROBNP in the last 8760 hours. HbA1C: No results for input(s): HGBA1C in the last 72 hours. CBG:  Recent Labs Lab 06/22/16 2318 06/23/16 0020 06/23/16 0127  GLUCAP 406* 381* 335*   Lipid Profile: No results for input(s): CHOL, HDL, LDLCALC, TRIG, CHOLHDL, LDLDIRECT in the last 72 hours. Thyroid Function Tests: No results for input(s): TSH, T4TOTAL, FREET4, T3FREE, THYROIDAB in the last 72 hours. Anemia Panel: No results for input(s): VITAMINB12, FOLATE, FERRITIN, TIBC, IRON, RETICCTPCT in the last 72 hours. Urine analysis:    Component Value Date/Time   COLORURINE YELLOW 06/23/2016 0023   APPEARANCEUR CLEAR 06/23/2016 0023   LABSPEC 1.038 (H) 06/23/2016 0023   PHURINE 5.0 06/23/2016 0023   GLUCOSEU >=500 (A) 06/23/2016 0023   HGBUR SMALL (A) 06/23/2016 0023   BILIRUBINUR NEGATIVE 06/23/2016 0023   BILIRUBINUR neg 04/19/2014 1714   KETONESUR 80 (A) 06/23/2016 0023   PROTEINUR 100 (A) 06/23/2016 0023    UROBILINOGEN negative 04/19/2014 1714   NITRITE NEGATIVE 06/23/2016 0023   LEUKOCYTESUR NEGATIVE 06/23/2016 0023    Radiological Exams on Admission: Dg Chest 2 View  Result Date: 06/22/2016 CLINICAL DATA:  Acute onset of shortness of breath. Initial encounter. EXAM: CHEST  2 VIEW COMPARISON:  Chest radiograph from 07/12/2013 FINDINGS: The lungs are well-aerated and clear. There is no evidence of focal opacification, pleural effusion or pneumothorax. The heart is normal in size; the mediastinal contour is within normal limits. No acute osseous abnormalities are seen. IMPRESSION: No acute cardiopulmonary process seen. Electronically Signed   By: Roanna Raider M.D.   On: 06/22/2016 19:02   Ct Abdomen Pelvis W Contrast  Result Date: 06/23/2016 CLINICAL DATA:  Abdominal pain with nausea, vomiting, and peer lung drainage from the umbilicus. EXAM: CT ABDOMEN AND PELVIS WITH CONTRAST TECHNIQUE: Multidetector CT imaging of  the abdomen and pelvis was performed using the standard protocol following bolus administration of intravenous contrast. CONTRAST:  ISOVUE-300 IOPAMIDOL (ISOVUE-300) INJECTION 61% COMPARISON:  01/17/2003 FINDINGS: Lower chest: Lung bases are clear. Hepatobiliary: Diffuse fatty infiltration of the liver. No focal lesions identified. Gallbladder and bile ducts are unremarkable. Pancreas: Unremarkable. No pancreatic ductal dilatation or surrounding inflammatory changes. Spleen: Normal in size without focal abnormality. Adrenals/Urinary Tract: Adrenal glands are unremarkable. Kidneys are normal, without renal calculi, focal lesion, or hydronephrosis. Bladder is unremarkable. Stomach/Bowel: Stomach and small bowel are decompressed. Scattered stool throughout the colon. No small or large bowel distention. No wall thickening is appreciated. Appendix is normal. Vascular/Lymphatic: No significant vascular findings are present. No enlarged abdominal or pelvic lymph nodes. Reproductive: Prostate  is unremarkable. Other: Small periumbilical hernia containing fat. No subcutaneous abscess or infiltration is identified. No abnormality is identified that would account for purulent drainage from the umbilicus. No free air or free fluid in the abdomen. Musculoskeletal: Normal alignment of the lumbar spine. No destructive bone lesions appreciated. IMPRESSION: No evidence of abscess or fluid collection in the subcutaneous fat. Small periumbilical hernia containing fat. No bowel herniation or obstruction. Prominent fatty infiltration of the liver. Electronically Signed   By: Burman Nieves M.D.   On: 06/23/2016 00:45    EKG: Independently reviewed.  Assessment/Plan Principal Problem:   DKA (diabetic ketoacidoses) (HCC) Active Problems:   Cellulitis of abdominal wall    1. DKA - suspect patient actually has MODY instead of DM2 1. DKA pathway 2. Already has had 4.5L IVF 3. BMP Q4H 4. Insulin gtt per protocol 5. Rechecking K, replace based on recheck 2. Cellulitis of umbilicus - 1. Will leave patient on zosyn started by ED for the moment 2. CT abd/pelvis negative other than a small fat containing umbilical hernia but no evidence of abscess. 3. Repeat CBC in AM to trend leukocytosis.  DVT prophylaxis: Lovenox Code Status: Full Family Communication: Family at bedside Disposition Plan: Home after admit Consults called: None Admission status: Admit to inpatient for DKA treatment   Hillary Bow DO [facility] Pager [phone]  If 7AM-7PM, please contact day team taking care of patient [email or web address] Password [email or web address]  06/23/2016, 1:47 AM

## 2016-06-23 NOTE — ED Notes (Signed)
Ran zoysn for 30 minutes instead of 4 hours notified MD and Pharmacy

## 2016-06-23 NOTE — Progress Notes (Addendum)
Inpatient Diabetes Program Recommendations  AACE/ADA: New Consensus Statement on Inpatient Glycemic Control (2015)  Target Ranges:  Prepandial:   less than 140 mg/dL      Peak postprandial:   less than 180 mg/dL (1-2 hours)      Critically ill patients:  140 - 180 mg/dL   Lab Results  Component Value Date   GLUCAP 179 (H) 06/23/2016   HGBA1C 12.2 (H) 06/14/2016    Review of Glycemic Control Results for AHAN, EISENBERGER (MRN [id number]) as of 06/23/2016 12:57  Ref. Range 06/23/2016 07:58 06/23/2016 09:11 06/23/2016 10:17 06/23/2016 11:22 06/23/2016 12:28  Glucose-Capillary Latest Ref Range: 65 - 99 mg/dL 782 (H) 956 (H) 213 (H) 203 (H) 179 (H)   Diabetes history: DM 2 Outpatient Diabetes medications: Metformin 500 mg bid Current orders for Inpatient glycemic control: IV insulin gtt  Inpatient Diabetes Program Recommendations:  Noted patient newly diagnosed DM approx 1 week ago. Will plan to speak to pt. When appropriate.  When patient ready to transition from IV insulin, may consider starting on Lantus 33 units (0.2 units/kg x 165 kg). Please give SQ insulin 2 hrs prior to gtt dc'd and cover CBG with correction when IV insulin discontinued. Noted patient has no insurance so will make transition recommendations to 70/30 insulin prior to D/C.  Thank you, Billy Fischer. Filimon Miranda, RN, MSN, CDE  Diabetes Coordinator Inpatient Glycemic Control Team Team Pager [phone] (8am-5pm) 06/23/2016 1:09 PM

## 2016-06-23 NOTE — Progress Notes (Addendum)
Pt seen and examined, admitted this am by Dr.Gardner 25/M diagnosed with DM 1 week ago, Hba1c of 12, admitted with  1. Severe DKA, N/V Improving, continue Insulin gtt, IVF, KCL, Bmet Q6 -DM coordinator consult, will need Insulin at DC-uninsured  2. severe hypokalemia -due to insulin, replace aggressively today -check Mag  3. Hyponatremia -due to above -improved  4. Mild peri umbilical cellulitis -continue Zosyn today, CT abd unremarkable -change to Keflex tomorrow  Zannie Cove, MD

## 2016-06-23 NOTE — Progress Notes (Signed)
K is 2.4, spoke to MD about holding Insulin drip. Not at this time. K will be replaced, Insulin gtt continues.  Lab called. Sample was hemolyzed. Will be redrawn.

## 2016-06-23 NOTE — Progress Notes (Signed)
CRITICAL VALUE ALERT  Critical value received:  Potassium 2.4  Date of notification: 06/23/16  Time of notification:  0812  Critical value read back: yes  Nurse who received alert: Marica Otter  MD notified (1st page):  Dr. Jomarie Longs  Time of first page: 0812  MD notified (2nd page):  Time of second page:  Responding MD:Dr. Jomarie Longs  Time MD responded:  [phone]

## 2016-06-24 DIAGNOSIS — L03311 Cellulitis of abdominal wall: Secondary | ICD-10-CM

## 2016-06-24 LAB — BASIC METABOLIC PANEL
ANION GAP: 12 (ref 5–15)
Anion gap: 16 — ABNORMAL HIGH (ref 5–15)
Anion gap: 11 (ref 5–15)
Anion gap: 13 (ref 5–15)
BUN: <5 mg/dL — ABNORMAL LOW (ref 6–20)
BUN: 6 mg/dL (ref 6–20)
BUN: 6 mg/dL (ref 6–20)
CALCIUM: 9.3 mg/dL (ref 8.9–10.3)
CHLORIDE: 103 mmol/L (ref 101–111)
CHLORIDE: 96 mmol/L — AB (ref 101–111)
CO2: 17 mmol/L — ABNORMAL LOW (ref 22–32)
CO2: 24 mmol/L (ref 22–32)
CO2: 21 mmol/L — ABNORMAL LOW (ref 22–32)
CO2: 22 mmol/L (ref 22–32)
CREATININE: 0.97 mg/dL (ref 0.61–1.24)
CREATININE: 0.94 mg/dL (ref 0.61–1.24)
Calcium: 9.5 mg/dL (ref 8.9–10.3)
Calcium: 9.7 mg/dL (ref 8.9–10.3)
Calcium: 9.5 mg/dL (ref 8.9–10.3)
Chloride: 96 mmol/L — ABNORMAL LOW (ref 101–111)
Chloride: 96 mmol/L — ABNORMAL LOW (ref 101–111)
Creatinine, Ser: 1.02 mg/dL (ref 0.61–1.24)
Creatinine, Ser: 0.95 mg/dL (ref 0.61–1.24)
GFR calc Af Amer: >60 mL/min (ref >60)
GFR calc Af Amer: >60 mL/min (ref >60)
GFR calc Af Amer: >60 mL/min (ref >60)
GLUCOSE: 380 mg/dL — AB (ref 65–99)
Glucose, Bld: 162 mg/dL — ABNORMAL HIGH (ref 65–99)
Glucose, Bld: 225 mg/dL — ABNORMAL HIGH (ref 65–99)
Glucose, Bld: 426 mg/dL — ABNORMAL HIGH (ref 65–99)
POTASSIUM: 3.3 mmol/L — AB (ref 3.5–5.1)
POTASSIUM: 3.3 mmol/L — AB (ref 3.5–5.1)
Potassium: 2.2 mmol/L — CL (ref 3.5–5.1)
Potassium: 3.4 mmol/L — ABNORMAL LOW (ref 3.5–5.1)
SODIUM: 136 mmol/L (ref 135–145)
SODIUM: 131 mmol/L — AB (ref 135–145)
Sodium: 130 mmol/L — ABNORMAL LOW (ref 135–145)
Sodium: 130 mmol/L — ABNORMAL LOW (ref 135–145)

## 2016-06-24 LAB — GLUCOSE, CAPILLARY
GLUCOSE-CAPILLARY: 181 mg/dL — AB (ref 65–99)
GLUCOSE-CAPILLARY: 161 mg/dL — AB (ref 65–99)
GLUCOSE-CAPILLARY: 170 mg/dL — AB (ref 65–99)
GLUCOSE-CAPILLARY: 160 mg/dL — AB (ref 65–99)
GLUCOSE-CAPILLARY: 221 mg/dL — AB (ref 65–99)
GLUCOSE-CAPILLARY: 415 mg/dL — AB (ref 65–99)
Glucose-Capillary: 159 mg/dL — ABNORMAL HIGH (ref 65–99)
Glucose-Capillary: 159 mg/dL — ABNORMAL HIGH (ref 65–99)
Glucose-Capillary: 160 mg/dL — ABNORMAL HIGH (ref 65–99)
Glucose-Capillary: 161 mg/dL — ABNORMAL HIGH (ref 65–99)
Glucose-Capillary: 167 mg/dL — ABNORMAL HIGH (ref 65–99)
Glucose-Capillary: 167 mg/dL — ABNORMAL HIGH (ref 65–99)
Glucose-Capillary: 173 mg/dL — ABNORMAL HIGH (ref 65–99)
Glucose-Capillary: 176 mg/dL — ABNORMAL HIGH (ref 65–99)
Glucose-Capillary: 197 mg/dL — ABNORMAL HIGH (ref 65–99)
Glucose-Capillary: 200 mg/dL — ABNORMAL HIGH (ref 65–99)
Glucose-Capillary: 210 mg/dL — ABNORMAL HIGH (ref 65–99)
Glucose-Capillary: 252 mg/dL — ABNORMAL HIGH (ref 65–99)
Glucose-Capillary: 276 mg/dL — ABNORMAL HIGH (ref 65–99)

## 2016-06-24 MED ORDER — CEPHALEXIN 500 MG PO CAPS
500.0000 mg | ORAL_CAPSULE | Freq: Three times a day (TID) | ORAL | Status: AC
  Administered 2016-06-24 (×2): 500 mg via ORAL
  Filled 2016-06-24 (×3): qty 1

## 2016-06-24 MED ORDER — POTASSIUM CHLORIDE CRYS ER 20 MEQ PO TBCR
40.0000 meq | EXTENDED_RELEASE_TABLET | Freq: Three times a day (TID) | ORAL | Status: DC
  Administered 2016-06-24 (×2): 40 meq via ORAL
  Filled 2016-06-24 (×3): qty 2

## 2016-06-24 MED ORDER — POTASSIUM CHLORIDE 20 MEQ/15ML (10%) PO SOLN
40.0000 meq | ORAL | Status: AC
  Administered 2016-06-24 (×2): 40 meq via ORAL
  Filled 2016-06-24 (×2): qty 30

## 2016-06-24 MED ORDER — SODIUM CHLORIDE 0.9 % IV SOLN
INTRAVENOUS | Status: AC
  Administered 2016-06-24: 1000 mL via INTRAVENOUS

## 2016-06-24 MED ORDER — SODIUM CHLORIDE 0.9 % IV SOLN
30.0000 meq | INTRAVENOUS | Status: AC
  Administered 2016-06-24 (×2): 30 meq via INTRAVENOUS
  Filled 2016-06-24 (×2): qty 15

## 2016-06-24 MED ORDER — INSULIN ASPART 100 UNIT/ML ~~LOC~~ SOLN
10.0000 [IU] | Freq: Once | SUBCUTANEOUS | Status: AC
  Administered 2016-06-24: 10 [IU] via SUBCUTANEOUS

## 2016-06-24 MED ORDER — INSULIN ASPART 100 UNIT/ML ~~LOC~~ SOLN
0.0000 [IU] | Freq: Every day | SUBCUTANEOUS | Status: DC
  Administered 2016-06-25: 3 [IU] via SUBCUTANEOUS
  Administered 2016-06-26: 2 [IU] via SUBCUTANEOUS

## 2016-06-24 MED ORDER — INSULIN GLARGINE 100 UNIT/ML ~~LOC~~ SOLN
35.0000 [IU] | Freq: Every day | SUBCUTANEOUS | Status: DC
  Administered 2016-06-24: 35 [IU] via SUBCUTANEOUS
  Filled 2016-06-24 (×2): qty 0.35

## 2016-06-24 MED ORDER — INSULIN ASPART 100 UNIT/ML ~~LOC~~ SOLN
0.0000 [IU] | Freq: Three times a day (TID) | SUBCUTANEOUS | Status: DC
  Administered 2016-06-24: 8 [IU] via SUBCUTANEOUS
  Administered 2016-06-25: 15 [IU] via SUBCUTANEOUS
  Administered 2016-06-25: 11 [IU] via SUBCUTANEOUS
  Administered 2016-06-25 – 2016-06-26 (×2): 15 [IU] via SUBCUTANEOUS
  Administered 2016-06-26: 8 [IU] via SUBCUTANEOUS
  Administered 2016-06-26: 11 [IU] via SUBCUTANEOUS
  Administered 2016-06-27 (×2): 8 [IU] via SUBCUTANEOUS

## 2016-06-24 NOTE — Progress Notes (Signed)
Report given to [facility] on 5W. Pt transferred via nurse, bed, and IV pole. VS WNL. Pt has called fiance Victorino Dike so she is aware of pt's new bed.

## 2016-06-24 NOTE — Progress Notes (Signed)
Inpatient Diabetes Program Recommendations  AACE/ADA: New Consensus Statement on Inpatient Glycemic Control (2015)  Target Ranges:  Prepandial:   less than 140 mg/dL      Peak postprandial:   less than 180 mg/dL (1-2 hours)      Critically ill patients:  140 - 180 mg/dL   Lab Results  Component Value Date   GLUCAP 167 (H) 06/24/2016   HGBA1C 12.2 (H) 06/14/2016    Review of Glycemic Control  Diabetes history: DM x 1 weeks Outpatient Diabetes medications: Metformin 500 mg bid Current orders for Inpatient glycemic control: IV insulin drip  Inpatient Diabetes Program Recommendations:  Spoke with patient and wife @ bedside about new diagnosis. Discussed A1C results with them and explained what an A1C is, basic pathophysiology of DM Type 2, basic home care, basic diabetes diet nutrition principles, importance of checking CBGs and maintaining good CBG control to prevent long-term and short-term complications. Reviewed signs and symptoms of hyperglycemia and hypoglycemia and how to treat hypoglycemia at home. Also reviewed blood sugar goals at home.  RNs to provide ongoing basic DM education at bedside with this patient. Have ordered educational booklet, insulin starter kit, and DM videos. Have also placed RD consult for DM diet education for this patient.  Gave wife and patient information on Relion meter. Wife bought patient a Freestyle meter and strips when recently dx. Will follow.  Thank you, Nani Gasser. Liahna Brickner, RN, MSN, CDE  Diabetes Coordinator Inpatient Glycemic Control Team Team Pager [phone] (8am-5pm) 06/24/2016 11:59 AM

## 2016-06-24 NOTE — Progress Notes (Signed)
CRITICAL VALUE ALERT  Critical value received:  Potassium 2.2  Date of notification:  06/24/2016  Time of notification:  1355  Critical value read back:Yes.    Nurse who received alert:  Lillia Corporal  MD notified (1st page):  Dr. Jomarie Longs  Time of first page:  1350  MD notified (2nd page):  Time of second page:  Responding MD:  Dr. Jomarie Longs  Time MD responded:  [phone]

## 2016-06-24 NOTE — Progress Notes (Signed)
PROGRESS NOTE    Luis Boyer  BMW:[id number] DOB: April 21, 1990 DOA: 06/22/2016 PCP: Donato Schultz, DO  Brief Narrative: 25/M diagnosed with DM 1 week ago, Hba1c of 12, admitted with Severe DKA, N/V and discharge from umbilicus  Assessment & Plan:   1. Severe DKA, N/V Improving, anion gap and bicarb improving -continue Insulin gtt, IVF, KCL, Bmet Q6, start lantus 33units once gap closes -DM coordinator consult appreciated, will need Insulin at DC-uninsured  2. severe hypokalemia -due to insulin, replaced, improving  3. Hyponatremia -due to above -improved  4. Mild peri umbilical cellulitis -stop Zosyn, much improved, CT abd unremarkable -change to Keflex today for 2 doses  5. Obesity -needs lifestyle modification  DVT prophylaxis: Lovenox Code Status: Full Family Communication: none at bedside Disposition Plan: Tx to [facility] once off insulin gtt  Consultants:     Antimicrobials:   Zosyn 4/18   Subjective: Feels ok, hungry  Objective: Vitals:   06/24/16 0731 06/24/16 0800 06/24/16 0900 06/24/16 1000  BP:  (!) 120/57 132/81 123/73  Pulse:  93 91 92  Resp:  (!) Temp: 97.4 F (36.3 C)     TempSrc: Oral     SpO2:  97% 99% 97%  Weight:      Height:        Intake/Output Summary (Last 24 hours) at 06/24/16 1046 Last data filed at 06/24/16 1000  Gross per 24 hour  Intake          4201.11 ml  Output             3470 ml  Net           731.11 ml   Filed Weights   06/22/16 1807 06/23/16 0600  Weight: (!) 167.8 kg (370 lb) (!) 165.1 kg (363 lb 15.7 oz)    Examination:  General exam: Appears calm and comfortable, obese, no distress Respiratory system: Clear to auscultation. Respiratory effort normal. Cardiovascular system: S1 & S2 heard, RRR. No JVD, murmurs, rubs, gallops or clicks. No pedal edema. Gastrointestinal system: Abdomen is nondistended, soft and nontender. Normal bowel sounds heard. Umbilicus with much improved drainage/some  discoloration from dried secretions noted Central nervous system: Alert and oriented. No focal neurological deficits. Extremities: Symmetric 5 x 5 power. Skin: No rashes, lesions or ulcers Psychiatry: Judgement and insight appear normal. Mood & affect appropriate.     Data Reviewed:   CBC:  Recent Labs Lab 06/22/16 1822 06/23/16 1219  WBC 25.0* 12.1*  HGB 17.1* 14.5  HCT 47.7 40.7  MCV 81.4 78.7  PLT 342 206   Basic Metabolic Panel:  Recent Labs Lab 06/23/16 0704 06/23/16 1052 06/23/16 1808 06/23/16 2158 06/24/16 0310  NA 130* 132* 130* 134* 136  K 2.4* 2.6* 3.9 3.6 3.3*  CL 99* 101 103 103 103  CO2 13* 15* 15* 17* 17*  GLUCOSE 294* 215* 170* 156* 162*  BUN 5* <5*  CREATININE 1.39* 1.17 1.06 1.07 0.97  CALCIUM 8.4* 8.7* 8.9 9.5 9.5   GFR: Estimated Creatinine Clearance: 183.8 mL/min (by C-G formula based on SCr of 0.97 mg/dL). Liver Function Tests:  Recent Labs Lab 06/22/16 1822  AST 57*  ALT 34  ALKPHOS 97  BILITOT 3.2*  PROT 8.3*  ALBUMIN 4.5    Recent Labs Lab 06/22/16 1822  LIPASE 110*   No results for input(s): AMMONIA in the last 168 hours. Coagulation Profile: No results for input(s): INR, PROTIME in the last 168  hours. Cardiac Enzymes: No results for input(s): CKTOTAL, CKMB, CKMBINDEX, TROPONINI in the last 168 hours. BNP (last 3 results) No results for input(s): PROBNP in the last 8760 hours. HbA1C: No results for input(s): HGBA1C in the last 72 hours. CBG:  Recent Labs Lab 06/24/16 0604 06/24/16 0700 06/24/16 0803 06/24/16 0858 06/24/16 0959  GLUCAP 170* 159* 160* 160* 161*   Lipid Profile: No results for input(s): CHOL, HDL, LDLCALC, TRIG, CHOLHDL, LDLDIRECT in the last 72 hours. Thyroid Function Tests: No results for input(s): TSH, T4TOTAL, FREET4, T3FREE, THYROIDAB in the last 72 hours. Anemia Panel: No results for input(s): VITAMINB12, FOLATE, FERRITIN, TIBC, IRON, RETICCTPCT in the last 72 hours. Urine  analysis:    Component Value Date/Time   COLORURINE YELLOW 06/23/2016 0023   APPEARANCEUR CLEAR 06/23/2016 0023   LABSPEC 1.038 (H) 06/23/2016 0023   PHURINE 5.0 06/23/2016 0023   GLUCOSEU >=500 (A) 06/23/2016 0023   HGBUR SMALL (A) 06/23/2016 0023   BILIRUBINUR NEGATIVE 06/23/2016 0023   BILIRUBINUR neg 04/19/2014 1714   KETONESUR 80 (A) 06/23/2016 0023   PROTEINUR 100 (A) 06/23/2016 0023   UROBILINOGEN negative 04/19/2014 1714   NITRITE NEGATIVE 06/23/2016 0023   LEUKOCYTESUR NEGATIVE 06/23/2016 0023   Sepsis Labs: (procalcitonin:4,lacticidven:4)  ) Recent Results (from the past 240 hour(s))  Blood culture (routine x 2)     Status: None (Preliminary result)   Collection Time: 06/22/16  9:10 PM  Result Value Ref Range Status   Specimen Description BLOOD LEFT HAND  Final   Special Requests IN PEDIATRIC BOTTLE Blood Culture adequate volume  Final   Culture NO GROWTH < 24 HOURS  Final   Report Status PENDING  Incomplete  Blood culture (routine x 2)     Status: None (Preliminary result)   Collection Time: 06/22/16  9:33 PM  Result Value Ref Range Status   Specimen Description BLOOD RIGHT HAND  Final   Special Requests   Final    BOTTLES DRAWN AEROBIC ONLY Blood Culture adequate volume   Culture NO GROWTH < 24 HOURS  Final   Report Status PENDING  Incomplete  MRSA PCR Screening     Status: None   Collection Time: 06/23/16  6:23 AM  Result Value Ref Range Status   MRSA by PCR NEGATIVE NEGATIVE Final    Comment:        The GeneXpert MRSA Assay (FDA approved for NASAL specimens only), is one component of a comprehensive MRSA colonization surveillance program. It is not intended to diagnose MRSA infection nor to guide or monitor treatment for MRSA infections.          Radiology Studies: Dg Chest 2 View  Result Date: 06/22/2016 CLINICAL DATA:  Acute onset of shortness of breath. Initial encounter. EXAM: CHEST  2 VIEW COMPARISON:  Chest radiograph from  07/12/2013 FINDINGS: The lungs are well-aerated and clear. There is no evidence of focal opacification, pleural effusion or pneumothorax. The heart is normal in size; the mediastinal contour is within normal limits. No acute osseous abnormalities are seen. IMPRESSION: No acute cardiopulmonary process seen. Electronically Signed   By: Roanna Raider M.D.   On: 06/22/2016 19:02   Ct Abdomen Pelvis W Contrast  Result Date: 06/23/2016 CLINICAL DATA:  Abdominal pain with nausea, vomiting, and peer lung drainage from the umbilicus. EXAM: CT ABDOMEN AND PELVIS WITH CONTRAST TECHNIQUE: Multidetector CT imaging of the abdomen and pelvis was performed using the standard protocol following bolus administration of intravenous contrast. CONTRAST:  ISOVUE-300 IOPAMIDOL (ISOVUE-300)  INJECTION 61% COMPARISON:  01/17/2003 FINDINGS: Lower chest: Lung bases are clear. Hepatobiliary: Diffuse fatty infiltration of the liver. No focal lesions identified. Gallbladder and bile ducts are unremarkable. Pancreas: Unremarkable. No pancreatic ductal dilatation or surrounding inflammatory changes. Spleen: Normal in size without focal abnormality. Adrenals/Urinary Tract: Adrenal glands are unremarkable. Kidneys are normal, without renal calculi, focal lesion, or hydronephrosis. Bladder is unremarkable. Stomach/Bowel: Stomach and small bowel are decompressed. Scattered stool throughout the colon. No small or large bowel distention. No wall thickening is appreciated. Appendix is normal. Vascular/Lymphatic: No significant vascular findings are present. No enlarged abdominal or pelvic lymph nodes. Reproductive: Prostate is unremarkable. Other: Small periumbilical hernia containing fat. No subcutaneous abscess or infiltration is identified. No abnormality is identified that would account for purulent drainage from the umbilicus. No free air or free fluid in the abdomen. Musculoskeletal: Normal alignment of the lumbar spine. No destructive  bone lesions appreciated. IMPRESSION: No evidence of abscess or fluid collection in the subcutaneous fat. Small periumbilical hernia containing fat. No bowel herniation or obstruction. Prominent fatty infiltration of the liver. Electronically Signed   By: Burman Nieves M.D.   On: 06/23/2016 00:45        Scheduled Meds: . enoxaparin (LOVENOX) injection  0.5 mg/kg Subcutaneous Daily  . mouth rinse  15 mL Mouth Rinse BID  . nystatin   Topical TID  . potassium chloride  40 mEq Oral TID WC   Continuous Infusions: . dextrose 5 % and 0.45 % NaCl with KCl 40 mEq/L 125 mL/hr at 06/24/16 0744  . insulin (NOVOLIN-R) infusion 10.1 Units/hr (06/24/16 1000)  . piperacillin-tazobactam (ZOSYN)  IV 3.375 g (06/24/16 0901)     LOS: 1 day    Time spent:    Zannie Cove, MD [facility] Pager [phone]  If 7PM-7AM, please contact night-coverage [email or web address] Password [facility] 06/24/2016, 10:46 AM

## 2016-06-24 NOTE — Progress Notes (Signed)
CRITICAL VALUE ALERT  Critical value received:CBG 415   Date of notification:  06/24/16  Time of notification:  22:20  Critical value read back:Yes.    Nurse who received alert:  Delcie Roch  MD notified (1st page)[facility]   Time of first page:  22:30  MD notified (2nd page):None needed  Time of second page:None needed  Responding [place]:OVZCH, NP  [phone] Luis Boyer, Luis Boyer Has a CBG of 415. I have ordered stat lab per protocol. Need an order for insulin. Thank you. Awaiting order.   Time MD responded:  22:30Adult ICU Glycemic Control Treatment Guidelines Sidebar Report  ICU Glycemic Control Protocol Subcutaneous insulin (Phase 1)  1. Monitor CBG every 4 hours 2. Administer correction coverage with insulin aspart (NOVOLOG) subcutaneously every 4 hours according to the sliding scale 3. If CBG < 70 mg/dL use Hypoglycemia Order Set a. In Order Management under Order Sets search for Hypoglycemia Order Set b. Place orders per protocol, cosign required 4. If there is one CBG > 250 mg/dL or two subsequent CBG > 200 mg/dL a. Search for ICU Glycemic Control Order Set (Phase 2) in Order Management under Order Sets and place orders per protocol, cosign required. b. In Order Management select Active Orders and View by Order Set. Discontinue ALL orders from ICU Glycemic Control Order Set (Phase 1) per protocol, cosign required.  ICU Glycemic Control Protocol IV insulin (Phase 2)  1. Monitor CBG every 1 hour 2. Administer regular insulin intravenously per [employer] (MDN-CGS) software. . Set high target = 180, low target = 140, multiplier = 0.01 . Do NOT use glucose lab values (use CBG readings only) . If CBG reads "Critical High", enter 601 . If on TF / TNA and it is discontinued, enter original multiplier 3. If CBG < 70  . Follow GlucoStabilizer Instructions . Recheck POCT CBG in 15 minutes . Change multiplier to 0.01 4. Notify MD if . Prompted by GlucoStabilizer:   . If insulin drip  rate is greater than 30 units/hour, stop insulin drip for 30 minutes and restart GS with original multiplier 0.01 . CBG<70 after treating hypoglycemia twice . CBG<250 and there is no IV dextrose, TNA or TF . Withholding insulin for more then 1 hour  5. Initiate Phase III (Transition ICU Glycemic Control Order Set) when all of the following are met: .  the insulin infusion rate is < 4 units / hour .  tube feeds are at goal rate and stable  .  there are 6 subsequent CBG readings < 180 mg/dL 6. If patient is on TNA, await further steps until TNA pharmacist sees patient.  . In Order Management under Order Sets search for ICU Glycemic Control Order Set (Phase 3).  Initiate all orders from ICU Glycemic Control Order Set (Phase 2) per protocol, cosign required. . In Order Management select Active Orders and View by Order Set. Discontinue ALL orders form ICU Glycemic Control Order Set (Phase II) per protocol, cosign required.  ICU Glycemic Control Protocol Transition (Phase 3) **Choose appropriate Lantus and tube feed coverage based on last insulin drip rate.  Choose correction Novolog based on patients most recent glomerular filtration rate (GFR).  If GFR less than 30 choose sensitive, and if GFR greater than 30, choose standard. Please verify insulin drip rate/transition doses with 2nd RN.  1. Give first dose of Lantus now and q 24 hours per last insulin drip rate, and discontinue insulin drip 2 hours after Lantus given. 2. Monitor CBG every 4 hours  3. Administer correction insulin (NOVOLOG) [place] according to sliding scale 4. Administer tube feeding coverage (NOVOLOG) as ordered [place] every 4 hours after tubes feeds are at goal. If patient is not receiving TF, it is not at goal, or coverage is not ordered - skip this step.   5. If tube feeding / TNA is stopped, start D10 infusion at 40 mL/h and hold tube feeding coverage.    6. If there is one CBG reading > 250 mg/dL or two subsequent CBG readings > 200  mg/dL switch to ICU Glycemic Control Order Set (Phase II) a. In Order Management select Active Orders and View by Order Set. Discontinue ALL orders form ICU Glycemic Control Order Set (Phase III) per protocol, cosign required. 7. In Order Management under Order Sets search for ICU Glycemic Control Order Set (Phase II).  Initiate ALL orders form ICU Glycemic Control Order Set (Phase II) per protocol, cosign required.

## 2016-06-24 NOTE — Progress Notes (Signed)
Pt has pulled out one IV and IV team is recommending a PICC due to pt being a hard stick. Pt has 1 IV left and Dr. Jomarie Longs wants to keep pt with one for now.

## 2016-06-25 LAB — GLUCOSE, RANDOM: Glucose, Bld: 382 mg/dL — ABNORMAL HIGH (ref 65–99)

## 2016-06-25 LAB — BASIC METABOLIC PANEL
ANION GAP: 16 — AB (ref 5–15)
BUN: <5 mg/dL — ABNORMAL LOW (ref 6–20)
CHLORIDE: 92 mmol/L — AB (ref 101–111)
CO2: 24 mmol/L (ref 22–32)
CREATININE: 0.91 mg/dL (ref 0.61–1.24)
Calcium: 9.6 mg/dL (ref 8.9–10.3)
GFR calc non Af Amer: >60 mL/min (ref >60)
Glucose, Bld: 329 mg/dL — ABNORMAL HIGH (ref 65–99)
Potassium: 2.5 mmol/L — CL (ref 3.5–5.1)
Sodium: 132 mmol/L — ABNORMAL LOW (ref 135–145)

## 2016-06-25 LAB — GLUCOSE, CAPILLARY
GLUCOSE-CAPILLARY: 331 mg/dL — AB (ref 65–99)
GLUCOSE-CAPILLARY: 296 mg/dL — AB (ref 65–99)
Glucose-Capillary: 385 mg/dL — ABNORMAL HIGH (ref 65–99)
Glucose-Capillary: 399 mg/dL — ABNORMAL HIGH (ref 65–99)

## 2016-06-25 MED ORDER — INSULIN GLARGINE 100 UNIT/ML ~~LOC~~ SOLN
50.0000 [IU] | Freq: Every day | SUBCUTANEOUS | Status: DC
  Administered 2016-06-25: 50 [IU] via SUBCUTANEOUS
  Filled 2016-06-25 (×2): qty 0.5

## 2016-06-25 MED ORDER — INSULIN ASPART 100 UNIT/ML ~~LOC~~ SOLN
5.0000 [IU] | Freq: Three times a day (TID) | SUBCUTANEOUS | Status: DC
  Administered 2016-06-25 – 2016-06-26 (×4): 5 [IU] via SUBCUTANEOUS

## 2016-06-25 MED ORDER — LIVING WELL WITH DIABETES BOOK
Freq: Once | Status: AC
  Administered 2016-06-25: 22:00:00
  Filled 2016-06-25: qty 1

## 2016-06-25 MED ORDER — POTASSIUM CHLORIDE 20 MEQ/15ML (10%) PO SOLN
40.0000 meq | ORAL | Status: AC
  Administered 2016-06-25 (×2): 40 meq via ORAL
  Filled 2016-06-25 (×2): qty 30

## 2016-06-25 MED ORDER — SODIUM CHLORIDE 0.9 % IV SOLN
30.0000 meq | INTRAVENOUS | Status: AC
  Administered 2016-06-25 (×2): 30 meq via INTRAVENOUS
  Filled 2016-06-25 (×2): qty 15

## 2016-06-25 NOTE — Progress Notes (Signed)
Spoke with patient briefly at bedside.  He states that he has been giving insulin injections and plans to follow-up with MD on Monday-06/28/16.  He has MATCH letter.  Also discussed patient assistance programs for insulins.  He states he has a meter and has no further questions at this time.    Thanks, Beryl Meager, RN, BC-ADM Inpatient Diabetes Coordinator Pager [phone] (8a-5p)

## 2016-06-25 NOTE — Progress Notes (Signed)
Handouts given to patient on DKA and DM.

## 2016-06-25 NOTE — Plan of Care (Signed)
Problem: Food- and Nutrition-Related Knowledge Deficit (NB-1.1) Goal: Nutrition education Formal process to instruct or train a patient/client in a skill or to impart knowledge to help patients/clients voluntarily manage or modify food choices and eating behavior to maintain or improve health. Outcome: Adequate for Discharge  RD consulted for nutrition education regarding diabetes.   Lab Results  Component Value Date   HGBA1C 12.2 (H) 06/14/2016   Outpatient Diabetes medications: Metformin 500 mg bid  Pt watching DM education videos at time of visit. He shares that he has received a lot of information thus far from DM coordinator and RN, however, has a very positive attitude towards making lifestyle changes. He shares that his fiance, who was not present at time of visit, will also be supporting him. He reports he will transition to diet soft drinks and water as beverages at discharge. PTA he reports "binge eating" one meal daily and consuming a lot of sugary beverages ("everything I ate was unhealthy").   RD provided "Carbohydrate Counting for People with Diabetes" handout from the Academy of Nutrition and Dietetics. Discussed different food groups and their effects on blood sugar, emphasizing carbohydrate-containing foods. Provided list of carbohydrates and recommended serving sizes of common foods.  Discussed importance of controlled and consistent carbohydrate intake throughout the day. Provided examples of ways to balance meals/snacks and encouraged intake of high-fiber, whole grain complex carbohydrates. Teach back method used.  Expect fair to good compliance.  Body mass index is 49.36 kg/m. Pt meets criteria for extreme obesity, class III based on current BMI.  Current diet order is carb modified, patient is consuming approximately 100% of meals at this time. Labs and medications reviewed. No further nutrition interventions warranted at this time. RD contact information provided. If  additional nutrition issues arise, please re-consult RD.  Leondra Cullin A. Mayford Knife, RD, LDN, CDE Pager: [phone] After hours Pager: [phone]

## 2016-06-25 NOTE — Progress Notes (Signed)
PROGRESS NOTE    Luis Boyer  ZOX:[id number] DOB: 1990-07-10 DOA: 06/22/2016 PCP: Donato Schultz, DO  Brief Narrative: 25/M diagnosed with DM 1 week ago, Hba1c of 12, admitted with Severe DKA, N/V and discharge from umbilicus  Assessment & Plan:   1. Severe DKA, N/V -Improving, anion gap closed -off Insulin gtt, IVF,  -increase lantus, SSI -appreciate DM coordinator input  2. Severe hypokalemia -due to insulin, replace  3. Hyponatremia -due to above -improved  4. Mild peri umbilical cellulitis -stopped Zosyn, much improved, CT abd unremarkable -change to Keflex today for 2 doses  5. Obesity -needs lifestyle modification  DVT prophylaxis: Lovenox Code Status: Full Family Communication: none at bedside Disposition Plan: home in 1-2days  Consultants:     Antimicrobials:   Zosyn 4/18   Subjective: Feels tired, didn't sleep much last pm  Objective: Vitals:   06/24/16 1712 06/24/16 2157 06/25/16 0714 06/25/16 1423  BP: (!) 153/78 (!) 162/69 (!) 152/66 (!) 147/63  Pulse: (!) 113 (!) 113 96 (!) 104  Resp: Temp: 98.7 F (37.1 C) 98.6 F (37 C) 98.4 F (36.9 C) 98.1 F (36.7 C)  TempSrc: Oral Oral    SpO2: 97% 99% 99% 100%  Weight:      Height:        Intake/Output Summary (Last 24 hours) at 06/25/16 1427 Last data filed at 06/25/16 8119  Gross per 24 hour  Intake           251.43 ml  Output                0 ml  Net           251.43 ml   Filed Weights   06/22/16 1807 06/23/16 0600  Weight: (!) 167.8 kg (370 lb) (!) 165.1 kg (363 lb 15.7 oz)    Examination:  General exam: Appears calm and comfortable, obese, no distress Respiratory system: Clear to auscultation. Respiratory effort normal. Cardiovascular system: S1 & S2 heard, RRR. No JVD, murmurs, rubs, gallops or clicks. No pedal edema. Gastrointestinal system: Abdomen is nondistended, soft and nontender. Normal bowel sounds heard. Umbilicus with much improved drainage/some  discoloration from dried secretions noted Central nervous system: Alert and oriented. No focal neurological deficits. Extremities: Symmetric 5 x 5 power. Skin: No rashes, lesions or ulcers Psychiatry: Judgement and insight appear normal. Mood & affect appropriate.     Data Reviewed:   CBC:  Recent Labs Lab 06/22/16 1822 06/23/16 1219  WBC 25.0* 12.1*  HGB 17.1* 14.5  HCT 47.7 40.7  MCV 81.4 78.7  PLT 342 206   Basic Metabolic Panel:  Recent Labs Lab 06/24/16 0310 06/24/16 1243 06/24/16 1932 06/24/16 2056 06/25/16 0014 06/25/16 0459  NA 136 131* 130* 130*  --  132*  K 3.3* 2.2* 3.4* 3.3*  --  2.5*  CL 103 96* 96* 96*  --  92*  CO2 17* 24 21* 22  --  24  GLUCOSE 162* 225* 380* 426* 382* 329*  BUN <5* <5* 6 6  --  <5*  CREATININE 0.97 0.94 1.02 0.95  --  0.91  CALCIUM 9.5 9.3 9.7 9.5  --  9.6   GFR: Estimated Creatinine Clearance: 195.9 mL/min (by C-G formula based on SCr of 0.91 mg/dL). Liver Function Tests:  Recent Labs Lab 06/22/16 1822  AST 57*  ALT 34  ALKPHOS 97  BILITOT 3.2*  PROT 8.3*  ALBUMIN 4.5    Recent Labs Lab  06/22/16 1822  LIPASE 110*   No results for input(s): AMMONIA in the last 168 hours. Coagulation Profile: No results for input(s): INR, PROTIME in the last 168 hours. Cardiac Enzymes: No results for input(s): CKTOTAL, CKMB, CKMBINDEX, TROPONINI in the last 168 hours. BNP (last 3 results) No results for input(s): PROBNP in the last 8760 hours. HbA1C: No results for input(s): HGBA1C in the last 72 hours. CBG:  Recent Labs Lab 06/24/16 1600 06/24/16 1751 06/24/16 2156 06/25/16 0823 06/25/16 1239  GLUCAP 210* 276* 415* 331* 385*   Lipid Profile: No results for input(s): CHOL, HDL, LDLCALC, TRIG, CHOLHDL, LDLDIRECT in the last 72 hours. Thyroid Function Tests: No results for input(s): TSH, T4TOTAL, FREET4, T3FREE, THYROIDAB in the last 72 hours. Anemia Panel: No results for input(s): VITAMINB12, FOLATE, FERRITIN, TIBC,  IRON, RETICCTPCT in the last 72 hours. Urine analysis:    Component Value Date/Time   COLORURINE YELLOW 06/23/2016 0023   APPEARANCEUR CLEAR 06/23/2016 0023   LABSPEC 1.038 (H) 06/23/2016 0023   PHURINE 5.0 06/23/2016 0023   GLUCOSEU >=500 (A) 06/23/2016 0023   HGBUR SMALL (A) 06/23/2016 0023   BILIRUBINUR NEGATIVE 06/23/2016 0023   BILIRUBINUR neg 04/19/2014 1714   KETONESUR 80 (A) 06/23/2016 0023   PROTEINUR 100 (A) 06/23/2016 0023   UROBILINOGEN negative 04/19/2014 1714   NITRITE NEGATIVE 06/23/2016 0023   LEUKOCYTESUR NEGATIVE 06/23/2016 0023   Sepsis Labs: (procalcitonin:4,lacticidven:4)  ) Recent Results (from the past 240 hour(s))  Blood culture (routine x 2)     Status: None (Preliminary result)   Collection Time: 06/22/16  9:10 PM  Result Value Ref Range Status   Specimen Description BLOOD LEFT HAND  Final   Special Requests IN PEDIATRIC BOTTLE Blood Culture adequate volume  Final   Culture NO GROWTH 2 DAYS  Final   Report Status PENDING  Incomplete  Blood culture (routine x 2)     Status: None (Preliminary result)   Collection Time: 06/22/16  9:33 PM  Result Value Ref Range Status   Specimen Description BLOOD RIGHT HAND  Final   Special Requests   Final    BOTTLES DRAWN AEROBIC ONLY Blood Culture adequate volume   Culture NO GROWTH 2 DAYS  Final   Report Status PENDING  Incomplete  MRSA PCR Screening     Status: None   Collection Time: 06/23/16  6:23 AM  Result Value Ref Range Status   MRSA by PCR NEGATIVE NEGATIVE Final    Comment:        The GeneXpert MRSA Assay (FDA approved for NASAL specimens only), is one component of a comprehensive MRSA colonization surveillance program. It is not intended to diagnose MRSA infection nor to guide or monitor treatment for MRSA infections.          Radiology Studies: No results found.      Scheduled Meds: . enoxaparin (LOVENOX) injection  0.5 mg/kg Subcutaneous Daily  . insulin aspart  0-15  Units Subcutaneous TID WC  . insulin aspart  0-5 Units Subcutaneous QHS  . insulin aspart  5 Units Subcutaneous TID WC  . insulin glargine  50 Units Subcutaneous Daily  . mouth rinse  15 mL Mouth Rinse BID  . nystatin   Topical TID   Continuous Infusions: . potassium chloride (KCL MULTIRUN) 30 mEq in 265 mL IVPB 30 mEq (06/25/16 1256)     LOS: 2 days    Time spent:    Zannie Cove, MD [facility] Pager [phone]  If 7PM-7AM, please  contact night-coverage [email or web address] Password [email or web address] 06/25/2016, 2:27 PM

## 2016-06-25 NOTE — Care Management Note (Signed)
Case Management Note  Patient Details  Name: KENAN MOODIE MRN: [id number] Date of Birth: 1990/11/24  Subjective/Objective:                 Patient admitted with new Dx DM. Patient is uninsured. Patient can be provided with [facility] letter at discharge to assist for the first 30 days of insulin if needed.  MD please consider the following for discharge in regard to insulin: [facility] can fill insulin syringes and a meter (matching strips are more expensive than [employer]'s brand) at our [facility] OP pharmacies ONLY with the prescriptions, M-F only. Patient can also get Novolin N, R, 70/30 VIALS for around $25. [employer] meter ~$20, and strips are ~$10 (lowest price).   PCP: Zola Button, [place] [facility]  [address] [place], [place] [address] [phone]    Action/Plan:   Expected Discharge Date:                  Expected Discharge Plan:  Home/Self Care  In-House Referral:     Discharge planning Services  CM Consult  Post Acute Care Choice:    Choice offered to:     DME Arranged:    DME Agency:     HH Arranged:    HH Agency:     Status of Service:  In process, will continue to follow  If discussed at Long Length of Stay Meetings, dates discussed:    Additional Comments:  Lawerance Sabal, RN 06/25/2016, 10:52 AM

## 2016-06-26 LAB — BASIC METABOLIC PANEL
ANION GAP: 12 (ref 5–15)
Anion gap: 14 (ref 5–15)
BUN: <5 mg/dL — ABNORMAL LOW (ref 6–20)
BUN: <5 mg/dL — ABNORMAL LOW (ref 6–20)
CHLORIDE: 94 mmol/L — AB (ref 101–111)
CHLORIDE: 95 mmol/L — AB (ref 101–111)
CO2: 27 mmol/L (ref 22–32)
CO2: 26 mmol/L (ref 22–32)
CREATININE: 0.72 mg/dL (ref 0.61–1.24)
Calcium: 9.2 mg/dL (ref 8.9–10.3)
Calcium: 9.6 mg/dL (ref 8.9–10.3)
Creatinine, Ser: 0.94 mg/dL (ref 0.61–1.24)
GFR calc Af Amer: >60 mL/min (ref >60)
GFR calc Af Amer: >60 mL/min (ref >60)
GFR calc non Af Amer: >60 mL/min (ref >60)
GLUCOSE: 284 mg/dL — AB (ref 65–99)
GLUCOSE: 367 mg/dL — AB (ref 65–99)
POTASSIUM: 3.2 mmol/L — AB (ref 3.5–5.1)
Potassium: 2.5 mmol/L — CL (ref 3.5–5.1)
SODIUM: 135 mmol/L (ref 135–145)
Sodium: 133 mmol/L — ABNORMAL LOW (ref 135–145)

## 2016-06-26 LAB — GLUCOSE, CAPILLARY
GLUCOSE-CAPILLARY: 292 mg/dL — AB (ref 65–99)
GLUCOSE-CAPILLARY: 304 mg/dL — AB (ref 65–99)
GLUCOSE-CAPILLARY: 231 mg/dL — AB (ref 65–99)
Glucose-Capillary: 353 mg/dL — ABNORMAL HIGH (ref 65–99)

## 2016-06-26 LAB — MAGNESIUM: Magnesium: 2.3 mg/dL (ref 1.7–2.4)

## 2016-06-26 MED ORDER — ALUM & MAG HYDROXIDE-SIMETH 200-200-20 MG/5ML PO SUSP
30.0000 mL | ORAL | Status: DC | PRN
  Administered 2016-06-26: 30 mL via ORAL
  Filled 2016-06-26: qty 30

## 2016-06-26 MED ORDER — INSULIN ASPART PROT & ASPART (70-30 MIX) 100 UNIT/ML ~~LOC~~ SUSP
40.0000 [IU] | Freq: Two times a day (BID) | SUBCUTANEOUS | Status: DC
  Administered 2016-06-26 (×2): 40 [IU] via SUBCUTANEOUS
  Filled 2016-06-26: qty 10

## 2016-06-26 MED ORDER — POTASSIUM CHLORIDE 20 MEQ/15ML (10%) PO SOLN
40.0000 meq | ORAL | Status: AC
Start: 2016-06-26 — End: 2016-06-27
  Administered 2016-06-26 – 2016-06-27 (×5): 40 meq via ORAL
  Filled 2016-06-26 (×5): qty 30

## 2016-06-26 MED ORDER — BISACODYL 5 MG PO TBEC
5.0000 mg | DELAYED_RELEASE_TABLET | Freq: Every day | ORAL | Status: DC | PRN
  Administered 2016-06-26: 5 mg via ORAL
  Filled 2016-06-26: qty 1

## 2016-06-26 NOTE — Progress Notes (Signed)
PROGRESS NOTE    Luis Boyer  ZOX:[id number] DOB: [birth year] DOA: 06/22/2016 PCP: Donato Schultz, DO  Brief Narrative: 25/M diagnosed with DM 1 week ago, Hba1c of 12, admitted with Severe DKA, N/V and discharge from umbilicus.  Assessment & Plan:   1. Severe DKA, N/V -Improving, anion gap closed -off Insulin gtt, IVF,  -change to Insulin 70/30 to 40units BID -appreciate DM coordinator input  2. Severe hypokalemia -due to insulin, replace   3. Hyponatremia -due to above -improved  4. Mild peri umbilical cellulitis -stopped Zosyn, much improved, CT abd unremarkable -changed to Keflex for 2 doses  5. Obesity -needs lifestyle modification  DVT prophylaxis: Lovenox Code Status: Full Family Communication: none at bedside Disposition Plan: home in 1-2days, when K and CBGs stable  Consultants:     Antimicrobials:   Zosyn 4/18   Subjective: Feels ok, giving himself insulin shots now  Objective: Vitals:   06/25/16 0714 06/25/16 1423 06/25/16 2140 06/26/16 0534  BP: (!) 152/66 (!) 147/63 (!) 153/84 (!) 157/87  Pulse: 96 (!) 104 (!) 111 90  Resp: Temp: 98.4 F (36.9 C) 98.1 F (36.7 C) 98.7 F (37.1 C) 97.5 F (36.4 C)  TempSrc:      SpO2: 99% 100% 100% 97%  Weight:      Height:        Intake/Output Summary (Last 24 hours) at 06/26/16 1411 Last data filed at 06/26/16 0953  Gross per 24 hour  Intake              920 ml  Output                0 ml  Net              920 ml   Filed Weights   06/22/16 1807 06/23/16 0600  Weight: (!) 167.8 kg (370 lb) (!) 165.1 kg (363 lb 15.7 oz)    Examination:  General exam: Appears calm and comfortable, obese, no distress Respiratory system: Clear to auscultation. Respiratory effort normal. Cardiovascular system: S1 & S2 heard, RRR. No JVD, murmurs, rubs, gallops or clicks. No pedal edema. Gastrointestinal system: Abdomen is nondistended, soft and nontender. Normal bowel sounds heard. Umbilicus  with much improved drainage/some discoloration from dried secretions noted Central nervous system: Alert and oriented. No focal neurological deficits. Extremities: Symmetric 5 x 5 power. Skin: No rashes, lesions or ulcers Psychiatry: Judgement and insight appear normal. Mood & affect appropriate.     Data Reviewed:   CBC:  Recent Labs Lab 06/22/16 1822 06/23/16 1219  WBC 25.0* 12.1*  HGB 17.1* 14.5  HCT 47.7 40.7  MCV 81.4 78.7  PLT 342 206   Basic Metabolic Panel:  Recent Labs Lab 06/24/16 1243 06/24/16 1932 06/24/16 2056 06/25/16 0014 06/25/16 0459 06/26/16 0752 06/26/16 0957  NA 131* 130* 130*  --  132* 135  --   K 2.2* 3.4* 3.3*  --  2.5* 2.5*  --   CL 96* 96* 96*  --  92* 94*  --   CO2 24 21* 22  --  24 27  --   GLUCOSE 225* 380* 426* 382* 329* 284*  --   BUN <5* 6 6  --  <5* <5*  --   CREATININE 0.94 1.02 0.95  --  0.91 0.72  --   CALCIUM 9.3 9.7 9.5  --  9.6 9.2  --   MG  --   --   --   --   --   --  2.3   GFR: Estimated Creatinine Clearance: 222.9 mL/min (by C-G formula based on SCr of 0.72 mg/dL). Liver Function Tests:  Recent Labs Lab 06/22/16 1822  AST 57*  ALT 34  ALKPHOS 97  BILITOT 3.2*  PROT 8.3*  ALBUMIN 4.5    Recent Labs Lab 06/22/16 1822  LIPASE 110*   No results for input(s): AMMONIA in the last 168 hours. Coagulation Profile: No results for input(s): INR, PROTIME in the last 168 hours. Cardiac Enzymes: No results for input(s): CKTOTAL, CKMB, CKMBINDEX, TROPONINI in the last 168 hours. BNP (last 3 results) No results for input(s): PROBNP in the last 8760 hours. HbA1C: No results for input(s): HGBA1C in the last 72 hours. CBG:  Recent Labs Lab 06/25/16 1239 06/25/16 1631 06/25/16 2213 06/26/16 0806 06/26/16 1137  GLUCAP 385* 399* 296* 292* 353*   Lipid Profile: No results for input(s): CHOL, HDL, LDLCALC, TRIG, CHOLHDL, LDLDIRECT in the last 72 hours. Thyroid Function Tests: No results for input(s): TSH, T4TOTAL,  FREET4, T3FREE, THYROIDAB in the last 72 hours. Anemia Panel: No results for input(s): VITAMINB12, FOLATE, FERRITIN, TIBC, IRON, RETICCTPCT in the last 72 hours. Urine analysis:    Component Value Date/Time   COLORURINE YELLOW 06/23/2016 0023   APPEARANCEUR CLEAR 06/23/2016 0023   LABSPEC 1.038 (H) 06/23/2016 0023   PHURINE 5.0 06/23/2016 0023   GLUCOSEU >=500 (A) 06/23/2016 0023   HGBUR SMALL (A) 06/23/2016 0023   BILIRUBINUR NEGATIVE 06/23/2016 0023   BILIRUBINUR neg 04/19/2014 1714   KETONESUR 80 (A) 06/23/2016 0023   PROTEINUR 100 (A) 06/23/2016 0023   UROBILINOGEN negative 04/19/2014 1714   NITRITE NEGATIVE 06/23/2016 0023   LEUKOCYTESUR NEGATIVE 06/23/2016 0023   Sepsis Labs: (procalcitonin:4,lacticidven:4)  ) Recent Results (from the past 240 hour(s))  Blood culture (routine x 2)     Status: None (Preliminary result)   Collection Time: 06/22/16  9:10 PM  Result Value Ref Range Status   Specimen Description BLOOD LEFT HAND  Final   Special Requests IN PEDIATRIC BOTTLE Blood Culture adequate volume  Final   Culture NO GROWTH 4 DAYS  Final   Report Status PENDING  Incomplete  Blood culture (routine x 2)     Status: None (Preliminary result)   Collection Time: 06/22/16  9:33 PM  Result Value Ref Range Status   Specimen Description BLOOD RIGHT HAND  Final   Special Requests   Final    BOTTLES DRAWN AEROBIC ONLY Blood Culture adequate volume   Culture NO GROWTH 4 DAYS  Final   Report Status PENDING  Incomplete  MRSA PCR Screening     Status: None   Collection Time: 06/23/16  6:23 AM  Result Value Ref Range Status   MRSA by PCR NEGATIVE NEGATIVE Final    Comment:        The GeneXpert MRSA Assay (FDA approved for NASAL specimens only), is one component of a comprehensive MRSA colonization surveillance program. It is not intended to diagnose MRSA infection nor to guide or monitor treatment for MRSA infections.          Radiology Studies: No  results found.      Scheduled Meds: . enoxaparin (LOVENOX) injection  0.5 mg/kg Subcutaneous Daily  . insulin aspart  0-15 Units Subcutaneous TID WC  . insulin aspart  0-5 Units Subcutaneous QHS  . insulin aspart protamine- aspart  40 Units Subcutaneous BID WC  . mouth rinse  15 mL Mouth Rinse BID  . nystatin   Topical TID  .  potassium chloride  40 mEq Oral Q3H   Continuous Infusions:    LOS: 3 days    Time spent:    Zannie Cove, MD [facility] Pager [phone]  If 7PM-7AM, please contact night-coverage [email or web address] Password [email or web address] 06/26/2016, 2:11 PM

## 2016-06-26 NOTE — Progress Notes (Signed)
Patient gave own shots of insulin this am. Kenard Gower up his own 70/30 as well. Did so without complications. Madelin Rear, MSN, RN, [employer]

## 2016-06-27 LAB — BASIC METABOLIC PANEL
ANION GAP: 11 (ref 5–15)
CALCIUM: 9.7 mg/dL (ref 8.9–10.3)
CO2: 28 mmol/L (ref 22–32)
Chloride: 98 mmol/L — ABNORMAL LOW (ref 101–111)
Creatinine, Ser: 0.88 mg/dL (ref 0.61–1.24)
GFR calc Af Amer: >60 mL/min (ref >60)
GLUCOSE: 235 mg/dL — AB (ref 65–99)
Potassium: 3.5 mmol/L (ref 3.5–5.1)
Sodium: 137 mmol/L (ref 135–145)

## 2016-06-27 LAB — CULTURE, BLOOD (ROUTINE X 2)
CULTURE: NO GROWTH
CULTURE: NO GROWTH
Special Requests: ADEQUATE
Special Requests: ADEQUATE

## 2016-06-27 LAB — GLUCOSE, CAPILLARY
Glucose-Capillary: 259 mg/dL — ABNORMAL HIGH (ref 65–99)
Glucose-Capillary: 292 mg/dL — ABNORMAL HIGH (ref 65–99)

## 2016-06-27 MED ORDER — ENOXAPARIN SODIUM 80 MG/0.8ML ~~LOC~~ SOLN: 80.0000 mg | Freq: Every day | SUBCUTANEOUS | Status: DC

## 2016-06-27 MED ORDER — POTASSIUM CHLORIDE 20 MEQ/15ML (10%) PO SOLN: 40.0000 meq | Freq: Two times a day (BID) | ORAL | 0 refills | Status: DC

## 2016-06-27 MED ORDER — INSULIN ASPART PROT & ASPART (70-30 MIX) 100 UNIT/ML ~~LOC~~ SUSP: 50.0000 [IU] | Freq: Two times a day (BID) | SUBCUTANEOUS | 2 refills | Status: DC

## 2016-06-27 MED ORDER — INSULIN ASPART PROT & ASPART (70-30 MIX) 100 UNIT/ML ~~LOC~~ SUSP
50.0000 [IU] | Freq: Two times a day (BID) | SUBCUTANEOUS | Status: DC
  Administered 2016-06-27: 50 [IU] via SUBCUTANEOUS
  Filled 2016-06-27: qty 10

## 2016-06-27 NOTE — Discharge Summary (Signed)
Physician Discharge Summary  Keyon Liller Jansson OZH:[id number] DOB: 1990/07/22 DOA: 06/22/2016  PCP: Donato Schultz, DO  Admit date: 06/22/2016 Discharge date: 06/27/2016  Time spent: 35 minutes  Recommendations for Outpatient Follow-up:  1. PCP Dr Laury Axon in 1 week, please check Bmet at FU, required large amounts of KCL inpatient, discharged on KCL BID, please check Bmet in 1 week   Discharge Diagnoses:  Principal Problem:   DKA (diabetic ketoacidoses) (HCC) Active Problems:   Cellulitis of abdominal wall   Morbid Obesity   Severe hypokalemia  Discharge Condition: stable  Diet recommendation: diabetic  Filed Weights   06/22/16 1807 06/23/16 0600  Weight: (!) 167.8 kg (370 lb) (!) 165.1 kg (363 lb 15.7 oz)    History of present illness:  25/M diagnosed with DM 1 week ago, Hba1c of 12, admitted with Severe DKA, N/V  Hospital Course:  1. Severe DKA, N/V -Improved, treated with IVF, Insulin gtt -then transitioned to Insulin 70/30 -DM education/insulin teaching completed -increased Insulin 70/30 to 50 units BID at discharge -advised quick FU with PCP  2. Severe hypokalemia -due to insulin, replaced -K persistently low while on Insulin, mag level normal -started on KCL BID at discharge, needs Bmet checked in 1 week  3. Hyponatremia -due to above -improved  4. Mild peri umbilical cellulitis -stopped Zosyn, much improved, CT abd unremarkable -changed to Keflex for 2 doses, completed Rx, resolved  5. Obesity - lifestyle modification advised  Discharge Exam: Vitals:   06/26/16 2215 06/27/16 0547  BP: 127/82 (!) 124/59  Pulse: 98 88  Resp: 18 18  Temp: 98.1 F (36.7 C) 97.8 F (36.6 C)    General: morbidly obese Cardiovascular: S1S2/RRR Respiratory: CTAB  Discharge Instructions   Discharge Instructions    Diet - low sodium heart healthy    Complete by:  As directed    Diet Carb Modified    Complete by:  As directed    Increase activity slowly     Complete by:  As directed      Current Discharge Medication List    START taking these medications   Details  insulin aspart protamine- aspart (NOVOLOG MIX 70/30) (70-30) 100 UNIT/ML injection Inject 0.5 mLs (50 Units total) into the skin 2 (two) times daily with a meal. Qty: 20 mL, Refills: 2    potassium chloride 20 MEQ/15ML (10%) SOLN Take 30 mLs (40 mEq total) by mouth 2 (two) times daily. Qty: 900 mL, Refills: 0      CONTINUE these medications which have NOT CHANGED   Details  ibuprofen (ADVIL,MOTRIN) 200 MG tablet Take 400 mg by mouth every 6 (six) hours as needed (pain).    metFORMIN (GLUCOPHAGE) 500 MG tablet Take 1 tablet (500 mg total) by mouth 2 (two) times daily with a meal. Qty: 60 tablet, Refills: 0    Multiple Vitamin (MULTIVITAMIN WITH MINERALS) TABS tablet Take 1 tablet by mouth daily.       Allergies  Allergen Reactions  . Celebrex [Celecoxib] Shortness Of Breath  . Ace Inhibitors Other (See Comments)    angioedema   Follow-up Information    Donato Schultz, DO. Schedule an appointment as soon as possible for a visit in 1 week(s).   Specialty:  Family Medicine Why:  please have PCP check labs-Bmet/potassium in 1 week Contact information: [address] [place] [address] [phone]            The results of significant diagnostics from  this hospitalization (including imaging, microbiology, ancillary and laboratory) are listed below for reference.    Significant Diagnostic Studies: Dg Chest 2 View  Result Date: 06/22/2016 CLINICAL DATA:  Acute onset of shortness of breath. Initial encounter. EXAM: CHEST  2 VIEW COMPARISON:  Chest radiograph from 07/12/2013 FINDINGS: The lungs are well-aerated and clear. There is no evidence of focal opacification, pleural effusion or pneumothorax. The heart is normal in size; the mediastinal contour is within normal limits. No acute osseous abnormalities are seen. IMPRESSION: No acute  cardiopulmonary process seen. Electronically Signed   By: Roanna Raider M.D.   On: 06/22/2016 19:02   Ct Abdomen Pelvis W Contrast  Result Date: 06/23/2016 CLINICAL DATA:  Abdominal pain with nausea, vomiting, and peer lung drainage from the umbilicus. EXAM: CT ABDOMEN AND PELVIS WITH CONTRAST TECHNIQUE: Multidetector CT imaging of the abdomen and pelvis was performed using the standard protocol following bolus administration of intravenous contrast. CONTRAST:  ISOVUE-300 IOPAMIDOL (ISOVUE-300) INJECTION 61% COMPARISON:  01/17/2003 FINDINGS: Lower chest: Lung bases are clear. Hepatobiliary: Diffuse fatty infiltration of the liver. No focal lesions identified. Gallbladder and bile ducts are unremarkable. Pancreas: Unremarkable. No pancreatic ductal dilatation or surrounding inflammatory changes. Spleen: Normal in size without focal abnormality. Adrenals/Urinary Tract: Adrenal glands are unremarkable. Kidneys are normal, without renal calculi, focal lesion, or hydronephrosis. Bladder is unremarkable. Stomach/Bowel: Stomach and small bowel are decompressed. Scattered stool throughout the colon. No small or large bowel distention. No wall thickening is appreciated. Appendix is normal. Vascular/Lymphatic: No significant vascular findings are present. No enlarged abdominal or pelvic lymph nodes. Reproductive: Prostate is unremarkable. Other: Small periumbilical hernia containing fat. No subcutaneous abscess or infiltration is identified. No abnormality is identified that would account for purulent drainage from the umbilicus. No free air or free fluid in the abdomen. Musculoskeletal: Normal alignment of the lumbar spine. No destructive bone lesions appreciated. IMPRESSION: No evidence of abscess or fluid collection in the subcutaneous fat. Small periumbilical hernia containing fat. No bowel herniation or obstruction. Prominent fatty infiltration of the liver. Electronically Signed   By: Burman Nieves M.D.    On: 06/23/2016 00:45    Microbiology: Recent Results (from the past 240 hour(s))  Blood culture (routine x 2)     Status: None (Preliminary result)   Collection Time: 06/22/16  9:10 PM  Result Value Ref Range Status   Specimen Description BLOOD LEFT HAND  Final   Special Requests IN PEDIATRIC BOTTLE Blood Culture adequate volume  Final   Culture NO GROWTH 4 DAYS  Final   Report Status PENDING  Incomplete  Blood culture (routine x 2)     Status: None (Preliminary result)   Collection Time: 06/22/16  9:33 PM  Result Value Ref Range Status   Specimen Description BLOOD RIGHT HAND  Final   Special Requests   Final    BOTTLES DRAWN AEROBIC ONLY Blood Culture adequate volume   Culture NO GROWTH 4 DAYS  Final   Report Status PENDING  Incomplete  MRSA PCR Screening     Status: None   Collection Time: 06/23/16  6:23 AM  Result Value Ref Range Status   MRSA by PCR NEGATIVE NEGATIVE Final    Comment:        The GeneXpert MRSA Assay (FDA approved for NASAL specimens only), is one component of a comprehensive MRSA colonization surveillance program. It is not intended to diagnose MRSA infection nor to guide or monitor treatment for MRSA infections.      Labs:  Basic Metabolic Panel:  Recent Labs Lab 06/24/16 2056 06/25/16 0014 06/25/16 0459 06/26/16 0752 06/26/16 0957 06/26/16 1600 06/26/16 2346  NA 130*  --  132* 135  --  133* 137  K 3.3*  --  2.5* 2.5*  --  3.2* 3.5  CL 96*  --  92* 94*  --  95* 98*  CO2 22  --  24 27  --  26 28  GLUCOSE 426* 382* 329* 284*  --  367* 235*  BUN 6  --  <5* <5*  --  <5* <5*  CREATININE 0.95  --  0.91 0.72  --  0.94 0.88  CALCIUM 9.5  --  9.6 9.2  --  9.6 9.7  MG  --   --   --   --  2.3  --   --    Liver Function Tests:  Recent Labs Lab 06/22/16 1822  AST 57*  ALT 34  ALKPHOS 97  BILITOT 3.2*  PROT 8.3*  ALBUMIN 4.5    Recent Labs Lab 06/22/16 1822  LIPASE 110*   No results for input(s): AMMONIA in the last 168  hours. CBC:  Recent Labs Lab 06/22/16 1822 06/23/16 1219  WBC 25.0* 12.1*  HGB 17.1* 14.5  HCT 47.7 40.7  MCV 81.4 78.7  PLT 342 206   Cardiac Enzymes: No results for input(s): CKTOTAL, CKMB, CKMBINDEX, TROPONINI in the last 168 hours. BNP: BNP (last 3 results) No results for input(s): BNP in the last 8760 hours.  ProBNP (last 3 results) No results for input(s): PROBNP in the last 8760 hours.  CBG:  Recent Labs Lab 06/26/16 0806 06/26/16 1137 06/26/16 1746 06/26/16 2212 06/27/16 0756  GLUCAP 292* 353* 304* 231* 259*       SignedZannie Cove MD.  [facility] 06/27/2016, 9:44 AM

## 2016-06-28 ENCOUNTER — Ambulatory Visit (INDEPENDENT_AMBULATORY_CARE_PROVIDER_SITE_OTHER): Payer: Self-pay | Admitting: Family Medicine

## 2016-06-28 ENCOUNTER — Encounter: Payer: Self-pay | Admitting: Family Medicine

## 2016-06-28 VITALS — BP 132/70 | HR 114 | Temp 97.8°F | Ht 72.0 in | Wt 366.1 lb

## 2016-06-28 DIAGNOSIS — Z09 Encounter for follow-up examination after completed treatment for conditions other than malignant neoplasm: Secondary | ICD-10-CM

## 2016-06-28 DIAGNOSIS — E1169 Type 2 diabetes mellitus with other specified complication: Secondary | ICD-10-CM

## 2016-06-28 DIAGNOSIS — E669 Obesity, unspecified: Secondary | ICD-10-CM

## 2016-06-28 MED ORDER — METFORMIN HCL 1000 MG PO TABS: 1000.0000 mg | ORAL_TABLET | Freq: Two times a day (BID) | ORAL | 3 refills | Status: DC

## 2016-06-28 MED ORDER — GLIPIZIDE 5 MG PO TABS: 5.0000 mg | ORAL_TABLET | Freq: Two times a day (BID) | ORAL | 3 refills | Status: DC

## 2016-06-28 MED ORDER — AMLODIPINE BESYLATE 5 MG PO TABS: 5.0000 mg | ORAL_TABLET | Freq: Every day | ORAL | 3 refills | Status: DC

## 2016-06-28 NOTE — Patient Instructions (Addendum)
Metformin-take 2 tabs twice daily until you run out. A new rx is at [facility] ($4 list).  Stop Chlorthalidone.  Don't take glipizide (Glucotrol) and your insulin at the same time.  Keep checking your sugars daily for now.

## 2016-06-28 NOTE — Progress Notes (Signed)
Pre visit review using our clinic review tool, if applicable. No additional management support is needed unless otherwise documented below in the visit note. 

## 2016-06-28 NOTE — Progress Notes (Addendum)
Chief Complaint  Patient presents with  . Establish Care    HPI Luis Boyer is a 26 y.o. y.o. male who presents for a transition of care visit.  Pt was discharged from [facility] on 06/27/16.  He is presenting to the office within 48 hours of discharge so no phone call was made.   Pt was admitted for DKA. He was in the ICU on a drip and copious rehydration. He feels much better since being discharged. He does not currently have insurance. He was placed on potassium solution and started on 70/30 Novolog. He was unable to afford the refill on the insulin ($700) and the K ($500). Sugar was 197 this AM. Still taking 500 mg BID Metformin, tolerating well. Diet is improving. He does not exercise.  Social History   Social History  . Marital status: Single   Occupational History  . unemployed    Social History Main Topics  . Smoking status: Never Smoker  . Smokeless tobacco: Never Used  . Alcohol use No  . Drug use: No  . Sexual activity: No   Social History Narrative   Exercise-- bike   Past Medical History:  Diagnosis Date  . ADD (attention deficit disorder)   . Asthma   . Bipolar 1 disorder (HCC)   . Depression   . Diabetes mellitus type 2 in obese (HCC) 08/02/2016  . Diabetes mellitus without complication (HCC)   . Hypertension   . Obesity    Family History  Problem Relation Age of Onset  . Cervical cancer Mother   . Diabetes Father   . Stroke Father   . Hypertension Father   . Hyperlipidemia Father   . Hyperlipidemia Brother   . Hypertension Brother   . Breast cancer Paternal Aunt   . Breast cancer Paternal Grandmother   . Colon cancer Neg Hx    Allergies as of 06/28/2016      Reactions   Celebrex [celecoxib] Shortness Of Breath   Ace Inhibitors Other (See Comments)   angioedema      Medication List       Accurate as of 06/28/16 11:59 PM. Always use your most recent med list.          amLODipine 5 MG tablet Commonly known as:  NORVASC Take 1  tablet (5 mg total) by mouth daily.   glipiZIDE 5 MG tablet Commonly known as:  GLUCOTROL Take 1 tablet (5 mg total) by mouth 2 (two) times daily before a meal.   ibuprofen 200 MG tablet Commonly known as:  ADVIL,MOTRIN Take 400 mg by mouth every 6 (six) hours as needed (pain).   insulin aspart protamine- aspart (70-30) 100 UNIT/ML injection Commonly known as:  NOVOLOG MIX 70/30 Inject 0.5 mLs (50 Units total) into the skin 2 (two) times daily with a meal.   metFORMIN 1000 MG tablet Commonly known as:  GLUCOPHAGE Take 1 tablet (1,000 mg total) by mouth 2 (two) times daily with a meal.   multivitamin with minerals Tabs tablet Take 1 tablet by mouth daily.       ROS:  Constitutional: No fevers or chills, no weight loss HEENT: No headaches, hearing loss, or runny nose, no sore throat Heart: No chest pain Lungs: No SOB, no cough Abd: No bowel changes, no pain, no N/V GU: No urinary complaints Neuro: No numbness, tingling or weakness Msk: No joint or muscle pain  Objective BP 132/70 (BP Location: Left Arm, Patient Position: Sitting, Cuff Size: Large)  Pulse (!) 114   Temp 97.8 F (36.6 C) (Oral)   Ht 6' (1.829 m)   Wt (!) 366 lb 2 oz (166.1 kg)   BMI 49.66 kg/m  General Appearance:  awake, alert, oriented, in no acute distress and well developed, well nourished Skin:  there are no suspicious lesions or rashes of concern Head/face:  NCAT Eyes:  EOMI, PERRLA Ears:  canals and TMs NI Nose/Sinuses:  negative Mouth/Throat:  Mucosa moist, no lesions; pharynx without erythema, edema or exudate. Neck:  neck- supple, no mass, non-tender and no jvd Lungs: Clear to auscultation.  No rales, rhonchi, or wheezing. Normal effort, no accessory muscle use. Heart:  Heart sounds are normal.  Regular rate and rhythm without murmur, gallop or rub. No bruits. Abdomen:  Large panniculus, BS+, soft, NT, ND, no masses or organomegaly Musculoskeletal:  No muscle group atrophy or asymmetry,  gait normal Neurologic:  Alert and oriented x 3, gait normal., reflexes normal and symmetric, strength and  sensation grossly normal Psych exam: Nml mood and affect, age appropriate judgment and insight  Hospital discharge follow-up - Plan: glipiZIDE (GLUCOTROL) 5 MG tablet, metFORMIN (GLUCOPHAGE) 1000 MG tablet, amLODipine (NORVASC) 5 MG tablet, Comprehensive metabolic panel, CBC  Diabetes mellitus type 2 in obese [facility])  Discharge summary and medication list have been reviewed/reconciled.  Labs pending at the time of discharge have been reviewed or are still pending at the time of this visit.  Follow-up labs and appointments have been ordered and/or coordinated appropriately.  TRANSITIONAL CARE MANAGEMENT CERTIFICATION:  I certify the following are true:   1. Communication with the patient/care giver was made within 2 business days of discharge.  2. Complexity of Medical decision making is moderate.  3. Face to face visit occurred within 14 days of discharge.   He will not be able to cont Insulin due to cost. Recommended he try to get coverage. Will start Glipizide after insulin runs out. Do not take together for fear of hypoglycemia. Cont checking sugars. Bring this log to next appointment. Will increase dose of Metformin to 1000 mg BID. Stop Chlorthalidone given reports of hypokalemia. Start Norvasc has he had angioedema with ACEi.  Counseled on diet and exercise. His diet his the root cause of this. F/u 1 mo. May need to increase Glipizide at that time. If still uncontrolled, would add Actos for cost reasons. The patient voiced understanding and agreement to the plan.  Jilda Roche [place], DO 08/02/16 8:49 AM

## 2016-06-30 ENCOUNTER — Other Ambulatory Visit (INDEPENDENT_AMBULATORY_CARE_PROVIDER_SITE_OTHER): Payer: Self-pay

## 2016-06-30 DIAGNOSIS — Z09 Encounter for follow-up examination after completed treatment for conditions other than malignant neoplasm: Secondary | ICD-10-CM

## 2016-06-30 LAB — CBC
HCT: 40.9 % (ref 39.0–52.0)
Hemoglobin: 13.3 g/dL (ref 13.0–17.0)
MCHC: 32.6 g/dL (ref 30.0–36.0)
MCV: 86.3 fl (ref 78.0–100.0)
Platelets: 246 10*3/uL (ref 150.0–400.0)
RBC: 4.74 Mil/uL (ref 4.22–5.81)
RDW: 15.8 % — AB (ref 11.5–15.5)
WBC: 9.5 10*3/uL (ref 4.0–10.5)

## 2016-06-30 LAB — COMPREHENSIVE METABOLIC PANEL
ALBUMIN: 3.8 g/dL (ref 3.5–5.2)
ALK PHOS: 64 U/L (ref 39–117)
ALT: 82 U/L — AB (ref 0–53)
AST: 63 U/L — AB (ref 0–37)
BILIRUBIN TOTAL: 0.4 mg/dL (ref 0.2–1.2)
BUN: 8 mg/dL (ref 6–23)
CALCIUM: 9.7 mg/dL (ref 8.4–10.5)
CO2: 26 mEq/L (ref 19–32)
CREATININE: 0.75 mg/dL (ref 0.40–1.50)
Chloride: 99 mEq/L (ref 96–112)
GFR: 133.14 mL/min (ref >60.00)
Glucose, Bld: 158 mg/dL — ABNORMAL HIGH (ref 70–99)
Potassium: 3.4 mEq/L — ABNORMAL LOW (ref 3.5–5.1)
Sodium: 136 mEq/L (ref 135–145)
TOTAL PROTEIN: 6.4 g/dL (ref 6.0–8.3)

## 2016-07-01 ENCOUNTER — Other Ambulatory Visit: Payer: Self-pay | Admitting: Family Medicine

## 2016-07-01 MED ORDER — POTASSIUM CHLORIDE ER 10 MEQ PO TBCR: 10.0000 meq | EXTENDED_RELEASE_TABLET | Freq: Two times a day (BID) | ORAL | 1 refills | Status: DC

## 2016-07-03 ENCOUNTER — Encounter: Payer: Self-pay | Admitting: Family Medicine

## 2016-07-03 LAB — HM DIABETES EYE EXAM

## 2016-07-05 ENCOUNTER — Telehealth: Payer: Self-pay | Admitting: Family Medicine

## 2016-07-05 NOTE — Telephone Encounter (Signed)
Called and spoke with the pt and informed him that per last OV note he is to start taking the Glipizide after he runs out of the insulin.  He stated that the Glipizide was not called in.  Called [facility] and they stated that they do have the Glipizide.  Called pt back and informed him that the prescription is at the pharmacy and they will get it ready for him.  He also asked if the Metformin was also there and informed him that 3 prescriptions was sent in at the same time.  Pt verbalized understanding and agreed.//AB/CMA

## 2016-07-05 NOTE — Telephone Encounter (Signed)
Called and spoke with the pt and he no longer needs the insulin needles.  He has been switched to Glipizide.//AB/CMA

## 2016-07-05 NOTE — Telephone Encounter (Signed)
Caller name: Relationship to patient: Self Can be reached: [phone] Pharmacy: [id number] - [place] ([place]), [place] - [address]  Reason for call: Patient needs to know if he is suppose to take the insulin tablets or do the insulin injections. States if he needs to take the injections, he needs a refill on needles

## 2016-08-02 ENCOUNTER — Encounter: Payer: Self-pay | Admitting: Family Medicine

## 2016-08-02 DIAGNOSIS — E1169 Type 2 diabetes mellitus with other specified complication: Secondary | ICD-10-CM | POA: Insufficient documentation

## 2016-08-02 DIAGNOSIS — E669 Obesity, unspecified: Secondary | ICD-10-CM

## 2016-08-02 HISTORY — DX: Type 2 diabetes mellitus with other specified complication: E11.69

## 2016-08-02 HISTORY — DX: Type 2 diabetes mellitus with other specified complication: E66.9

## 2016-08-09 ENCOUNTER — Encounter: Payer: Self-pay | Admitting: Family Medicine

## 2016-08-09 ENCOUNTER — Ambulatory Visit (INDEPENDENT_AMBULATORY_CARE_PROVIDER_SITE_OTHER): Payer: Self-pay | Admitting: Family Medicine

## 2016-08-09 VITALS — BP 140/80 | HR 90 | Temp 97.8°F | Ht 72.0 in | Wt 352.0 lb

## 2016-08-09 DIAGNOSIS — L089 Local infection of the skin and subcutaneous tissue, unspecified: Secondary | ICD-10-CM

## 2016-08-09 DIAGNOSIS — E876 Hypokalemia: Secondary | ICD-10-CM

## 2016-08-09 DIAGNOSIS — I10 Essential (primary) hypertension: Secondary | ICD-10-CM

## 2016-08-09 DIAGNOSIS — E1169 Type 2 diabetes mellitus with other specified complication: Secondary | ICD-10-CM

## 2016-08-09 DIAGNOSIS — E669 Obesity, unspecified: Secondary | ICD-10-CM

## 2016-08-09 LAB — BASIC METABOLIC PANEL
BUN: 10 mg/dL (ref 6–23)
CHLORIDE: 103 meq/L (ref 96–112)
CO2: 27 mEq/L (ref 19–32)
Calcium: 9.9 mg/dL (ref 8.4–10.5)
Creatinine, Ser: 0.8 mg/dL (ref 0.40–1.50)
GFR: 123.48 mL/min (ref >60.00)
Glucose, Bld: 106 mg/dL — ABNORMAL HIGH (ref 70–99)
POTASSIUM: 4.1 meq/L (ref 3.5–5.1)
Sodium: 138 mEq/L (ref 135–145)

## 2016-08-09 MED ORDER — CEPHALEXIN 500 MG PO CAPS: 500.0000 mg | ORAL_CAPSULE | Freq: Three times a day (TID) | ORAL | 0 refills | Status: DC

## 2016-08-09 MED ORDER — AMLODIPINE BESYLATE 5 MG PO TABS: 5.0000 mg | ORAL_TABLET | Freq: Every day | ORAL | 3 refills | Status: DC

## 2016-08-09 NOTE — Patient Instructions (Addendum)
Claritin (loratadine), Allegra (fexofenadine), Zyrtec (cetirizine); these are listed in order from weakest to strongest. Generic, and therefore cheaper, options are in the parentheses.   There are available OTC, and the generic versions, which may be cheaper, are in parentheses. Show this to a pharmacist if you have trouble finding any of these items.  Pick up the Norvasc.  Take 1 tab of glyburide daily.

## 2016-08-09 NOTE — Progress Notes (Signed)
Subjective:   Chief Complaint  Patient presents with  . Follow-up    5 weeks on DM    Luis Boyer is a 26 y.o. male here for follow-up of diabetes.   Luis Boyer's self monitored glucose range is low 100's.  Patient had 1 hypoglycemic reaction 2 weeks ago, BS in 60's. He checks his glucose levels 3-4 times per week. Patient does not require insulin.   Medications include: Glyburide 5 mg BID and Metformin 1000 mg BID. He does not take an aspirin daily. Statin? Yes ACEi/ARB? No- angioedema with ACEi  Hypertension Patient presents for hypertension follow up. He does monitor home blood pressures. Blood pressures ranging on average from 130-140's/80's. He is not currently taking any medications. He is starting to adhere to a healthy diet overall. Exercise: some walking  His bellybutton has been draining for the past 2 days. This happened in the hospital, he was placed on abx and it resolved. No redness, pain, fevers, or foul odor. No allergies to meds.   Past Medical History:  Diagnosis Date  . ADD (attention deficit disorder)   . Asthma   . Bipolar 1 disorder (HCC)   . Depression   . Diabetes mellitus type 2 in obese (HCC) 08/02/2016  . Diabetes mellitus without complication (HCC)   . Hypertension   . Obesity     Past Surgical History:  Procedure Laterality Date  . NO PAST SURGERIES      Social History   Social History  . Marital status: Single   Occupational History  . unemployed    Social History Main Topics  . Smoking status: Never Smoker  . Smokeless tobacco: Never Used  . Alcohol use No  . Drug use: No  . Sexual activity: No   Social History Narrative   Exercise-- bike    Current Outpatient Prescriptions on File Prior to Visit  Medication Sig Dispense Refill  . glipiZIDE (GLUCOTROL) 5 MG tablet Take 1 tablet (5 mg total) by mouth 2 (two) times daily before a meal. 60 tablet 3  . ibuprofen (ADVIL,MOTRIN) 200 MG tablet Take 400 mg by mouth every 6 (six)  hours as needed (pain).    . metFORMIN (GLUCOPHAGE) 1000 MG tablet Take 1 tablet (1,000 mg total) by mouth 2 (two) times daily with a meal. 60 tablet 3  . Multiple Vitamin (MULTIVITAMIN WITH MINERALS) TABS tablet Take 1 tablet by mouth daily.    . potassium chloride (KLOR-CON 10) 10 MEQ tablet Take 1 tablet (10 mEq total) by mouth 2 (two) times daily. 60 tablet 1  . amLODipine (NORVASC) 5 MG tablet Take 1 tablet (5 mg total) by mouth daily. (Patient not taking: Reported on 08/09/2016) 30 tablet 3    Related testing: Date of retinal exam: 07/03/16  Done by:  Dr. Aletta EdouardMy Le, Optometrist Pneumovax: done  Review of Systems: Pulmonary:  No SOB Cardiovascular:  No chest pain, no palpitations Skin/Integumentary ROS:  No abnormal skin lesions reported Neurologic:  No numbness, tingling  Objective:  BP 140/80 (BP Location: Right Arm, Patient Position: Sitting, Cuff Size: Large)   Pulse 90   Temp 97.8 F (36.6 C) (Oral)   Ht 6' (1.829 m)   Wt (!) 352 lb (159.7 kg)   SpO2 99%   BMI 47.74 kg/m  General:  Well developed, well nourished, in no apparent distress Skin:  Warm, no pallor or diaphoresis Head:  Normocephalic, atraumatic Eyes:  Pupils equal and round, sclera anicteric without injection  Nose:  External nares  without trauma, no discharge Throat/Pharynx:  Lips and gingiva without lesion Neck: Neck supple.  No obvious thyromegaly or masses.  No bruits Lungs:  clear to auscultation, breath sounds equal bilaterally, no wheezes, rales, or stridor Cardio:  regular rate and rhythm without murmurs, no bruits, no LE edema Abdomen:  Abdomen soft, non-tender, BS normal Skin: Umbilical region with thick and whitish discharge, no foul odor, erythema, fluctuance, erythema or TTP. Psych: Age appropriate judgment and insight  Assessment:   Diabetes mellitus type 2 in obese (HCC) - Plan: glipiZIDE (GLUCOTROL) 5 MG tablet  Essential hypertension - Plan: amLODipine (NORVASC) 5 MG tablet  Hypokalemia -  Plan: Basic metabolic panel  Skin infection - Plan: cephALEXin (KEFLEX) 500 MG capsule   Plan:   Orders as above. Cont Metformin, decrease glyburide to once daily given lower sugars and episode of hypoglycemia. Start Norvasc. May try to add betablocker vs HCTZ (for cost reasons and failure of chlorthalidone) if still not controlled. Given hx of resolution with Keflex (according to hosp D/C summary, will retreat for this), needs to practice good hygiene. Recheck K, likely won't need supplement any further. F/u in 5 wks, recheck A1c and lipids, likely discuss starting a statin, but cost is a barrier. The patient and his wife voiced understanding and agreement to the plan.  Jilda Roche [place], DO 08/09/16 12:11 PM

## 2016-09-13 ENCOUNTER — Encounter: Payer: Self-pay | Admitting: Family Medicine

## 2016-09-13 ENCOUNTER — Ambulatory Visit (INDEPENDENT_AMBULATORY_CARE_PROVIDER_SITE_OTHER): Payer: Self-pay | Admitting: Family Medicine

## 2016-09-13 VITALS — BP 136/78 | HR 84 | Temp 98.5°F | Ht 72.0 in | Wt 342.8 lb

## 2016-09-13 DIAGNOSIS — E669 Obesity, unspecified: Secondary | ICD-10-CM

## 2016-09-13 DIAGNOSIS — I10 Essential (primary) hypertension: Secondary | ICD-10-CM

## 2016-09-13 DIAGNOSIS — E1169 Type 2 diabetes mellitus with other specified complication: Secondary | ICD-10-CM

## 2016-09-13 LAB — HEMOGLOBIN A1C: Hgb A1c MFr Bld: 5.6 % (ref 4.6–6.5)

## 2016-09-13 NOTE — Progress Notes (Signed)
Subjective:   Chief Complaint  Patient presents with  . Follow-up    5 weeks on DM    Luis Boyer is a 26 y.o. male here for follow-up of diabetes.   Deyvi's self monitored glucose range is  Patient denies hypoglycemic reactions. He checks his glucose levels 2 times per day. Patient does not require insulin.   Medications include: Metformin 1000 mg BID,  Patient exercises 7 days per week on average.    He does take an aspirin daily. Statin? Yes ACEi/ARB? No  Hypertension Patient presents for hypertension follow up. He does monitor home blood pressures. Blood pressures ranging on average from 130's/80's. He is compliant with medications. Patient has these side effects of medication: none He is adhering to a healthy diet overall. Exercise: walking dogs   Past Medical History:  Diagnosis Date  . ADD (attention deficit disorder)   . Asthma   . Bipolar 1 disorder (HCC)   . Depression   . Diabetes mellitus type 2 in obese (HCC) 08/02/2016  . Diabetes mellitus without complication (HCC)   . Hypertension   . Obesity     Past Surgical History:  Procedure Laterality Date  . NO PAST SURGERIES      Social History   Social History  . Marital status: Single    Spouse name: N/A  . Number of children: N/A  . Years of education: N/A   Occupational History  . unemployed    Social History Main Topics  . Smoking status: Never Smoker  . Smokeless tobacco: Never Used  . Alcohol use No  . Drug use: No  . Sexual activity: No   Other Topics Concern  . Not on file   Social History Narrative   Exercise-- bike    Current Outpatient Prescriptions on File Prior to Visit  Medication Sig Dispense Refill  . amLODipine (NORVASC) 5 MG tablet Take 1 tablet (5 mg total) by mouth daily. 30 tablet 3  . glipiZIDE (GLUCOTROL) 5 MG tablet Take 1 tablet (5 mg total) by mouth daily before breakfast. 60 tablet 3  . metFORMIN (GLUCOPHAGE) 1000 MG tablet Take 1 tablet (1,000 mg total) by  mouth 2 (two) times daily with a meal. 60 tablet 3  . Multiple Vitamin (MULTIVITAMIN WITH MINERALS) TABS tablet Take 1 tablet by mouth daily.     No current facility-administered medications on file prior to visit.      Related testing: Foot exam(monofilament and inspection):done Retinal exam:done Date of retinal exam: 07/03/16  Done by:  Dr. Aletta Edouard, optometry Pneumovax: done Flu Shot: done  Review of Systems: Eye:  No recent significant change in vision Pulmonary:  No SOB Cardiovascular:  No chest pain, no palpitations Skin/Integumentary ROS:  No abnormal skin lesions reported Neurologic:  No numbness, tingling  Objective:  BP 136/78 (BP Location: Right Arm, Patient Position: Sitting, Cuff Size: Large)   Pulse 84   Temp 98.5 F (36.9 C) (Oral)   Ht 6' (1.829 m)   Wt (!) 342 lb 12.8 oz (155.5 kg)   SpO2 98%   BMI 46.49 kg/m  General:  Well developed, well nourished, in no apparent distress Skin:  Warm, no pallor or diaphoresis Head:  Normocephalic, atraumatic Eyes:  Pupils equal and round, sclera anicteric without injection  Throat/Pharynx:  Lips and gingiva without lesion Neck: Neck supple.  No obvious thyromegaly or masses.  No bruits Lungs:  clear to auscultation, breath sounds equal bilaterally, no wheezes, rales, or stridor Cardio:  regular  rate and rhythm without murmurs, no bruits, no LE edema Neuro:  Sensation intact to pinprick on feet Psych: Age appropriate judgment and insight  Assessment:   Diabetes mellitus type 2 in obese (HCC) - Plan: Hemoglobin A1c, HM Diabetes Foot Exam   Plan:   Orders as above. No change in medication for now, based on wt loss and sugar readings, I believe he will be much improved from 3 mo ago. Counseled on diet and exercise. Challenged him to lift weights. He has lost 24 lbs over the last 3 mo and has done an excellent job. F/u in 3 mo. The patient voiced understanding and agreement to the plan.  Jilda Rocheicholas Paul [place],  DO 09/13/16 10:47 AM

## 2016-09-13 NOTE — Patient Instructions (Addendum)
Keep up the great work.   I challenge you to lift weights to help with the loss of lean mass (muscle).  Let us know if you need anything.

## 2016-09-15 ENCOUNTER — Encounter: Payer: Self-pay | Admitting: Family Medicine

## 2016-10-01 ENCOUNTER — Encounter: Payer: Self-pay | Admitting: Family Medicine

## 2016-10-01 ENCOUNTER — Emergency Department (HOSPITAL_COMMUNITY): Payer: Self-pay

## 2016-10-01 ENCOUNTER — Emergency Department (HOSPITAL_COMMUNITY)
Admission: EM | Admit: 2016-10-01 | Discharge: 2016-10-01 | Disposition: A | Discharge status: Home / Self Care | Payer: Self-pay | Attending: Emergency Medicine | Admitting: Emergency Medicine

## 2016-10-01 ENCOUNTER — Encounter (HOSPITAL_COMMUNITY): Payer: Self-pay | Admitting: Emergency Medicine

## 2016-10-01 ENCOUNTER — Other Ambulatory Visit (HOSPITAL_COMMUNITY): Payer: Self-pay

## 2016-10-01 DIAGNOSIS — R05 Cough: Secondary | ICD-10-CM | POA: Insufficient documentation

## 2016-10-01 DIAGNOSIS — R059 Cough, unspecified: Secondary | ICD-10-CM

## 2016-10-01 DIAGNOSIS — Z79899 Other long term (current) drug therapy: Secondary | ICD-10-CM | POA: Insufficient documentation

## 2016-10-01 DIAGNOSIS — R509 Fever, unspecified: Secondary | ICD-10-CM | POA: Insufficient documentation

## 2016-10-01 DIAGNOSIS — E119 Type 2 diabetes mellitus without complications: Secondary | ICD-10-CM | POA: Insufficient documentation

## 2016-10-01 DIAGNOSIS — I10 Essential (primary) hypertension: Secondary | ICD-10-CM | POA: Insufficient documentation

## 2016-10-01 DIAGNOSIS — R079 Chest pain, unspecified: Secondary | ICD-10-CM | POA: Insufficient documentation

## 2016-10-01 DIAGNOSIS — Z7984 Long term (current) use of oral hypoglycemic drugs: Secondary | ICD-10-CM | POA: Insufficient documentation

## 2016-10-01 DIAGNOSIS — J45909 Unspecified asthma, uncomplicated: Secondary | ICD-10-CM | POA: Insufficient documentation

## 2016-10-01 LAB — CBC WITH DIFFERENTIAL/PLATELET
Basophils Absolute: 0 10*3/uL (ref 0.0–0.1)
Basophils Relative: 0 %
EOS ABS: 0.1 10*3/uL (ref 0.0–0.7)
Eosinophils Relative: 1 %
HEMATOCRIT: 40.3 % (ref 39.0–52.0)
HEMOGLOBIN: 13.5 g/dL (ref 13.0–17.0)
LYMPHS ABS: 1.9 10*3/uL (ref 0.7–4.0)
Lymphocytes Relative: 17 %
MCH: 27.5 pg (ref 26.0–34.0)
MCHC: 33.5 g/dL (ref 30.0–36.0)
MCV: 82.1 fL (ref 78.0–100.0)
MONOS PCT: 10 %
Monocytes Absolute: 1.1 10*3/uL — ABNORMAL HIGH (ref 0.1–1.0)
NEUTROS ABS: 8.3 10*3/uL — AB (ref 1.7–7.7)
NEUTROS PCT: 72 %
Platelets: 265 10*3/uL (ref 150–400)
RBC: 4.91 MIL/uL (ref 4.22–5.81)
RDW: 14 % (ref 11.5–15.5)
WBC: 11.3 10*3/uL — AB (ref 4.0–10.5)

## 2016-10-01 LAB — URINALYSIS, ROUTINE W REFLEX MICROSCOPIC
Bilirubin Urine: NEGATIVE
Glucose, UA: NEGATIVE mg/dL
Hgb urine dipstick: NEGATIVE
KETONES UR: 20 mg/dL — AB
LEUKOCYTES UA: NEGATIVE
NITRITE: NEGATIVE
PH: 5 (ref 5.0–8.0)
PROTEIN: NEGATIVE mg/dL
Specific Gravity, Urine: 1.019 (ref 1.005–1.030)

## 2016-10-01 LAB — PROTIME-INR
INR: 1.04
PROTHROMBIN TIME: 13.6 s (ref 11.4–15.2)

## 2016-10-01 LAB — COMPREHENSIVE METABOLIC PANEL
ALT: 36 U/L (ref 17–63)
ANION GAP: 11 (ref 5–15)
AST: 26 U/L (ref 15–41)
Albumin: 4.3 g/dL (ref 3.5–5.0)
Alkaline Phosphatase: 76 U/L (ref 38–126)
BUN: 9 mg/dL (ref 6–20)
CHLORIDE: 103 mmol/L (ref 101–111)
CO2: 23 mmol/L (ref 22–32)
Calcium: 9.5 mg/dL (ref 8.9–10.3)
Creatinine, Ser: 0.9 mg/dL (ref 0.61–1.24)
GFR calc non Af Amer: >60 mL/min (ref >60)
GLUCOSE: 116 mg/dL — AB (ref 65–99)
POTASSIUM: 4.1 mmol/L (ref 3.5–5.1)
Sodium: 137 mmol/L (ref 135–145)
Total Bilirubin: 0.7 mg/dL (ref 0.3–1.2)
Total Protein: 7.8 g/dL (ref 6.5–8.1)

## 2016-10-01 LAB — TROPONIN I

## 2016-10-01 LAB — I-STAT CG4 LACTIC ACID, ED: Lactic Acid, Venous: 1.17 mmol/L (ref 0.5–1.9)

## 2016-10-01 MED ORDER — SODIUM CHLORIDE 0.9 % IV BOLUS (SEPSIS)
1000.0000 mL | Freq: Once | INTRAVENOUS | Status: AC
  Administered 2016-10-01: 1000 mL via INTRAVENOUS

## 2016-10-01 MED ORDER — IBUPROFEN 800 MG PO TABS: 800.0000 mg | ORAL_TABLET | Freq: Three times a day (TID) | ORAL | 0 refills | Status: DC

## 2016-10-01 MED ORDER — ACETAMINOPHEN 325 MG PO TABS
ORAL_TABLET | ORAL | Status: AC
  Filled 2016-10-01: qty 2

## 2016-10-01 MED ORDER — ALBUTEROL SULFATE HFA 108 (90 BASE) MCG/ACT IN AERS: 1.0000 | INHALATION_SPRAY | Freq: Four times a day (QID) | RESPIRATORY_TRACT | 0 refills | Status: DC | PRN

## 2016-10-01 MED ORDER — BENZONATATE 100 MG PO CAPS: 100.0000 mg | ORAL_CAPSULE | Freq: Three times a day (TID) | ORAL | 0 refills | Status: DC

## 2016-10-01 MED ORDER — ACETAMINOPHEN 325 MG PO TABS
650.0000 mg | ORAL_TABLET | Freq: Once | ORAL | Status: AC
  Administered 2016-10-01: 650 mg via ORAL

## 2016-10-01 NOTE — ED Triage Notes (Signed)
CP/SHOB onset 2-3 days ago. Reports cough. Pain worse with coughing. Febrile and tachycardic in triage

## 2016-10-01 NOTE — ED Provider Notes (Signed)
[facility]-EMERGENCY DEPT Provider Note   CSN: [id number] Arrival date & time: 10/01/16  0532     History   Chief Complaint Chief Complaint  Patient presents with  . Chest Pain  . Shortness of Breath    HPI Luis Boyer is a 26 y.o. male.  The history is provided by the patient and medical records.  Chest Pain   Associated symptoms include shortness of breath.  Shortness of Breath  Associated symptoms include chest pain.    26 year old male with history of ADD, asthma, bipolar disorder, depression, diabetes, hypertension, obesity, presenting to the ED with chest pain and shortness of breath.  Patient states for the past 3 days he has been having nonproductive cough and low-grade fever with chills. States when he coughs he has some discomfort in the right side of his chest and his back.  Denies exertional or positional changes in pain. He has not had any nausea or vomiting. States his wife has been sick with some URI symptoms as well, but hers has not been as severe. He does have history of asthma, not currently on any medications. He is not a smoker. Denies any illicit drug use. No hx of DVT or PE.  No recent travel or prolonged immobilization.  No surgeries.  No leg swelling or calf pain.  He has not tried any medications for his symptoms at home.  Wife reports patient's health seems to have taken a turn downhill somewhat after he was hospitalized for DKA a few months ago.    Past Medical History:  Diagnosis Date  . ADD (attention deficit disorder)   . Asthma   . Bipolar 1 disorder (HCC)   . Depression   . Diabetes mellitus type 2 in obese (HCC) 08/02/2016  . Diabetes mellitus without complication (HCC)   . Hypertension   . Obesity     Patient Active Problem List   Diagnosis Date Noted  . Diabetes mellitus type 2 in obese (HCC) 08/02/2016  . DKA (diabetic ketoacidoses) (HCC) 06/23/2016  . Cellulitis of abdominal wall 06/23/2016  . Enlarged lymph nodes 04/19/2014  . Rectal  bleed 12/20/2011  . Constipation due to slow transit 12/20/2011  . Angioedema 12/20/2011  . BIPOLAR DISORDER UNSPECIFIED 01/17/2007  . ANGER 11/29/2006  . ADD 11/29/2006  . Essential hypertension 06/08/2006  . ASTHMA 06/08/2006    Past Surgical History:  Procedure Laterality Date  . NO PAST SURGERIES         Home Medications    Prior to Admission medications   Medication Sig Start Date End Date Taking? Authorizing Provider  amLODipine (NORVASC) 5 MG tablet Take 1 tablet (5 mg total) by mouth daily. 08/09/16   Sharlene DoryWendling, Nicholas Paul, DO  glipiZIDE (GLUCOTROL) 5 MG tablet Take 1 tablet (5 mg total) by mouth daily before breakfast. 08/09/16   Carmelia RollerWendling, Jilda RocheNicholas Paul, DO  metFORMIN (GLUCOPHAGE) 1000 MG tablet Take 1 tablet (1,000 mg total) by mouth 2 (two) times daily with a meal. 06/28/16   Wendling, Jilda RocheNicholas Paul, DO  Multiple Vitamin (MULTIVITAMIN WITH MINERALS) TABS tablet Take 1 tablet by mouth daily.    [provider]    Family History Family History  Problem Relation Age of Onset  . Cervical cancer Mother   . Diabetes Father   . Stroke Father   . Hypertension Father   . Hyperlipidemia Father   . Hyperlipidemia Brother   . Hypertension Brother   . Breast cancer Paternal Aunt   . Breast cancer Paternal Grandmother   .  Colon cancer Neg Hx     Social History Social History  Substance Use Topics  . Smoking status: Never Smoker  . Smokeless tobacco: Never Used  . Alcohol use No     Allergies   Celebrex [celecoxib] and Ace inhibitors   Review of Systems Review of Systems  Respiratory: Positive for shortness of breath.   Cardiovascular: Positive for chest pain.  All other systems reviewed and are negative.    Physical Exam Updated Vital Signs BP (!) 158/97   Pulse (!) 127   Temp (!) 100.7 F (38.2 C) (Oral)   Resp 19   Ht 6' (1.829 m)   Wt (!) 159.7 kg (352 lb)   SpO2 98%   BMI 47.74 kg/m   Physical Exam  Constitutional: He is oriented to  person, place, and time. He appears well-developed and well-nourished.  Overall well appearing, no distress, nontoxic in appearance Morbidly obese  HENT:  Head: Normocephalic and atraumatic.  Mouth/Throat: Oropharynx is clear and moist.  Eyes: Pupils are equal, round, and reactive to light. Conjunctivae and EOM are normal.  Neck: Normal range of motion.  Cardiovascular: Normal rate, regular rhythm and normal heart sounds.  Exam reveals no friction rub.   No murmur heard. Pulmonary/Chest: Effort normal and breath sounds normal. No respiratory distress. He has no wheezes.  Lungs overall clear, no wheezes or rhonchi, able speak in full sentences, O2 sat 100% room air  Abdominal: Soft. Bowel sounds are normal.  Musculoskeletal: Normal range of motion.  Neurological: He is alert and oriented to person, place, and time.  Skin: Skin is warm and dry.  Psychiatric: He has a normal mood and affect.  Nursing note and vitals reviewed.    ED Treatments / Results  Labs (all labs ordered are listed, but only abnormal results are displayed) Labs Reviewed  URINALYSIS, ROUTINE W REFLEX MICROSCOPIC - Abnormal; Notable for the following:       Result Value   Ketones, ur 20 (*)    All other components within normal limits  CULTURE, BLOOD (ROUTINE X 2)  CULTURE, BLOOD (ROUTINE X 2)  COMPREHENSIVE METABOLIC PANEL  CBC WITH DIFFERENTIAL/PLATELET  PROTIME-INR  TROPONIN I  I-STAT CG4 LACTIC ACID, ED    EKG  EKG Interpretation None       Radiology Dg Chest Port 1 View  Result Date: 10/01/2016 CLINICAL DATA:  Chest pain and dyspnea for 2 or 3 days. Febrile and tachycardic EXAM: PORTABLE CHEST 1 VIEW COMPARISON:  06/22/2016 FINDINGS: A single AP portable view of the chest demonstrates no focal airspace consolidation or alveolar edema. The lungs are grossly clear. There is no large effusion or pneumothorax. Cardiac and mediastinal contours appear unremarkable. IMPRESSION: No active disease.  Electronically Signed   By: Ellery Plunk M.D.   On: 10/01/2016 06:11    Procedures Procedures (including critical care time)  Medications Ordered in ED Medications  acetaminophen (TYLENOL) 325 MG tablet (not administered)  acetaminophen (TYLENOL) tablet 650 mg (650 mg Oral Given 10/01/16 0545)  sodium chloride 0.9 % bolus 1,000 mL (1,000 mLs Intravenous New Bag/Given 10/01/16 4098)     Initial Impression / Assessment and Plan / ED Course  I have reviewed the triage vital signs and the nursing notes.  Pertinent labs & imaging results that were available during my care of the patient were reviewed by me and considered in my medical decision making (see chart for details).  26 y.o. M here with fever, cough, and chest pain x3 days.  Sepsis pathway started in triage.  Patient overall appears well, non-toxic.  His lungs are overall clear, no distress. O2 sat stable. Does report some chest pain, but it seems this is more related to his coughing. There is not seem to be a positional or exertional component.  No cardiac hx.  No significant risk factors for PE.  No calf pain/swelling.  We'll plan for labs, EKG, chest x-ray. Tylenol IV fluids given.  7:52 AM HR significantly improved With fluids and fever control, still mild tachycardia around 102-110 during exam. Patient states he feeling better. Reports his chest pain is resolving as his heart rate is improving. His O2 sats remain stable.  I reviewed labs and imaging studies with patient and his wife which are reassuring, they acknowledged understanding. Will give additional fluids for better heart rate control. If patient remains stable, anticipate discharge.  10:02 AM Patient's HR now stable in the 80's.  BP remains stable.  Reports chest pain remains controlled at this time. Patient continues to appear well overall. At this time, low suspicion for ACS, PE, dissection, or other acute cardiac event. He did not have any EKG findings suggestive of  pericarditis. He has no history of IV drug use, no rub or murmur on exam to suggest endocarditis.  Will discharge home. He was advised blood cultures were sent he will be contacted if they are abnormal. If so, he will need to return to the ED. Will discharge home with supportive measures including albuterol, cough medicine, Motrin. Recommended close follow-up with PCP.  Discussed plan with patient, he acknowledged understanding and agreed with plan of care.  Return precautions given for new or worsening symptoms.  Final Clinical Impressions(s) / ED Diagnoses   Final diagnoses:  Cough  Fever, unspecified fever cause  Chest pain, unspecified type    New Prescriptions New Prescriptions   No medications on file     Garlon HatchetSanders, Lesha Jager M, PA-C 10/01/16 1015    Garlon HatchetSanders, Yvett Rossel M, PA-C 10/01/16 1015    Gilda CreasePollina, Luis J, MD 10/01/16 [id number]

## 2016-10-01 NOTE — Discharge Instructions (Signed)
As we discussed, labs looked good today.  Chest x-ray was normal. We are unsure why you are having fever, may be viral cause. We sent some blood cultures today, you will be contacted if they are abnormal. Can continue motrin for fever, can take tessalon for cough.  Can use albuterol inhaler if needed. Follow-up with your primary care doctor. You can return here for any new/worsening symptoms.

## 2016-10-01 NOTE — ED Notes (Signed)
Pt ambulatory at departure, voices understanding of discharge instructions. VSS

## 2016-10-06 LAB — CULTURE, BLOOD (ROUTINE X 2)
Culture: NO GROWTH
Culture: NO GROWTH
Special Requests: ADEQUATE
Special Requests: ADEQUATE

## 2016-11-08 ENCOUNTER — Encounter: Payer: Self-pay | Admitting: Family Medicine

## 2016-11-08 ENCOUNTER — Other Ambulatory Visit: Payer: Self-pay | Admitting: Family Medicine

## 2016-11-08 DIAGNOSIS — Z09 Encounter for follow-up examination after completed treatment for conditions other than malignant neoplasm: Secondary | ICD-10-CM

## 2016-11-09 MED ORDER — METFORMIN HCL 1000 MG PO TABS: 1000.0000 mg | ORAL_TABLET | Freq: Two times a day (BID) | ORAL | 3 refills | Status: DC

## 2016-12-10 ENCOUNTER — Other Ambulatory Visit: Payer: Self-pay | Admitting: Family Medicine

## 2016-12-10 DIAGNOSIS — Z09 Encounter for follow-up examination after completed treatment for conditions other than malignant neoplasm: Secondary | ICD-10-CM

## 2016-12-15 ENCOUNTER — Ambulatory Visit (INDEPENDENT_AMBULATORY_CARE_PROVIDER_SITE_OTHER): Payer: PRIVATE HEALTH INSURANCE | Admitting: Family Medicine

## 2016-12-15 ENCOUNTER — Encounter: Payer: Self-pay | Admitting: Family Medicine

## 2016-12-15 VITALS — BP 140/88 | HR 60 | Temp 98.5°F | Ht 72.0 in | Wt 319.2 lb

## 2016-12-15 DIAGNOSIS — E1169 Type 2 diabetes mellitus with other specified complication: Secondary | ICD-10-CM | POA: Diagnosis not present

## 2016-12-15 DIAGNOSIS — E669 Obesity, unspecified: Secondary | ICD-10-CM | POA: Diagnosis not present

## 2016-12-15 DIAGNOSIS — Z23 Encounter for immunization: Secondary | ICD-10-CM | POA: Diagnosis not present

## 2016-12-15 DIAGNOSIS — I10 Essential (primary) hypertension: Secondary | ICD-10-CM

## 2016-12-15 LAB — HEMOGLOBIN A1C: HEMOGLOBIN A1C: 5.4 % (ref 4.6–6.5)

## 2016-12-15 MED ORDER — AMLODIPINE BESYLATE 5 MG PO TABS: 5.0000 mg | ORAL_TABLET | Freq: Every day | ORAL | 1 refills | Status: DC

## 2016-12-15 NOTE — Progress Notes (Signed)
Subjective:   Chief Complaint  Patient presents with  . Follow-up    Luis Boyer is a 26 y.o. male here for follow-up of diabetes.   Luis Boyer's self monitored glucose range is .  Patient denies hypoglycemic reactions. He checks his glucose levels 1 times per day. Patient does not require insulin.   Medications include: Glipizide, Metformin 1000 mg BID Patient walks daily.  Hypertension Patient presents for hypertension follow up. He does not monitor home blood pressures. He is normally compliant with medication- Norvasc 5 mg daily, but has run out. Patient has these side effects of medication: none He is adhering to a healthy diet overall. He has lost 30 pounds since his last visit 3 months ago. Exercise: walking   Past Medical History:  Diagnosis Date  . ADD (attention deficit disorder)   . Asthma   . Bipolar 1 disorder (HCC)   . Depression   . Diabetes mellitus type 2 in obese (HCC) 08/02/2016  . Diabetes mellitus without complication (HCC)   . Hypertension   . Obesity     Past Surgical History:  Procedure Laterality Date  . NO PAST SURGERIES       Related testing: Date of retinal exam: 06/2016  Done by:  Dr. Conley Rolls Pneumovax: done Flu Shot: receiving today  Review of Systems: Pulmonary:  No SOB Cardiovascular:  No chest pain, no palpitations  Objective:  BP 140/88 (BP Location: Left Arm, Patient Position: Sitting, Cuff Size: Large)   Pulse 60   Temp 98.5 F (36.9 C) (Oral)   Ht 6' (1.829 m)   Wt (!) 319 lb 4 oz (144.8 kg)   SpO2 98%   BMI 43.30 kg/m  General:  Well developed, well nourished, in no apparent distress Head:  Normocephalic, atraumatic Eyes:  Pupils equal and round, sclera anicteric without injection  Nose:  External nares without trauma, no discharge Throat/Pharynx:  Lips and gingiva without lesion Neck: Neck supple.  No obvious thyromegaly or masses.  No bruits Lungs:  clear to auscultation, breath sounds equal bilaterally, no wheezes,  rales, or stridor Cardio:  regular rate and rhythm without murmurs, no bruits, no LE edema Psych: Age appropriate judgment and insight  Assessment:   Diabetes mellitus type 2 in obese (HCC) - Plan: metFORMIN (GLUCOPHAGE) 1000 MG tablet, Hemoglobin A1c  Essential hypertension - Plan: amLODipine (NORVASC) 5 MG tablet  Need for influenza vaccination - Plan: Flu Vaccine QUAD 6+ mos PF IM (Fluarix Quad PF)   Plan:   Orders as above. An excellent job with weight loss. Challenged him to lift weights. Yoga. F/u in 3 mo- I would like to check his A1c there and perhaps take him off of Metformin altogether if appropriate.  Restart Norvasc, he is well controlled when taking. Allergic rxn to ACEi, will avoid ARB if possible.  The patient voiced understanding and agreement to the plan.  Jilda Roche [place], DO 12/15/16 11:51 AM

## 2016-12-15 NOTE — Patient Instructions (Signed)
Start lifting weights.  Keep up the great work.  Stop taking Glipizide. Cut your Metformin in half.

## 2016-12-15 NOTE — Progress Notes (Signed)
Pre visit review using our clinic review tool, if applicable. No additional management support is needed unless otherwise documented below in the visit note. 

## 2017-03-08 ENCOUNTER — Encounter: Payer: Self-pay | Admitting: Family Medicine

## 2017-03-08 DIAGNOSIS — E1169 Type 2 diabetes mellitus with other specified complication: Secondary | ICD-10-CM

## 2017-03-08 DIAGNOSIS — E669 Obesity, unspecified: Principal | ICD-10-CM

## 2017-03-09 MED ORDER — METFORMIN HCL 1000 MG PO TABS: 500.0000 mg | ORAL_TABLET | Freq: Two times a day (BID) | ORAL | 3 refills | Status: DC

## 2017-03-16 ENCOUNTER — Encounter: Payer: Self-pay | Admitting: Family Medicine

## 2017-03-16 ENCOUNTER — Other Ambulatory Visit: Payer: Self-pay | Admitting: Family Medicine

## 2017-03-16 ENCOUNTER — Ambulatory Visit: Payer: PRIVATE HEALTH INSURANCE | Admitting: Family Medicine

## 2017-03-16 VITALS — BP 134/82 | HR 93 | Temp 98.2°F | Ht 72.0 in | Wt 313.1 lb

## 2017-03-16 DIAGNOSIS — F32A Depression, unspecified: Secondary | ICD-10-CM | POA: Insufficient documentation

## 2017-03-16 DIAGNOSIS — F329 Major depressive disorder, single episode, unspecified: Secondary | ICD-10-CM

## 2017-03-16 DIAGNOSIS — I10 Essential (primary) hypertension: Secondary | ICD-10-CM | POA: Diagnosis not present

## 2017-03-16 DIAGNOSIS — E669 Obesity, unspecified: Secondary | ICD-10-CM | POA: Diagnosis not present

## 2017-03-16 DIAGNOSIS — E1169 Type 2 diabetes mellitus with other specified complication: Secondary | ICD-10-CM | POA: Diagnosis not present

## 2017-03-16 DIAGNOSIS — F419 Anxiety disorder, unspecified: Secondary | ICD-10-CM | POA: Diagnosis not present

## 2017-03-16 LAB — HEMOGLOBIN A1C: Hgb A1c MFr Bld: 5.5 % (ref 4.6–6.5)

## 2017-03-16 MED ORDER — FLUOXETINE HCL 20 MG PO TABS
20.0000 mg | ORAL_TABLET | Freq: Every day | ORAL | 3 refills | Status: DC
Start: 2017-03-16 — End: 2017-03-17

## 2017-03-16 NOTE — Progress Notes (Signed)
Subjective:   Chief Complaint  Patient presents with  . Follow-up    Luis Boyer is a 27 y.o. male here for follow-up of diabetes.  He is here with his wife. [name]'s self monitored glucose range is low 100's.  Patient denies hypoglycemic reactions. He checks his glucose levels every other week. Patient does not require insulin.   Medications include: Metformin 500 mg BID Patient exercises 7 days per week on average.   He does take an aspirin daily. Statin? Yes ACEi/ARB? Yes  He also brought up over the last 2-3 weeks that has been very irritable, having mood swings, depressed, and unpleasant to be around.  No particular incident started this matter.  His wife also has depression.  No self medication, no thoughts of harming himself or others.  He does not follow with a counselor or psychologist.  He is interested in starting a medicine.  Past Medical History:  Diagnosis Date  . ADD (attention deficit disorder)   . Asthma   . Bipolar 1 disorder (HCC)   . Diabetes mellitus type 2 in obese (HCC) 08/02/2016  . Hypertension   . Obesity     Past Surgical History:  Procedure Laterality Date  . NO PAST SURGERIES      Social History   Socioeconomic History  . Marital status: Single  Occupational History  . Occupation: unemployed  Tobacco Use  . Smoking status: Never Smoker  . Smokeless tobacco: Never Used  Substance and Sexual Activity  . Alcohol use: No  . Drug use: No  . Sexual activity: No    Partners: Female  Other Topics Concern  . Not on file  Social History Narrative   Exercise-- bike    Current Outpatient Medications on File Prior to Visit  Medication Sig Dispense Refill  . amLODipine (NORVASC) 5 MG tablet Take 1 tablet (5 mg total) by mouth daily. 90 tablet 1  . metFORMIN (GLUCOPHAGE) 1000 MG tablet Take 0.5 tablets (500 mg total) by mouth 2 (two) times daily with a meal. 180 tablet 3  . Multiple Vitamin (MULTIVITAMIN WITH MINERALS) TABS tablet Take 1 tablet  by mouth daily.      Related testing: Foot exam(monofilament and inspection):done Retinal exam:done Date of retinal exam: 4/18  Done by:  Dr. Conley Rolls Pneumovax: done Flu Shot: done  Review of Systems: Eye:  No recent significant change in vision Pulmonary:  No SOB Cardiovascular:  No chest pain, no palpitations Skin/Integumentary ROS:  No abnormal skin lesions reported Neurologic:  No numbness, tingling  Objective:  BP 134/82 (BP Location: Left Arm, Patient Position: Sitting, Cuff Size: Large)   Pulse 93   Temp 98.2 F (36.8 C) (Oral)   Ht 6' (1.829 m)   Wt (!) 313 lb 2 oz (142 kg)   SpO2 98%   BMI 42.47 kg/m  General:  Well developed, well nourished, in no apparent distress Skin:  Warm, no pallor or diaphoresis Head:  Normocephalic, atraumatic Eyes:  Pupils equal and round, sclera anicteric without injection  Nose:  External nares without trauma, no discharge Throat/Pharynx:  Lips and gingiva without lesion Neck: Neck supple.  No obvious thyromegaly or masses.  No bruits Lungs:  clear to auscultation, breath sounds equal bilaterally, no wheezes, rales, or stridor Cardio:  regular rate and rhythm without murmurs, no bruits, no LE edema Psych: Age appropriate judgment and insight  Assessment:   Diabetes mellitus type 2 in obese (HCC) - Plan: Hemoglobin A1c  Anxiety and depression  Essential  hypertension  Morbid obesity (HCC)   Plan:   Orders as above. He continues to lose weight.  I think we will be able to take him off of his metformin.  Given his blood pressure, I would like to keep him on the Norvasc for now. Start Prozac.  Information for counseling also given. F/u in 6 weeks. The patient and his wife voiced understanding and agreement to the plan.  Jilda Rocheicholas Paul [place], DO 03/16/17 12:25 PM

## 2017-03-16 NOTE — Patient Instructions (Addendum)
Ok to check sugars as much and as little as you want.   Aim to do some physical exertion for 150 minutes per week. This is typically divided into 5 days per week, 30 minutes per day. The activity should be enough to get your heart rate up. Anything is better than nothing if you have time constraints.  Please consider counseling. Contact [phone] to schedule an appointment or inquire about cost/insurance coverage.  Coping skills Choose 5 that work for you:  Take a deep breath  Count to 20  Read a book  Do a puzzle  Meditate  Bake  Sing  Knit  Garden  Pray  Go outside  Call a friend  Listen to music  Take a walk  Color  Send a note  Take a bath  Watch a movie  Be alone in a quiet place  Pet an animal  Visit a friend  Journal  Exercise  Stretch

## 2017-03-16 NOTE — Progress Notes (Signed)
Pre visit review using our clinic review tool, if applicable. No additional management support is needed unless otherwise documented below in the visit note. 

## 2017-03-16 NOTE — Progress Notes (Signed)
Metformin D/C'd given good control.

## 2017-03-17 MED ORDER — SERTRALINE HCL 50 MG PO TABS: 50.0000 mg | ORAL_TABLET | Freq: Every day | ORAL | 3 refills | Status: DC

## 2017-03-17 NOTE — Telephone Encounter (Signed)
Zoloft called in. Prozac removed from med list.

## 2017-05-04 ENCOUNTER — Ambulatory Visit: Payer: Self-pay | Admitting: Family Medicine

## 2017-05-16 ENCOUNTER — Encounter: Payer: Self-pay | Admitting: Family Medicine

## 2017-05-17 MED ORDER — SERTRALINE HCL 50 MG PO TABS: 50.0000 mg | ORAL_TABLET | Freq: Every day | ORAL | 3 refills | Status: DC

## 2017-06-27 ENCOUNTER — Encounter: Payer: Self-pay | Admitting: Family Medicine

## 2017-06-27 DIAGNOSIS — I10 Essential (primary) hypertension: Secondary | ICD-10-CM

## 2017-06-28 MED ORDER — AMLODIPINE BESYLATE 5 MG PO TABS: 5.0000 mg | ORAL_TABLET | Freq: Every day | ORAL | 0 refills | Status: DC

## 2017-11-22 ENCOUNTER — Encounter: Payer: Self-pay | Admitting: Family Medicine

## 2017-11-22 DIAGNOSIS — I10 Essential (primary) hypertension: Secondary | ICD-10-CM

## 2017-11-23 MED ORDER — AMLODIPINE BESYLATE 5 MG PO TABS: 5.0000 mg | ORAL_TABLET | Freq: Every day | ORAL | 0 refills | Status: DC

## 2018-01-07 IMAGING — DX DG CHEST 2V
2 series · 2 of 2 positions shown · non-contrast
Comparison: Chest radiograph from 07/12/2013

CLINICAL DATA: Acute onset of shortness of breath. Initial
encounter.

EXAM:
CHEST  2 VIEW

[chest pa]
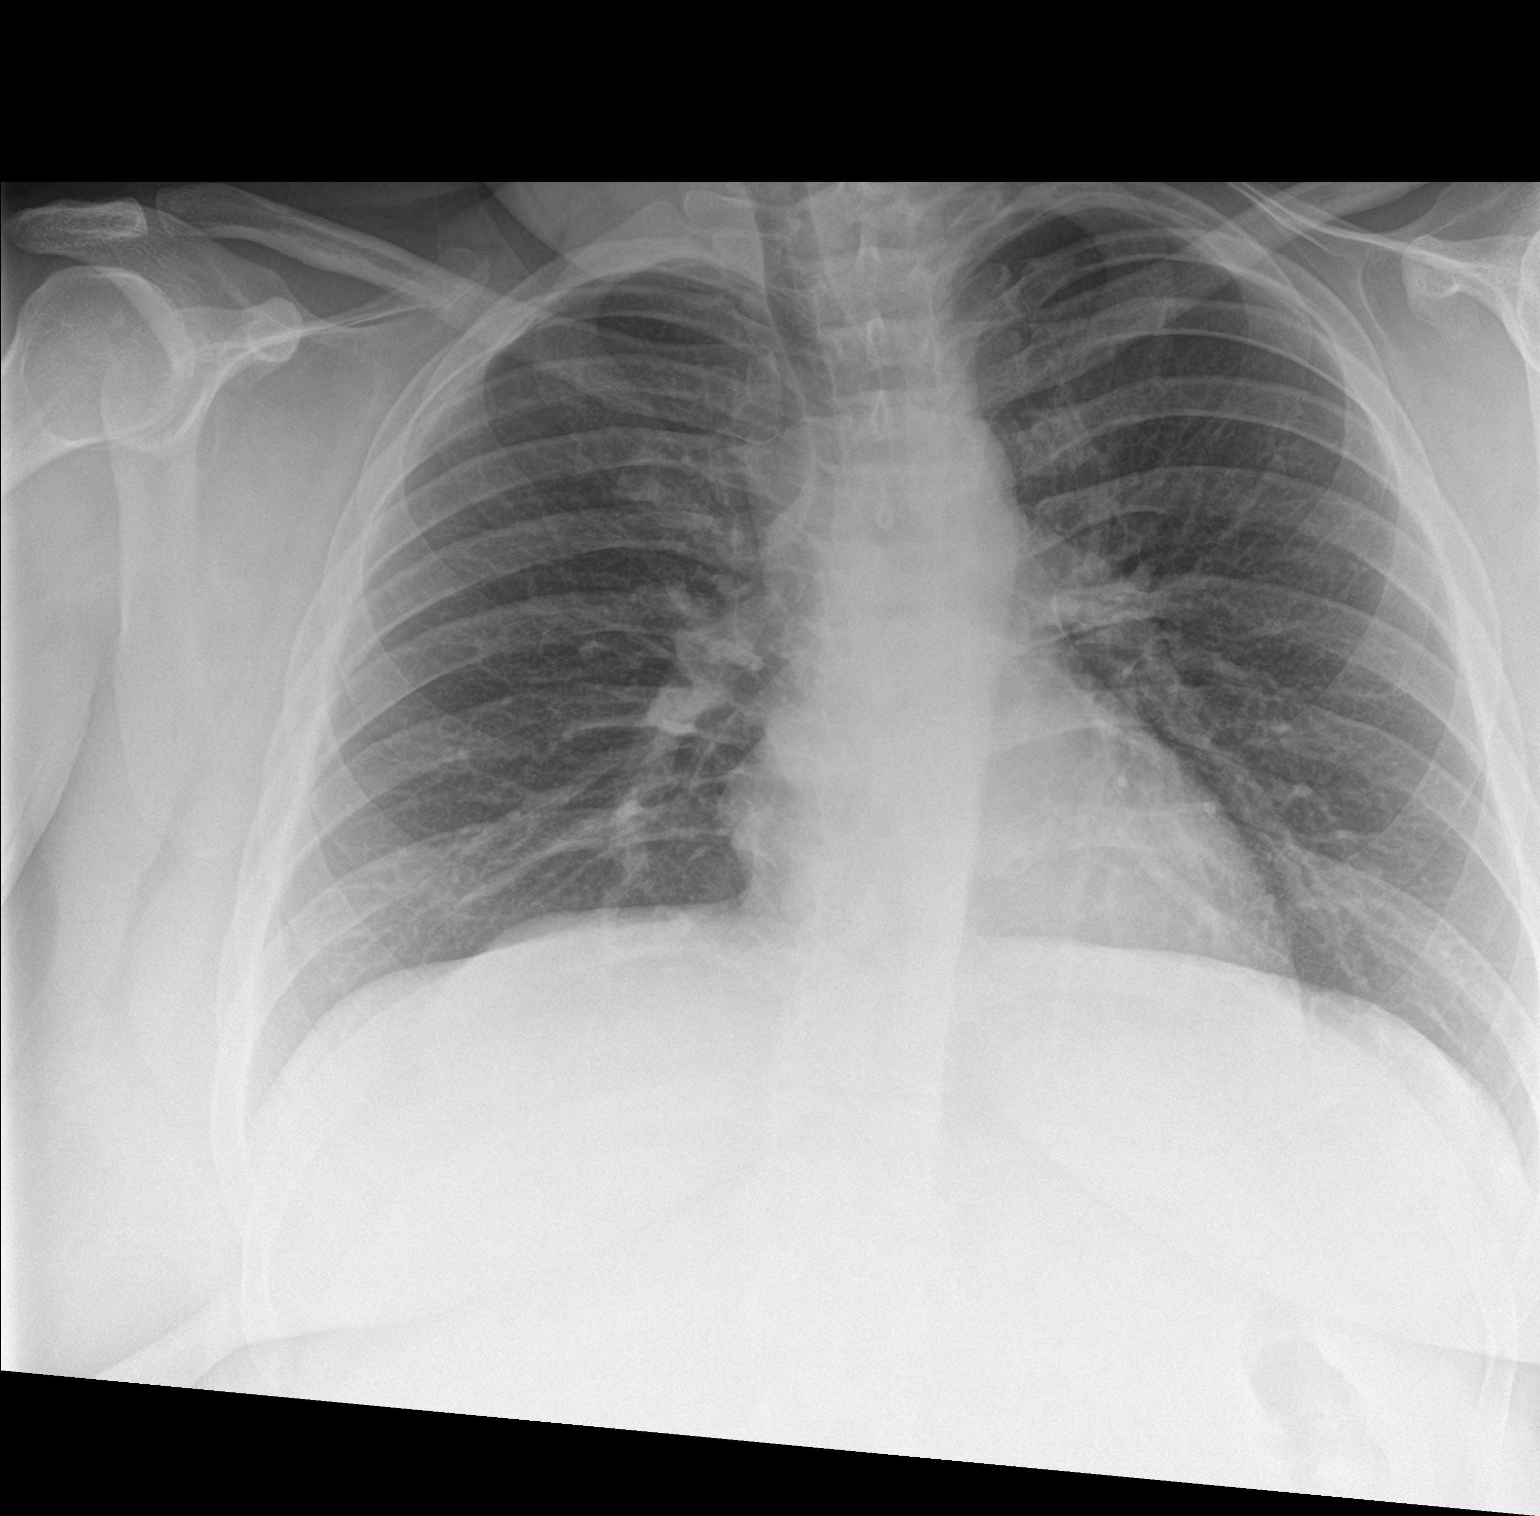

[chest lat]
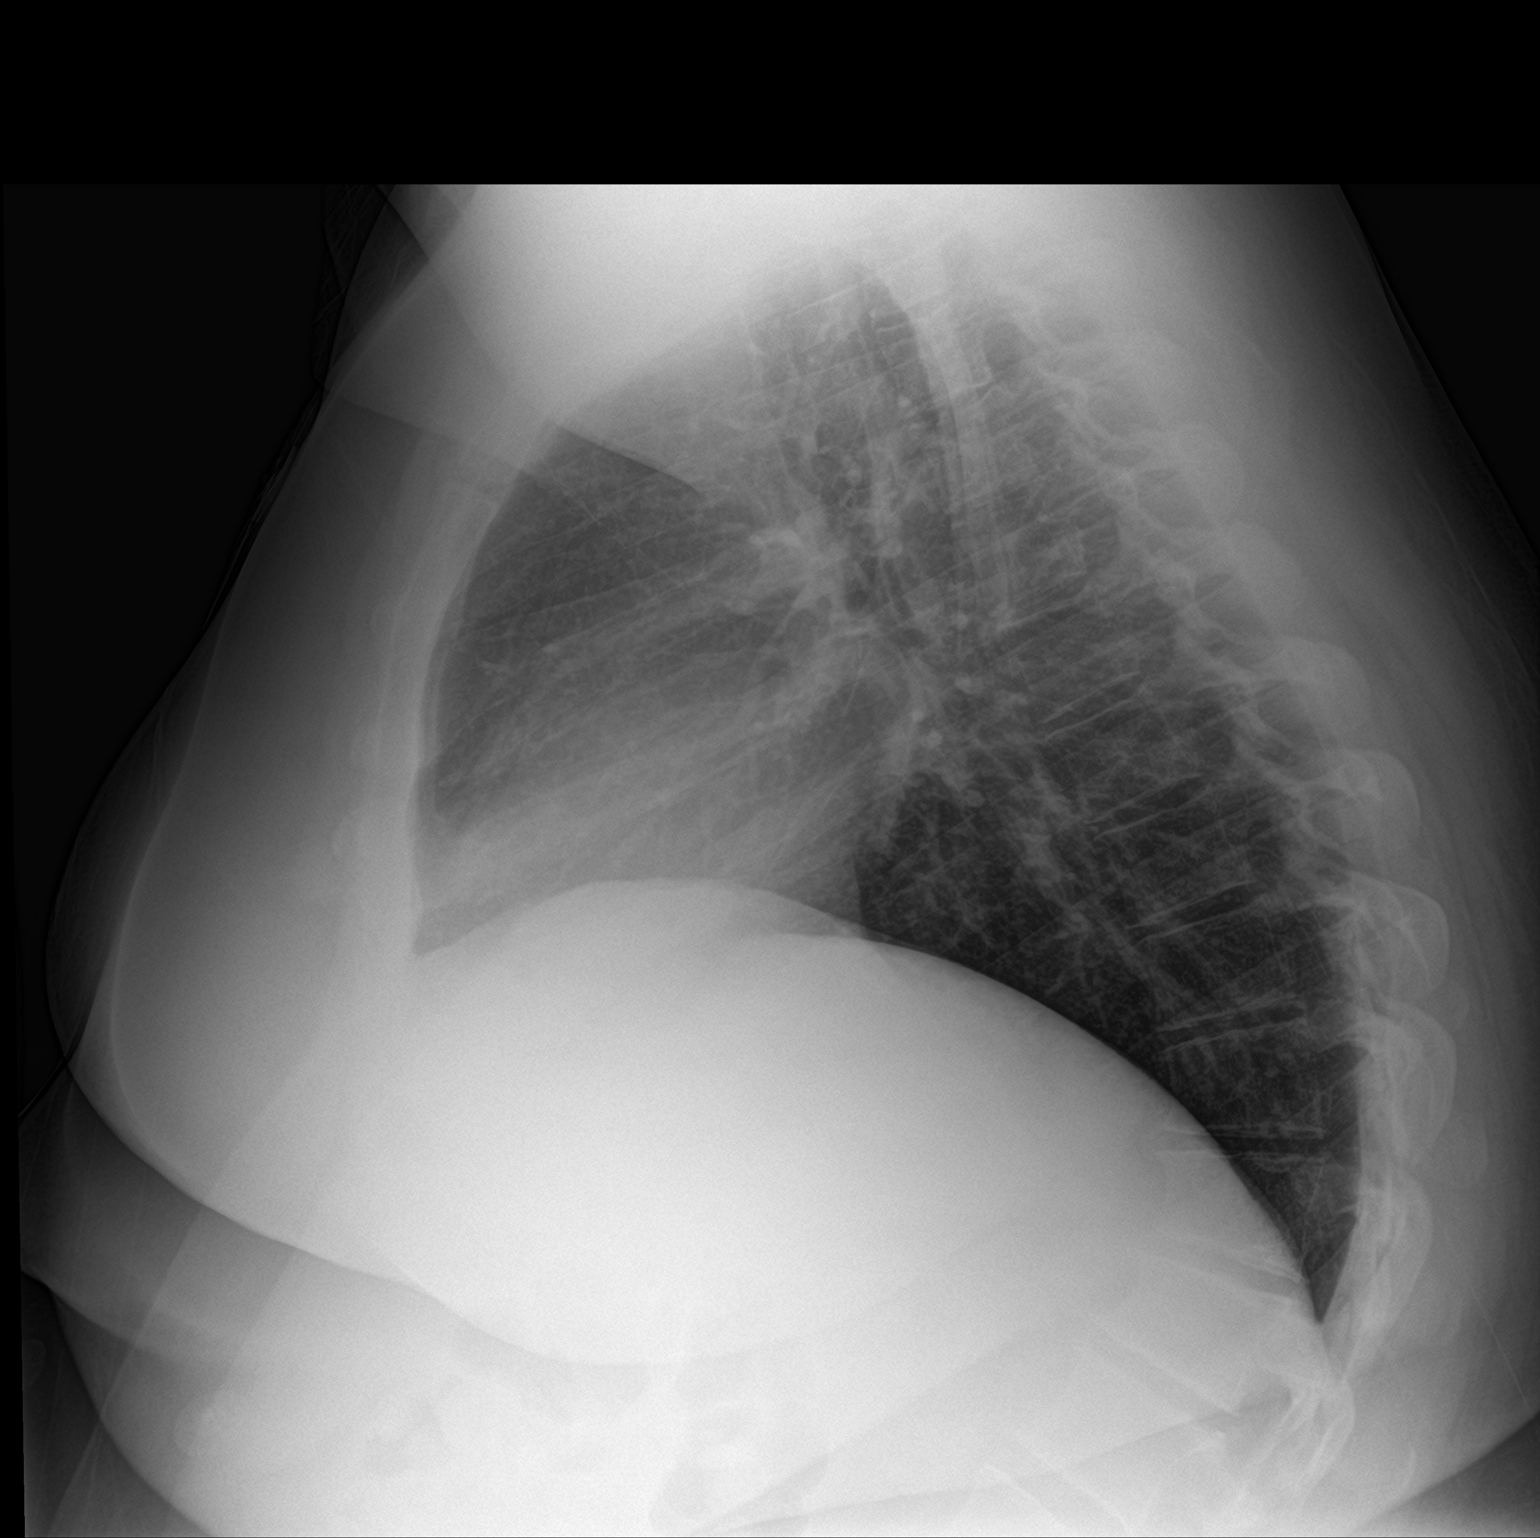

[2 of 2 positions shown; findings below may reference images not displayed]

FINDINGS: The lungs are well-aerated and clear. There is no evidence of focal
opacification, pleural effusion or pneumothorax.

The heart is normal in size; the mediastinal contour is within
normal limits. No acute osseous abnormalities are seen.
IMPRESSION: No acute cardiopulmonary process seen.

## 2018-04-04 ENCOUNTER — Encounter (HOSPITAL_COMMUNITY): Payer: Self-pay

## 2018-04-04 ENCOUNTER — Emergency Department (HOSPITAL_COMMUNITY)
Admission: EM | Admit: 2018-04-04 | Discharge: 2018-04-04 | Disposition: A | Discharge status: Home / Self Care | Payer: No Typology Code available for payment source | Attending: Emergency Medicine | Admitting: Emergency Medicine

## 2018-04-04 ENCOUNTER — Other Ambulatory Visit: Payer: Self-pay

## 2018-04-04 ENCOUNTER — Emergency Department (HOSPITAL_COMMUNITY): Payer: No Typology Code available for payment source

## 2018-04-04 DIAGNOSIS — R69 Illness, unspecified: Secondary | ICD-10-CM

## 2018-04-04 DIAGNOSIS — Z79899 Other long term (current) drug therapy: Secondary | ICD-10-CM | POA: Diagnosis not present

## 2018-04-04 DIAGNOSIS — J111 Influenza due to unidentified influenza virus with other respiratory manifestations: Secondary | ICD-10-CM | POA: Diagnosis not present

## 2018-04-04 DIAGNOSIS — J45909 Unspecified asthma, uncomplicated: Secondary | ICD-10-CM | POA: Insufficient documentation

## 2018-04-04 DIAGNOSIS — E119 Type 2 diabetes mellitus without complications: Secondary | ICD-10-CM | POA: Diagnosis not present

## 2018-04-04 DIAGNOSIS — R52 Pain, unspecified: Secondary | ICD-10-CM | POA: Diagnosis present

## 2018-04-04 DIAGNOSIS — I10 Essential (primary) hypertension: Secondary | ICD-10-CM | POA: Insufficient documentation

## 2018-04-04 LAB — INFLUENZA PANEL BY PCR (TYPE A & B)
INFLBPCR: NEGATIVE
Influenza A By PCR: NEGATIVE

## 2018-04-04 MED ORDER — ALBUTEROL SULFATE HFA 108 (90 BASE) MCG/ACT IN AERS: 1.0000 | INHALATION_SPRAY | Freq: Four times a day (QID) | RESPIRATORY_TRACT | 0 refills | Status: DC | PRN

## 2018-04-04 MED ORDER — PREDNISONE 20 MG PO TABS: 40.0000 mg | ORAL_TABLET | Freq: Every day | ORAL | 0 refills | Status: DC

## 2018-04-04 MED ORDER — SODIUM CHLORIDE 0.9 % IV BOLUS
1000.0000 mL | Freq: Once | INTRAVENOUS | Status: AC
  Administered 2018-04-04: 1000 mL via INTRAVENOUS

## 2018-04-04 MED ORDER — KETOROLAC TROMETHAMINE 30 MG/ML IJ SOLN
15.0000 mg | Freq: Once | INTRAMUSCULAR | Status: AC
  Administered 2018-04-04: 15 mg via INTRAVENOUS
  Filled 2018-04-04: qty 1

## 2018-04-04 NOTE — Discharge Instructions (Signed)
As discussed, today's evaluation has been generally reassuring. Your influenza-like illness may be bronchitis, or another viral infection. It is important to take medication as directed, monitor your condition carefully, and do not hesitate to return here for concerning changes. Otherwise follow-up with your physician.

## 2018-04-04 NOTE — ED Provider Notes (Signed)
[facility]-EMERGENCY DEPT Provider Note   CSN: [id number] Arrival date & time: 04/04/18  9169     History   Chief Complaint Chief Complaint  Patient presents with  . flu symptoms  . Emesis  . Diarrhea    HPI Luis Boyer is a 28 y.o. male.  HPI Patient presents with concern of generalized discomfort, diarrhea, anorexia, subjective fever. Onset was 3 or 4 days ago. Since onset symptoms have been persistent, worsening. No relief with OTC medication. Patient acknowledges history of hypertension, obesity, did not receive influenza vaccine this year. He is here with a male companion who assists with the HPI.  Past Medical History:  Diagnosis Date  . ADD (attention deficit disorder)   . Asthma   . Bipolar 1 disorder (HCC)   . Diabetes mellitus type 2 in obese (HCC) 08/02/2016  . Hypertension   . Obesity     Patient Active Problem List   Diagnosis Date Noted  . Anxiety and depression 03/16/2017  . Morbid obesity (HCC) 03/16/2017  . Hypertension   . Diabetes mellitus type 2 in obese (HCC) 08/02/2016  . DKA (diabetic ketoacidoses) (HCC) 06/23/2016  . Cellulitis of abdominal wall 06/23/2016  . Enlarged lymph nodes 04/19/2014  . Rectal bleed 12/20/2011  . Constipation due to slow transit 12/20/2011  . Angioedema 12/20/2011  . BIPOLAR DISORDER UNSPECIFIED 01/17/2007  . ANGER 11/29/2006  . ADD 11/29/2006  . Essential hypertension 06/08/2006  . ASTHMA 06/08/2006    Past Surgical History:  Procedure Laterality Date  . NO PAST SURGERIES          Home Medications    Prior to Admission medications   Medication Sig Start Date End Date Taking? Authorizing Provider  amLODipine (NORVASC) 5 MG tablet Take 1 tablet (5 mg total) by mouth daily. 11/23/17   Sharlene Dory, DO  Multiple Vitamin (MULTIVITAMIN WITH MINERALS) TABS tablet Take 1 tablet by mouth daily.    [provider]  sertraline (ZOLOFT) 50 MG tablet Take 1 tablet  (50 mg total) by mouth daily. Take 1/2 tab daily for first 2 weeks. 05/17/17   Sharlene Dory, DO    Family History Family History  Problem Relation Age of Onset  . Cervical cancer Mother   . Diabetes Father   . Stroke Father   . Hypertension Father   . Hyperlipidemia Father   . Hyperlipidemia Brother   . Hypertension Brother   . Breast cancer Paternal Aunt   . Breast cancer Paternal Grandmother   . Colon cancer Neg Hx     Social History Social History   Tobacco Use  . Smoking status: Never Smoker  . Smokeless tobacco: Never Used  Substance Use Topics  . Alcohol use: No  . Drug use: No     Allergies   Celebrex [celecoxib] and Ace inhibitors   Review of Systems Review of Systems  Constitutional:       Per HPI, otherwise negative  HENT:       Per HPI, otherwise negative  Respiratory:       Per HPI, otherwise negative  Cardiovascular:       Per HPI, otherwise negative  Gastrointestinal: Positive for diarrhea and nausea.  Endocrine:       Negative aside from HPI  Genitourinary:       Neg aside from HPI   Musculoskeletal:       Per HPI, otherwise negative  Skin: Negative.   Neurological: Negative for syncope.  Physical Exam Updated Vital Signs BP (!) 153/107 (BP Location: Right Wrist)   Pulse 97   Temp 98.5 F (36.9 C) (Oral)   Resp 16   Ht 6' (1.829 m)   Wt (!) 168.7 kg   SpO2 99%   BMI 50.45 kg/m   Physical Exam Vitals signs and nursing note reviewed.  Constitutional:      General: He is not in acute distress.    Appearance: He is well-developed.     Comments: Obese young male awake and alert  HENT:     Head: Normocephalic and atraumatic.  Eyes:     Conjunctiva/sclera: Conjunctivae normal.  Cardiovascular:     Rate and Rhythm: Regular rhythm. Tachycardia present.  Pulmonary:     Effort: Pulmonary effort is normal. No respiratory distress.     Breath sounds: No stridor.  Abdominal:     General: There is no distension.      Comments: No abdominal discomfort  Skin:    General: Skin is warm and dry.  Neurological:     Mental Status: He is alert and oriented to person, place, and time.      ED Treatments / Results  Labs (all labs ordered are listed, but only abnormal results are displayed) Labs Reviewed  INFLUENZA PANEL BY PCR (TYPE A & B)    EKG None  Radiology Dg Chest 2 View  Result Date: 04/04/2018 CLINICAL DATA:  Flu like illness. EXAM: CHEST - 2 VIEW COMPARISON:  10/01/2016 FINDINGS: The cardiomediastinal silhouette is unchanged and within normal limits. No airspace consolidation, edema, pleural effusion, pneumothorax is identified. No acute osseous abnormality is seen. IMPRESSION: No active cardiopulmonary disease. Electronically Signed   By: Sebastian Ache M.D.   On: 04/04/2018 10:29    Procedures Procedures (including critical care time)  Medications Ordered in ED Medications  sodium chloride 0.9 % bolus 1,000 mL (0 mLs Intravenous Stopped 04/04/18 1152)  ketorolac (TORADOL) 30 MG/ML injection 15 mg (15 mg Intravenous Given 04/04/18 1152)     Initial Impression / Assessment and Plan / ED Course  I have reviewed the triage vital signs and the nursing notes.  Pertinent labs & imaging results that were available during my care of the patient were reviewed by me and considered in my medical decision making (see chart for details).     1:45 PM On repeat exam the patient is awake, alert. No additional vomiting, he remains afebrile, hemodynamically unremarkable. I reviewed the x-ray and labs with patient and his male companion. No evidence for pneumonia, no evidence for influenza. He has received fluids, Toradol, states that he feels somewhat better. We discussed possibilities for his ongoing illness, consistent with influenza-like illness, some concern for bronchitis.  Patient notes that he did have asthma as a child. Patient discharged in stable condition to follow-up with primary  care.  Final Clinical Impressions(s) / ED Diagnoses  Influenza like illness   Gerhard Munch, MD 04/04/18 1346

## 2018-04-04 NOTE — ED Notes (Signed)
Patient transported to X-ray 

## 2018-04-04 NOTE — ED Triage Notes (Signed)
Patient c/o chills, vomiting, diarrhea, sore throat, nasal congestion, body aches x 3 days.

## 2018-06-06 ENCOUNTER — Encounter: Payer: Self-pay | Admitting: Medical

## 2018-06-06 ENCOUNTER — Other Ambulatory Visit: Payer: Self-pay

## 2018-06-06 ENCOUNTER — Ambulatory Visit (INDEPENDENT_AMBULATORY_CARE_PROVIDER_SITE_OTHER): Payer: PRIVATE HEALTH INSURANCE | Admitting: Medical

## 2018-06-06 VITALS — BP 137/97 | HR 87 | Temp 98.5°F

## 2018-06-06 DIAGNOSIS — I10 Essential (primary) hypertension: Secondary | ICD-10-CM

## 2018-06-06 DIAGNOSIS — J301 Allergic rhinitis due to pollen: Secondary | ICD-10-CM | POA: Diagnosis not present

## 2018-06-06 DIAGNOSIS — J029 Acute pharyngitis, unspecified: Secondary | ICD-10-CM

## 2018-06-06 DIAGNOSIS — J3489 Other specified disorders of nose and nasal sinuses: Secondary | ICD-10-CM

## 2018-06-06 MED ORDER — FLUTICASONE PROPIONATE 50 MCG/ACT NA SUSP: 2.0000 | Freq: Every day | NASAL | 1 refills | Status: DC

## 2018-06-06 MED ORDER — BENZONATATE 100 MG PO CAPS: 100.0000 mg | ORAL_CAPSULE | Freq: Three times a day (TID) | ORAL | 0 refills | Status: DC | PRN

## 2018-06-06 MED ORDER — LEVOCETIRIZINE DIHYDROCHLORIDE 5 MG PO TABS: 5.0000 mg | ORAL_TABLET | Freq: Every evening | ORAL | 3 refills | Status: DC

## 2018-06-06 MED ORDER — AZITHROMYCIN 250 MG PO TABS: ORAL_TABLET | ORAL | 0 refills | Status: DC

## 2018-06-06 NOTE — Progress Notes (Signed)
Subjective:    Patient ID: Luis Boyer, male    DOB: [birth year], 28 y.o.   MRN: [id number]  HPI  Virtual Visit via Video Note  I connected with Zaedon Passanisi Demelo on 06/06/18 at  1:00 PM EDT by a video enabled telemedicine application and verified that I am speaking with the correct person using two identifiers.   I discussed the limitations of evaluation and management by telemedicine and the availability of in person appointments. The patient expressed understanding and agreed to proceed.  Pt works [employer]. When he sneezed and coughed they sent him home.    History of Present Illness: Pt had st this morning when he first got. Hurts to swallow his own saliva. No known contacts with person/contact with strep. Pt denies any fever of 100(clarified as someone documented this in epic). Occasional rare cough. Some nasal congestion.Pt has been sneezing from time to time. No body aches/myalgia. Pt does report allergies in spring when pollen falls. He usually takes equate brand of allergy meds. Faint ha. Faint frontal and maxillary sinus pressure.    Observations/Objective: No acute distress. Sounds little congested during interview via video.  Assessment and Plan: Most of your symptoms and history are consistent with allergic rhinitis.  The sore throat may be from postnasal drainage associated with allergies.  Also some recent sinus type pressure tension associated with allergies.  No fever documented.  No significant cough presently other than occasional cough that seems consistent with allergies.  Did counsel with patient today regarding the need to proceed with caution and I will treat him a little bit more aggressively than usual and let the viral pandemic situation.  Will treat with Xyzal antihistamine, Flonase nasal spray, benzonatate for cough and making a azithromycin antibiotic available.  The antibiotic mad available to use within the next 24 to 36 hours if his sore throat or sinus  pressure persist or worsens despite the above measures.  Presently advised not to take any Tylenol or NSAID.  But check his temperature daily and notify me if any temperatures 100 or above.  If he develops a fever with cough then I would probably directed to urgent care or ED for further evaluation.  Presently writing him return to work note for Monday provided he has no fever or cough.  Asked patient to update me by my chart how he is doing on Thursday morning or late afternoon.  Patient expressed understanding regarding the plan.  Follow Up Instructions:    I discussed the assessment and treatment plan with the patient. The patient was provided an opportunity to ask questions and all were answered. The patient agreed with the plan and demonstrated an understanding of the instructions.   The patient was advised to call back or seek an in-person evaluation if the symptoms worsen or if the condition fails to improve as anticipated.  I provided 25 minutes of non-face-to-face time during this encounter.  50% of time was spent counseling patient on treatment plan going forward and signs and symptoms to watch out for in the event his symptoms change or worsen.  Extra time was spent counseling him in light of the viral pandemic situation.  He will update me as explained in the assessment and plan.   Esperanza Richters, PA-C    Review of Systems  Constitutional: Negative for chills, fatigue and fever.  HENT: Positive for congestion, postnasal drip, sinus pressure, sinus pain and sore throat. Negative for nosebleeds and trouble swallowing.  Respiratory: Positive for cough. Negative for chest tightness, shortness of breath and wheezing.   Cardiovascular: Negative for chest pain and palpitations.  Gastrointestinal: Negative for abdominal pain.  Musculoskeletal: Negative for back pain.  Hematological: Negative for adenopathy. Does not bruise/bleed easily.  Psychiatric/Behavioral: Negative for behavioral  problems and confusion.       Objective:   Physical Exam  na      Assessment & Plan:

## 2018-06-06 NOTE — Patient Instructions (Addendum)
Most of your symptoms and history are consistent with allergic rhinitis.  The sore throat may be from postnasal drainage associated with allergies.  Also some recent sinus type pressure tension associated with allergies.  No fever documented.  No significant cough presently other than occasional cough that seems consistent with allergies.  Did counsel with patient today regarding the need to proceed with caution and I will treat him a little bit more aggressively than usual and let the viral pandemic situation.  Will treat with Xyzal antihistamine, Flonase nasal spray, benzonatate for cough and making a azithromycin antibiotic available.  The antibiotic mad available to use within the next 24 to 36 hours if his sore throat or sinus pressure persist or worsens despite the above measures.  Presently advised not to take any Tylenol or NSAID.  But check his temperature daily and notify me if any temperatures 100 or above.  If he develops a fever with cough then I would probably directed to urgent care or ED for further evaluation.  Presently writing him return to work note for Monday provided he has no fever or cough.  Asked patient to update me by my chart how he is doing on Thursday morning or late afternoon.  Patient expressed understanding regarding the plan.

## 2018-06-07 ENCOUNTER — Other Ambulatory Visit: Payer: Self-pay

## 2018-06-07 MED ORDER — SERTRALINE HCL 50 MG PO TABS: 50.0000 mg | ORAL_TABLET | Freq: Every day | ORAL | 3 refills | Status: DC

## 2018-07-22 ENCOUNTER — Encounter: Payer: Self-pay | Admitting: Family Medicine

## 2018-07-22 DIAGNOSIS — I10 Essential (primary) hypertension: Secondary | ICD-10-CM

## 2018-07-24 MED ORDER — AMLODIPINE BESYLATE 5 MG PO TABS: 5.0000 mg | ORAL_TABLET | Freq: Every day | ORAL | 0 refills | Status: DC

## 2018-07-26 ENCOUNTER — Telehealth: Payer: PRIVATE HEALTH INSURANCE

## 2018-07-26 ENCOUNTER — Ambulatory Visit (INDEPENDENT_AMBULATORY_CARE_PROVIDER_SITE_OTHER)
Admission: RE | Admit: 2018-07-26 | Discharge: 2018-07-26 | Disposition: A | Discharge status: Home / Self Care | Payer: No Typology Code available for payment source | Source: Ambulatory Visit

## 2018-07-26 DIAGNOSIS — B349 Viral infection, unspecified: Secondary | ICD-10-CM | POA: Diagnosis not present

## 2018-07-26 NOTE — Discharge Instructions (Signed)
This is most likely viral You can take OTC medications as needed for your symptoms.  Follow up as needed for continued or worsening symptoms Work note given

## 2018-07-26 NOTE — ED Provider Notes (Signed)
Virtual Visit via Video Note:  Luis Boyer  initiated request for Telemedicine visit with [facility] team. I connected with Luis Boyer  on 07/26/2018 at 8:52 AM  for a synchronized telemedicine visit using a video enabled HIPPA compliant telemedicine application. I verified that I am speaking with Luis Boyer  using two identifiers. Janace Aris, NP  was physically located in a [facility] Urgent care site and TALAL ADAMY was located at a different location.   The limitations of evaluation and management by telemedicine as well as the availability of in-person appointments were discussed. Patient was informed that he  may incur a bill ( including co-pay) for this virtual visit encounter. Luis Boyer  expressed understanding and gave verbal consent to proceed with virtual visit.     History of Present Illness:Luis Boyer  is a 28 y.o. male presents with dry cough, sore throat, headache and low-grade fever.  Reports this started Monday.  Symptoms have been constant.  He has not been taking anything for his symptoms.  He does have a history of allergic rhinitis.  He was treated for similar illness approximately 2 months ago.  Denies any known sick contacts or COVID exposure.  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  No chest pain, shortness of breath.  Past Medical History:  Diagnosis Date  . ADD (attention deficit disorder)   . Asthma   . Bipolar 1 disorder (HCC)   . Diabetes mellitus type 2 in obese (HCC) 08/02/2016  . Hypertension   . Obesity     Allergies  Allergen Reactions  . Celebrex [Celecoxib] Shortness Of Breath  . Ace Inhibitors Other (See Comments)    angioedema        Observations/Objective:  GENERAL APPEARANCE: Well developed, well nourished, alert and cooperative, and appears to be in no acute distress. HEAD: normocephalic. Non labored breathing, no dyspnea or distress Skin: Skin normal color  PSYCHIATRIC: The mental examination  revealed the patient was oriented to person, place, and time. The patient was able to demonstrate good judgement and reason, without hallucinations, abnormal affect or abnormal behaviors during the examination. Patient is not suicidal     Assessment and Plan: Symptoms consistent with viral illness.  Not convinced this is COVID.  Work note given and recommended using over-the-counter medications as needed for symptoms   Follow Up Instructions: Follow up as needed for continued or worsening symptoms     I discussed the assessment and treatment plan with the patient. The patient was provided an opportunity to ask questions and all were answered. The patient agreed with the plan and demonstrated an understanding of the instructions.   The patient was advised to call back or seek an in-person evaluation if the symptoms worsen or if the condition fails to improve as anticipated.    Janace Aris, NP  07/26/2018 8:52 AM         Janace Aris, NP 07/26/18 [phone]

## 2018-08-05 ENCOUNTER — Telehealth: Payer: PRIVATE HEALTH INSURANCE | Admitting: Nurse Practitioner

## 2018-08-05 DIAGNOSIS — M5442 Lumbago with sciatica, left side: Secondary | ICD-10-CM

## 2018-08-05 MED ORDER — CYCLOBENZAPRINE HCL 10 MG PO TABS: 10.0000 mg | ORAL_TABLET | Freq: Three times a day (TID) | ORAL | 1 refills | Status: DC | PRN

## 2018-08-05 MED ORDER — NAPROXEN 500 MG PO TABS: 500.0000 mg | ORAL_TABLET | Freq: Two times a day (BID) | ORAL | 1 refills | Status: DC

## 2018-08-05 NOTE — Progress Notes (Signed)

## 2018-08-05 NOTE — Addendum Note (Signed)
Addended by: Bennie Pierini on: 08/05/2018 05:49 PM   Modules accepted: Orders

## 2018-08-07 ENCOUNTER — Telehealth: Payer: Self-pay | Admitting: Family Medicine

## 2018-08-07 ENCOUNTER — Ambulatory Visit (INDEPENDENT_AMBULATORY_CARE_PROVIDER_SITE_OTHER): Payer: No Typology Code available for payment source | Admitting: Family Medicine

## 2018-08-07 ENCOUNTER — Other Ambulatory Visit: Payer: Self-pay

## 2018-08-07 ENCOUNTER — Encounter: Payer: Self-pay | Admitting: Family Medicine

## 2018-08-07 DIAGNOSIS — E1169 Type 2 diabetes mellitus with other specified complication: Secondary | ICD-10-CM | POA: Diagnosis not present

## 2018-08-07 DIAGNOSIS — M545 Low back pain, unspecified: Secondary | ICD-10-CM

## 2018-08-07 DIAGNOSIS — I10 Essential (primary) hypertension: Secondary | ICD-10-CM

## 2018-08-07 DIAGNOSIS — E669 Obesity, unspecified: Secondary | ICD-10-CM

## 2018-08-07 MED ORDER — CYCLOBENZAPRINE HCL 10 MG PO TABS: 10.0000 mg | ORAL_TABLET | Freq: Three times a day (TID) | ORAL | 1 refills | Status: DC | PRN

## 2018-08-07 MED ORDER — MELOXICAM 15 MG PO TABS: 15.0000 mg | ORAL_TABLET | Freq: Every day | ORAL | 0 refills | Status: DC

## 2018-08-07 MED ORDER — AMLODIPINE BESYLATE 5 MG PO TABS: 5.0000 mg | ORAL_TABLET | Freq: Every day | ORAL | 5 refills | Status: DC

## 2018-08-07 NOTE — Telephone Encounter (Signed)
Pt states he will call back and schedule his CPE once he review his work schedule

## 2018-08-07 NOTE — Progress Notes (Signed)
Musculoskeletal Exam  Patient: Luis Boyer DOB: [birth year]  DOS: 08/07/2018  SUBJECTIVE:  Chief Complaint:  CC: LBP Derryl Chavana Falletta is a 28 y.o.  male for evaluation and treatment of his back pain. Due to COVID-19 pandemic, we are interacting via web portal for an electronic face-to-face visit. I verified patient's ID using 2 identifiers. Patient agreed to proceed with visit via this method. Patient is at home, I am at office. Patient and I are present for visit.   Onset:  2 days ago. Bent over and felt pain.  Location: lower Character:  aching and sharp  Progression of issue:  is unchanged Associated symptoms: unable to stand up straight w/o pain No bruising, swelling Denies bowel/bladder incontinence or weakness Treatment: to date has been ice, OTC NSAIDS and heat.   Neurovascular symptoms: no  ROS: Musculoskeletal/Extremities: +back pain Neurologic: no numbness, tingling no weakness   Past Medical History:  Diagnosis Date  . ADD (attention deficit disorder)   . Asthma   . Bipolar 1 disorder (HCC)   . Diabetes mellitus type 2 in obese (HCC) 08/02/2016  . Hypertension   . Obesity     Objective: No conversational dyspnea Age appropriate judgment and insight Nml affect and mood  Assessment:  Acute bilateral low back pain without sciatica - Plan: meloxicam (MOBIC) 15 MG tablet, cyclobenzaprine (FLEXERIL) 10 MG tablet  Diabetes mellitus type 2 in obese (HCC) - Plan: Microalbumin / creatinine urine ratio, Hemoglobin A1c  Essential hypertension - Plan: amLODipine (NORVASC) 5 MG tablet  Plan: Orders as above. Stretches/exercises, heat, ice, Tylenol, NSAIDs. F/u for DM visit. The patient voiced understanding and agreement to the plan.   Jilda Roche [place], DO 08/07/18  9:26 AM

## 2018-08-11 ENCOUNTER — Encounter: Payer: Self-pay | Admitting: Family Medicine

## 2018-08-11 ENCOUNTER — Other Ambulatory Visit: Payer: Self-pay

## 2018-08-11 ENCOUNTER — Ambulatory Visit (INDEPENDENT_AMBULATORY_CARE_PROVIDER_SITE_OTHER): Payer: PRIVATE HEALTH INSURANCE | Admitting: Family Medicine

## 2018-08-11 DIAGNOSIS — M545 Low back pain, unspecified: Secondary | ICD-10-CM

## 2018-08-11 MED ORDER — PREDNISONE 20 MG PO TABS: 40.0000 mg | ORAL_TABLET | Freq: Every day | ORAL | 0 refills | Status: AC

## 2018-08-11 NOTE — Progress Notes (Signed)
Chief Complaint  Patient presents with  . Back Pain    Subjective: Patient is a 28 y.o. male here for f/u lbp. Due to COVID-19 pandemic, we are interacting via web portal for an electronic face-to-face visit. I verified patient's ID using 2 identifiers. Patient agreed to proceed with visit via this method. Patient is at home, I am at office. Patient and I are present for visit.   No better since NSAID and muscle relaxant. Latter helpful. Tried to do stretches/exercises. No new s/s's, no new injury. Tingling in feet when he bends forward. Denies weakness.   ROS: MSK: As noted in HPI  Past Medical History:  Diagnosis Date  . ADD (attention deficit disorder)   . Asthma   . Bipolar 1 disorder (HCC)   . Diabetes mellitus type 2 in obese (HCC) 08/02/2016  . Hypertension   . Obesity     Objective: No conversational dyspnea Age appropriate judgment and insight Nml affect and mood  Assessment and Plan: Acute bilateral low back pain without sciatica - Plan: predniSONE (DELTASONE) 20 MG tablet  5 day pred burst. Cont heat, stretches/exercises, msc relaxant, Tylenol.  F/u in 5 d if no better, in person. May need injection vs PT referral.  The patient voiced understanding and agreement to the plan.  Jilda Roche [place], DO 08/11/18  2:25 PM

## 2018-08-11 NOTE — Progress Notes (Unsigned)
Pt states he needs an out work note that accounts for all days until after 08/21/2018. Please advise

## 2018-08-14 ENCOUNTER — Encounter: Payer: Self-pay | Admitting: Medical

## 2018-08-14 ENCOUNTER — Ambulatory Visit (INDEPENDENT_AMBULATORY_CARE_PROVIDER_SITE_OTHER): Payer: PRIVATE HEALTH INSURANCE | Admitting: Medical

## 2018-08-14 ENCOUNTER — Ambulatory Visit (HOSPITAL_BASED_OUTPATIENT_CLINIC_OR_DEPARTMENT_OTHER)
Admission: RE | Admit: 2018-08-14 | Discharge: 2018-08-14 | Disposition: A | Discharge status: Home / Self Care | Payer: PRIVATE HEALTH INSURANCE | Source: Ambulatory Visit | Attending: Medical | Admitting: Medical

## 2018-08-14 ENCOUNTER — Other Ambulatory Visit: Payer: Self-pay

## 2018-08-14 ENCOUNTER — Encounter: Payer: Self-pay | Admitting: Family Medicine

## 2018-08-14 VITALS — BP 164/94 | HR 114 | Temp 99.9°F | Resp 16 | Ht 72.0 in | Wt 391.2 lb

## 2018-08-14 DIAGNOSIS — M545 Low back pain, unspecified: Secondary | ICD-10-CM

## 2018-08-14 MED ORDER — TRAMADOL HCL 50 MG PO TABS: 50.0000 mg | ORAL_TABLET | Freq: Four times a day (QID) | ORAL | 0 refills | Status: AC | PRN

## 2018-08-14 NOTE — Progress Notes (Signed)
Subjective:    Patient ID: Luis Boyer, male    DOB: 08/26/1990, 28 y.o.   MRN: [id number]  HPI  Pt states 9 days of lower back pain. States onset bending over at work. Then later on weekend at home bent over and felt pop in lower back. Pain radiated to both paralumber area initially. Last 5 days radiating to right side more rt si area. But no radiating pain to legs. No legs weakness.no loss of bladder function.  Pt states not better with prednisone completely. He states helped little bit for 2 days but today back pain still moderate. Pain is 6-7/10.   Pt can't stand up or lift things. And missing work due to pain.   Pt did not take his bp med today. He states if takes bp will drop into normal range.   Review of Systems  Constitutional: Negative for chills, fatigue and fever.  Respiratory: Negative for cough, chest tightness, shortness of breath, wheezing and stridor.   Cardiovascular: Negative for chest pain and palpitations.  Gastrointestinal: Negative for abdominal pain.  Musculoskeletal: Positive for back pain.  Hematological: Negative for adenopathy. Does not bruise/bleed easily.  Psychiatric/Behavioral: Negative for behavioral problems, confusion, dysphoric mood and sleep disturbance.     Past Medical History:  Diagnosis Date  . ADD (attention deficit disorder)   . Asthma   . Bipolar 1 disorder (HCC)   . Diabetes mellitus type 2 in obese (HCC) 08/02/2016  . Hypertension   . Obesity      Social History   Socioeconomic History  . Marital status: Single    Spouse name: Not on file  . Number of children: Not on file  . Years of education: Not on file  . Highest education level: Not on file  Occupational History  . Occupation: unemployed  Social Needs  . Financial resource strain: Not on file  . Food insecurity:    Worry: Not on file    Inability: Not on file  . Transportation needs:    Medical: Not on file    Non-medical: Not on file  Tobacco Use  .  Smoking status: Never Smoker  . Smokeless tobacco: Never Used  Substance and Sexual Activity  . Alcohol use: No  . Drug use: No  . Sexual activity: Never    Partners: Female  Lifestyle  . Physical activity:    Days per week: Not on file    Minutes per session: Not on file  . Stress: Not on file  Relationships  . Social connections:    Talks on phone: Not on file    Gets together: Not on file    Attends religious service: Not on file    Active member of club or organization: Not on file    Attends meetings of clubs or organizations: Not on file    Relationship status: Not on file  . Intimate partner violence:    Fear of current or ex partner: Not on file    Emotionally abused: Not on file    Physically abused: Not on file    Forced sexual activity: Not on file  Other Topics Concern  . Not on file  Social History Narrative   Exercise-- bike    Past Surgical History:  Procedure Laterality Date  . NO PAST SURGERIES      Family History  Problem Relation Age of Onset  . Cervical cancer Mother   . Diabetes Father   . Stroke Father   . Hypertension  Father   . Hyperlipidemia Father   . Hyperlipidemia Brother   . Hypertension Brother   . Breast cancer Paternal Aunt   . Breast cancer Paternal Grandmother   . Colon cancer Neg Hx     Allergies  Allergen Reactions  . Celebrex [Celecoxib] Shortness Of Breath  . Ace Inhibitors Other (See Comments)    angioedema    Current Outpatient Medications on File Prior to Visit  Medication Sig Dispense Refill  . albuterol (PROVENTIL HFA;VENTOLIN HFA) 108 (90 Base) MCG/ACT inhaler Inhale 1-2 puffs into the lungs every 6 (six) hours as needed for wheezing or shortness of breath. 1 Inhaler 0  . amLODipine (NORVASC) 5 MG tablet Take 1 tablet (5 mg total) by mouth daily. 30 tablet 5  . cyclobenzaprine (FLEXERIL) 10 MG tablet Take 1 tablet (10 mg total) by mouth 3 (three) times daily as needed for muscle spasms. 30 tablet 1  .  fluticasone (FLONASE) 50 MCG/ACT nasal spray Place 2 sprays into both nostrils daily. 16 g 1  . levocetirizine (XYZAL) 5 MG tablet Take 1 tablet (5 mg total) by mouth every evening. 30 tablet 3  . Multiple Vitamin (MULTIVITAMIN WITH MINERALS) TABS tablet Take 1 tablet by mouth daily.    . predniSONE (DELTASONE) 20 MG tablet Take 2 tablets (40 mg total) by mouth daily with breakfast for 5 days. 10 tablet 0  . sertraline (ZOLOFT) 50 MG tablet Take 1 tablet (50 mg total) by mouth daily. Take 1/2 tab daily for first 2 weeks. 30 tablet 3   No current facility-administered medications on file prior to visit.     BP (!) 164/94   Pulse (!) 114   Temp 99.9 F (37.7 C)   Resp 16   Ht 6' (1.829 m)   Wt (!) 391 lb 3.2 oz (177.4 kg)   SpO2 100%   BMI 53.06 kg/m       Objective:   Physical Exam  General Appearance- Not in acute distress.    Chest and Lung Exam Auscultation: Breath sounds:-Normal. Clear even and unlabored. Adventitious sounds:- No Adventitious sounds.  Cardiovascular Auscultation:Rythm - Regular, rate and rythm. Heart Sounds -Normal heart sounds.  Back Mid lumbar spine tenderness to palpation.(rt si area tender) Pain on straight leg lift. Pain on changing positions.        Assessment & Plan:  For low back pain with pain toward rt si area, I will rx tramadol. Advise continue prednisone and flexeril.  Will get xray of lower back today since pain persists.  Will go ahead and refer you to sports medicine MD since this is 3rd visit with [place] and you are missing work.  Bp is high and asked pt to take bp meds.  Follow up as needed  Mackie Pai, PA-C

## 2018-08-14 NOTE — Patient Instructions (Addendum)
For low back pain with pain toward rt si area, I will rx tramadol. Advise continue prednisone and flexeril.  Will get xray of lower back today since pain persists.  Will go ahead and refer you to sports medicine MD since this is 3rd visit with [place] and you are missing work.   Bp is high and asked pt to take bp meds.  Follow up as needed

## 2018-08-15 ENCOUNTER — Encounter: Payer: Self-pay | Admitting: Medical

## 2018-09-07 ENCOUNTER — Telehealth: Payer: Self-pay | Admitting: Family Medicine

## 2018-09-07 NOTE — Telephone Encounter (Signed)
Pt dropped off document to be filled out by provider (FMLA 2 pages) Pt would like to be called when document ready. Document put at front office tray under providers name.

## 2018-09-09 ENCOUNTER — Telehealth: Payer: PRIVATE HEALTH INSURANCE | Admitting: Family

## 2018-09-09 ENCOUNTER — Encounter: Payer: Self-pay | Admitting: Family

## 2018-09-09 DIAGNOSIS — M5440 Lumbago with sciatica, unspecified side: Secondary | ICD-10-CM | POA: Diagnosis not present

## 2018-09-09 MED ORDER — CYCLOBENZAPRINE HCL 10 MG PO TABS: 10.0000 mg | ORAL_TABLET | Freq: Three times a day (TID) | ORAL | 0 refills | Status: DC | PRN

## 2018-09-09 NOTE — Progress Notes (Signed)
We are sorry that you are not feeling well.  Here is how we plan to help!  Based on what you have shared with me it looks like you mostly have acute back pain.  Acute back pain is defined as musculoskeletal pain that can resolve in 1-3 weeks with conservative treatment.  I have prescribed Flexeril 10 mg every eight hours as needed which is a muscle relaxer  Please keep in mind that muscle relaxer's can cause fatigue and should not be taken while at work or driving.  Back pain is very common.  The pain often gets better over time.  The cause of back pain is usually not dangerous.  Most people can learn to manage their back pain on their own.  Home Care  Stay active.  Start with short walks on flat ground if you can.  Try to walk farther each day.  Do not sit, drive or stand in one place for more than 30 minutes.  Do not stay in bed.  Do not avoid exercise or work.  Activity can help your back heal faster.  Be careful when you bend or lift an object.  Bend at your knees, keep the object close to you, and do not twist.  Sleep on a firm mattress.  Lie on your side, and bend your knees.  If you lie on your back, put a pillow under your knees.  Only take medicines as told by your doctor.  Put ice on the injured area.  Put ice in a plastic bag  Place a towel between your skin and the bag  Leave the ice on for 15-20 minutes, 3-4 times a day for the first 2-3 days. 210 After that, you can switch between ice and heat packs.  Ask your doctor about back exercises or massage.  Avoid feeling anxious or stressed.  Find good ways to deal with stress, such as exercise.  Get Help Right Way If:  Your pain does not go away with rest or medicine.  Your pain does not go away in 1 week.  You have new problems.  You do not feel well.  The pain spreads into your legs.  You cannot control when you poop (bowel movement) or pee (urinate)  You feel sick to your stomach (nauseous) or throw up  (vomit)  You have belly (abdominal) pain.  You feel like you may pass out (faint).  If you develop a fever.  Make Sure you:  Understand these instructions.  Will watch your condition  Will get help right away if you are not doing well or get worse.  Your e-visit answers were reviewed by a board certified advanced clinical practitioner to complete your personal care plan.  Depending on the condition, your plan could have included both over the counter or prescription medications.  If there is a problem please reply  once you have received a response from your provider.  Your safety is important to us.  If you have drug allergies check your prescription carefully.    You can use MyChart to ask questions about today's visit, request a non-urgent call back, or ask for a work or school excuse for 24 hours related to this e-Visit. If it has been greater than 24 hours you will need to follow up with your provider, or enter a new e-Visit to address those concerns.  You will get an e-mail in the next two days asking about your experience.  I hope that your e-visit has been   valuable and will speed your recovery. Thank you for using e-visits. Greater than 5 minutes, yet less than 10 minutes of time have been spent researching, coordinating, and implementing care for this patient today.  Thank you for the details you included in the comment boxes. Those details are very helpful in determining the best course of treatment for you and help us to provide the best care.  

## 2018-09-11 ENCOUNTER — Other Ambulatory Visit: Payer: Self-pay | Admitting: Family Medicine

## 2018-09-11 DIAGNOSIS — Z0279 Encounter for issue of other medical certificate: Secondary | ICD-10-CM

## 2018-09-11 NOTE — Progress Notes (Signed)
I am filling out his paperwork, does he need more time off or is he ready to go back?

## 2018-09-12 NOTE — Progress Notes (Signed)
He needs to go on light duty and return to work.

## 2018-09-13 NOTE — Telephone Encounter (Signed)
Form completed////gave charge sheet/copy to Jennifer////put original at the front for pickup Called the patient left detailed message form is completed and ready for pickup

## 2018-10-24 ENCOUNTER — Encounter: Payer: Self-pay | Admitting: Family Medicine

## 2018-11-21 ENCOUNTER — Other Ambulatory Visit: Payer: Self-pay

## 2018-11-22 ENCOUNTER — Encounter: Payer: Self-pay | Admitting: Family Medicine

## 2018-11-22 ENCOUNTER — Ambulatory Visit (INDEPENDENT_AMBULATORY_CARE_PROVIDER_SITE_OTHER): Payer: PRIVATE HEALTH INSURANCE | Admitting: Family Medicine

## 2018-11-22 ENCOUNTER — Telehealth: Payer: Self-pay | Admitting: Family Medicine

## 2018-11-22 ENCOUNTER — Ambulatory Visit: Payer: PRIVATE HEALTH INSURANCE | Admitting: Family Medicine

## 2018-11-22 DIAGNOSIS — G4700 Insomnia, unspecified: Secondary | ICD-10-CM

## 2018-11-22 DIAGNOSIS — E1169 Type 2 diabetes mellitus with other specified complication: Secondary | ICD-10-CM

## 2018-11-22 DIAGNOSIS — F419 Anxiety disorder, unspecified: Secondary | ICD-10-CM | POA: Diagnosis not present

## 2018-11-22 DIAGNOSIS — F329 Major depressive disorder, single episode, unspecified: Secondary | ICD-10-CM

## 2018-11-22 DIAGNOSIS — I10 Essential (primary) hypertension: Secondary | ICD-10-CM | POA: Diagnosis not present

## 2018-11-22 DIAGNOSIS — E669 Obesity, unspecified: Secondary | ICD-10-CM

## 2018-11-22 MED ORDER — AMLODIPINE BESYLATE 5 MG PO TABS: 5.0000 mg | ORAL_TABLET | Freq: Every day | ORAL | 2 refills | Status: DC

## 2018-11-22 MED ORDER — TRAZODONE HCL 50 MG PO TABS: 25.0000 mg | ORAL_TABLET | Freq: Every evening | ORAL | 3 refills | Status: DC | PRN

## 2018-11-22 MED ORDER — SERTRALINE HCL 50 MG PO TABS: 50.0000 mg | ORAL_TABLET | Freq: Every day | ORAL | 2 refills | Status: DC

## 2018-11-22 NOTE — Telephone Encounter (Signed)
LVM for patient to schedule 1 month f/u to recheck sleep.

## 2018-11-22 NOTE — Progress Notes (Signed)
Subjective:   Chief Complaint  Patient presents with  . Medication Refill    Luis Boyer is a 28 y.o. male here for follow-up of diabetes.  Due to COVID-19 pandemic, we are interacting via web portal for an electronic face-to-face visit. I verified patient's ID using 2 identifiers. Patient agreed to proceed with visit via this method. Patient is at home, I am at office. Patient and I are present for visit.   Jashan  Does not routinely check his sugars. Patient does not require insulin.   Medications include: diet controlled Diet is fair. Exercise: Physically active at work Due for eye exam. Gets flu shot at work.  Hypertension Patient presents for hypertension follow up. He does monitor home blood pressures. Blood pressures ranging on average from 180's/80's. He is compliant with medications. Patient has these side effects of medication: none He is adhering to a healthy diet overall. Exercise: active at work.  Hx of anxiety/depression. Takes Zoloft 50 mg/d. Reports this works well, no AE's, has been out for 2 weeks. No HI or SI ideation.  Anxiety could be contributing to falling asleep and staying asleep. Tried melatonin and various behavior techniques without relief.    Past Medical History:  Diagnosis Date  . ADD (attention deficit disorder)   . Asthma   . Bipolar 1 disorder ([place])   . Diabetes mellitus type 2 in obese ([place]) 08/02/2016  . Hypertension   . Obesity      Related testing: Date of retinal exam: Due, he will call ophtho team.  Pneumovax: done Flu Shot: going to get at work  Review of Systems: Pulmonary:  No SOB Cardiovascular:  No chest pain  Objective:  No conversational dyspnea Age appropriate judgment and insight Nml affect and mood  Assessment:   Diabetes mellitus type 2 in obese ([place])  Essential hypertension - Plan: amLODipine (NORVASC) 5 MG tablet  Anxiety and depression - Plan: sertraline (ZOLOFT) 50 MG tablet  Insomnia,  unspecified type - Plan: traZODone (DESYREL) 50 MG tablet   Plan:   Will ck labs at fu, needs urine test. Gets flu shot at work. Refill amlodipine, this works well for him. Cont Zoloft. Add trazodone.  Counseled on diet and exercise. F/u in 1 mo for sleep reck. The patient voiced understanding and agreement to the plan.  [place], DO 11/22/18 12:58 PM

## 2018-11-23 ENCOUNTER — Ambulatory Visit: Payer: PRIVATE HEALTH INSURANCE | Admitting: Family Medicine

## 2018-12-03 ENCOUNTER — Ambulatory Visit (INDEPENDENT_AMBULATORY_CARE_PROVIDER_SITE_OTHER)
Admission: RE | Admit: 2018-12-03 | Discharge: 2018-12-03 | Disposition: A | Discharge status: Home / Self Care | Payer: PRIVATE HEALTH INSURANCE | Source: Ambulatory Visit

## 2018-12-03 DIAGNOSIS — J029 Acute pharyngitis, unspecified: Secondary | ICD-10-CM | POA: Diagnosis not present

## 2018-12-03 DIAGNOSIS — J309 Allergic rhinitis, unspecified: Secondary | ICD-10-CM | POA: Diagnosis not present

## 2018-12-03 DIAGNOSIS — J301 Allergic rhinitis due to pollen: Secondary | ICD-10-CM

## 2018-12-03 MED ORDER — LIDOCAINE VISCOUS HCL 2 % MT SOLN: 15.0000 mL | OROMUCOSAL | 0 refills | Status: DC | PRN

## 2018-12-03 MED ORDER — LORATADINE 10 MG PO TABS: 10.0000 mg | ORAL_TABLET | Freq: Every day | ORAL | 0 refills | Status: DC

## 2018-12-03 MED ORDER — FLUTICASONE PROPIONATE 50 MCG/ACT NA SUSP: 1.0000 | Freq: Every day | NASAL | 0 refills | Status: DC

## 2018-12-03 NOTE — ED Provider Notes (Signed)
Virtual Visit via Video Note:  [name]  initiated request for Telemedicine visit with [facility] team. I connected with Paulanthony Gleaves Wich  on 12/03/2018 at 11:31 AM  for a synchronized telemedicine visit using a video enabled HIPPA compliant telemedicine application. I verified that I am speaking with Kathalene Frames [place]  using two identifiers. [place] Hall-Potvin, PA-C  was physically located in a [facility] site and Payden Docter Auman was located at a different location.   The limitations of evaluation and management by telemedicine as well as the availability of in-person appointments were discussed. Patient was informed that he  may incur a bill ( including co-pay) for this virtual visit encounter. Daeshawn Redmann Klopf  expressed understanding and gave verbal consent to proceed with virtual visit.     History of Present Illness:Luis Boyer  is a 28 y.o. male with history of obesity, asthma, seasonal allergies presents with 3-day course of sinus pressure, throat pain.  States that throat pain is worse in the morning, improves throughout the day.  Has been taking OTC cough/cold medication with relief of throat pain, and OTC allergy medication daily.  Patient not using any intranasal steroids.  No known sick contacts, fever, cough, shortness of breath.  Review of Systems  Constitutional: Negative for fever and malaise/fatigue.  Eyes: Negative for pain and redness.  Respiratory: Negative for cough and shortness of breath.   Cardiovascular: Negative for chest pain and palpitations.  Gastrointestinal: Negative for abdominal pain, diarrhea and vomiting.  Musculoskeletal: Negative for joint pain and myalgias.    Past Medical History:  Diagnosis Date  . ADD (attention deficit disorder)   . Asthma   . Bipolar 1 disorder ([place])   . Diabetes mellitus type 2 in obese ([place]) 08/02/2016  . Hypertension   . Obesity     Allergies  Allergen Reactions  . Celebrex [Celecoxib] Shortness  Of Breath  . Ace Inhibitors Other (See Comments)    angioedema        Observations/Objective: Obese 28 year old male Sitting in no acute distress.  Patient is able to speak in full sentences without coughing, sneezing, wheezing.  Negative hot potato/muffle voice.  Assessment and Plan: 1.  Sore throat Second to postnasal drip from allergic rhinitis.  Patient to continue daytime antihistamine, will add intranasal steroid, viscous lidocaine as adjuvant therapy for sinus pressure throat pain relief.  Return precautions discussed, patient verbalized understanding and is agreeable to plan.  Follow Up Instructions: Patient to seek in-person evaluation for persistent, worsening symptoms.   I discussed the assessment and treatment plan with the patient. The patient was provided an opportunity to ask questions and all were answered. The patient agreed with the plan and demonstrated an understanding of the instructions.   The patient was advised to call back or seek an in-person evaluation if the symptoms worsen or if the condition fails to improve as anticipated.  I provided 15 minutes of non-face-to-face time during this encounter.    [place], PA-C  12/03/2018 11:31 AM        Hall-Potvin, [place], PA-C 12/03/18 1233

## 2018-12-03 NOTE — Discharge Instructions (Addendum)
Take allergy medication every day, as well as 2 sprays of Flonase in each nostril daily x1 week, then 1 spray in each nostril daily thereafter. Gargle/spit lidocaine as needed for throat pain relief. Return for worsening nasal congestion, sinus pain, sore throat, fever.

## 2019-01-02 ENCOUNTER — Other Ambulatory Visit: Payer: Self-pay

## 2019-01-03 ENCOUNTER — Other Ambulatory Visit: Payer: Self-pay

## 2019-01-03 ENCOUNTER — Ambulatory Visit (INDEPENDENT_AMBULATORY_CARE_PROVIDER_SITE_OTHER): Payer: PRIVATE HEALTH INSURANCE | Admitting: Family Medicine

## 2019-01-03 ENCOUNTER — Encounter: Payer: Self-pay | Admitting: Family Medicine

## 2019-01-03 VITALS — BP 140/80 | HR 118 | Temp 97.1°F | Ht 71.0 in | Wt 382.2 lb

## 2019-01-03 DIAGNOSIS — M545 Low back pain, unspecified: Secondary | ICD-10-CM

## 2019-01-03 DIAGNOSIS — E669 Obesity, unspecified: Secondary | ICD-10-CM

## 2019-01-03 DIAGNOSIS — F419 Anxiety disorder, unspecified: Secondary | ICD-10-CM

## 2019-01-03 DIAGNOSIS — E1169 Type 2 diabetes mellitus with other specified complication: Secondary | ICD-10-CM

## 2019-01-03 DIAGNOSIS — Z23 Encounter for immunization: Secondary | ICD-10-CM | POA: Diagnosis not present

## 2019-01-03 DIAGNOSIS — F329 Major depressive disorder, single episode, unspecified: Secondary | ICD-10-CM

## 2019-01-03 DIAGNOSIS — G4700 Insomnia, unspecified: Secondary | ICD-10-CM

## 2019-01-03 LAB — MICROALBUMIN / CREATININE URINE RATIO
Creatinine,U: 70.9 mg/dL
Microalb Creat Ratio: 6.3 mg/g (ref 0.0–30.0)
Microalb, Ur: 4.5 mg/dL — ABNORMAL HIGH (ref 0.0–1.9)

## 2019-01-03 MED ORDER — MELOXICAM 15 MG PO TABS: 15.0000 mg | ORAL_TABLET | Freq: Every day | ORAL | 0 refills | Status: DC

## 2019-01-03 MED ORDER — DOXEPIN HCL 6 MG PO TABS: 1.0000 | ORAL_TABLET | Freq: Every evening | ORAL | 2 refills | Status: DC | PRN

## 2019-01-03 NOTE — Progress Notes (Signed)
Chief Complaint  Patient presents with  . Follow-up    Subjective: Patient is a 28 y.o. male here for f/u sleep.  Seen around 1 mo ago, started on trazodone for sleep. He is taking Zoloft for anxiety. Reports no improvements. Compliant, no AE's. Watches TV before bed, drinks diet pop in afternoon.   2 weeks of R sided low back pain. No inj or change in activity. Tries to stretch at home. Lifts a lot at work. No neurologic s/s's. Has not tried anything at home thus far.   ROS: Psych: +insomnia MSK: +back pain  Past Medical History:  Diagnosis Date  . ADD (attention deficit disorder)   . Asthma   . Bipolar 1 disorder ([place])   . Diabetes mellitus type 2 in obese ([place]) 08/02/2016  . Hypertension   . Obesity     Objective: BP 140/80 (BP Location: Left Arm, Patient Position: Sitting, Cuff Size: Large)   Pulse (!) 118   Temp (!) 97.1 F (36.2 C) (Temporal)   Ht 5\' 11"  (1.803 m)   Wt (!) 382 lb 4 oz (173.4 kg)   SpO2 98%   BMI 53.31 kg/m  General: Awake, appears stated age MSK: +ttp over lower thor and lumbar parasp msc on R, poor hamstring ROM Neuro: Gait nml, no cerebellar signs.  Lungs: No accessory muscle use Psych: Age appropriate judgment and insight, normal affect and mood  Assessment and Plan: Insomnia, unspecified type - Plan: Doxepin HCl 6 MG TABS  Anxiety and depression  Diabetes mellitus type 2 in obese ([place]) - Plan: Hemoglobin A1c, Microalbumin / creatinine urine ratio  Need for influenza vaccination - Plan: Flu Vaccine QUAD 6+ mos PF IM (Fluarix Quad PF)  Acute right-sided low back pain without sciatica - Plan: meloxicam (MOBIC) 15 MG tablet  Start Doxepin. Sleep hygiene. [facility] info given. Counseled on exercise. Stretches/exercises, heat, Mobic, letter for work given. F/u in 1 mo.  The patient voiced understanding and agreement to the plan.  [place], DO 01/03/19  1:38 PM

## 2019-01-03 NOTE — Patient Instructions (Addendum)
Stop the trazodone.   Sleep Hygiene Tips:  Do not watch TV or look at screens within 1 hour of going to bed. If you do, make sure there is a blue light filter (nighttime mode) involved.  Try to go to bed around the same time every night. Wake up at the same time within 1 hour of regular time. Ex: If you wake up at 7 AM for work, do not sleep past 8 AM on days that you don't work.  Do not drink alcohol before bedtime.  Do not consume caffeine-containing beverages after noon or within 9 hours of intended bedtime.  Get regular exercise/physical activity in your life, but not within 2 hours of planned bedtime.  Do not take naps.   Do not eat within 2 hours of planned bedtime.  Melatonin, 3-5 mg 30-60 minutes before planned bedtime may be helpful.   The bed should be for sleep or sex only. If after 20-30 minutes you are unable to fall asleep, get up and do something relaxing. Do this until you feel ready to go to sleep again.   Sleep is important to [place] all. Getting good sleep is imperative to adequate functioning during the day. Work with our counselors who are trained to help people obtain quality sleep. Call [phone] to schedule an appointment or if you are curious about insurance coverage/cost.  Aim to do some physical exertion for 150 minutes per week. This is typically divided into 5 days per week, 30 minutes per day. The activity should be enough to get your heart rate up. Anything is better than nothing if you have time constraints.  Heat (pad or rice pillow in microwave) over affected area, 10-15 minutes twice daily.   Continue the stretches.  Let us know if you need anything.

## 2019-01-04 ENCOUNTER — Encounter: Payer: Self-pay | Admitting: Family Medicine

## 2019-01-04 ENCOUNTER — Other Ambulatory Visit: Payer: Self-pay | Admitting: Family Medicine

## 2019-01-04 LAB — HEMOGLOBIN A1C: Hgb A1c MFr Bld: 8.7 % — ABNORMAL HIGH (ref 4.6–6.5)

## 2019-01-04 MED ORDER — METFORMIN HCL 500 MG PO TABS: ORAL_TABLET | ORAL | 1 refills | Status: DC

## 2019-01-05 ENCOUNTER — Ambulatory Visit: Payer: PRIVATE HEALTH INSURANCE | Admitting: Family Medicine

## 2019-01-23 ENCOUNTER — Other Ambulatory Visit: Payer: Self-pay

## 2019-01-24 ENCOUNTER — Encounter: Payer: Self-pay | Admitting: Family Medicine

## 2019-01-24 ENCOUNTER — Ambulatory Visit (INDEPENDENT_AMBULATORY_CARE_PROVIDER_SITE_OTHER): Payer: Self-pay | Admitting: Family Medicine

## 2019-01-24 ENCOUNTER — Ambulatory Visit (HOSPITAL_BASED_OUTPATIENT_CLINIC_OR_DEPARTMENT_OTHER)
Admission: RE | Admit: 2019-01-24 | Discharge: 2019-01-24 | Disposition: A | Discharge status: Home / Self Care | Payer: Self-pay | Source: Ambulatory Visit | Attending: Family Medicine | Admitting: Family Medicine

## 2019-01-24 ENCOUNTER — Other Ambulatory Visit: Payer: Self-pay

## 2019-01-24 VITALS — BP 130/88 | HR 83 | Temp 98.4°F | Ht 70.0 in

## 2019-01-24 DIAGNOSIS — M25531 Pain in right wrist: Secondary | ICD-10-CM

## 2019-01-24 DIAGNOSIS — M25561 Pain in right knee: Secondary | ICD-10-CM

## 2019-01-24 DIAGNOSIS — M545 Low back pain, unspecified: Secondary | ICD-10-CM

## 2019-01-24 MED ORDER — KETOROLAC TROMETHAMINE 60 MG/2ML IM SOLN
60.0000 mg | Freq: Once | INTRAMUSCULAR | Status: AC
  Administered 2019-01-24: 12:00:00 60 mg via INTRAMUSCULAR

## 2019-01-24 NOTE — Progress Notes (Signed)
Musculoskeletal Exam  Patient: Luis Boyer DOB: [birth year]  DOS: 01/24/2019  SUBJECTIVE:  Chief Complaint:   Chief Complaint  Patient presents with  . Fall    at work  . Back, right elbow and knee pain due to injury    Luis Boyer is a 28 y.o.  male for evaluation and treatment of wrist/knee pain.   Onset:  2 days ago. Fell off of pallet jack Location: R wrist, knee, back Character:  aching and boring  Progression of issue:  is unchanged Associated symptoms: decreased ROM in R wrist Treatment: to date has been ice, OTC NSAIDS and heat.   8/10 pain total.  Neurovascular symptoms: no No bowel/bladder incontinence  ROS: Musculoskeletal/Extremities: +knee, wrist, back pain Neuro: No weakness  Past Medical History:  Diagnosis Date  . ADD (attention deficit disorder)   . Asthma   . Bipolar 1 disorder ([place])   . Diabetes mellitus type 2 in obese ([place]) 08/02/2016  . Hypertension   . Obesity     Objective: VITAL SIGNS: BP 130/88 (BP Location: Left Arm, Patient Position: Sitting, Cuff Size: Large)   Pulse 83   Temp 98.4 F (36.9 C) (Temporal)   Ht 5\' 10"  (1.778 m)   SpO2 97%   BMI 54.85 kg/m  Constitutional: Well formed, well developed. No acute distress. Cardiovascular: Brisk cap refill Thorax & Lungs: No accessory muscle use Musculoskeletal: R wrist.   Normal active range of motion: no.   Normal passive range of motion: no Tenderness to palpation: yes over dorsal-lateral R wrist Deformity: no Ecchymosis: no Low back TTP over R lumbar parasp msc Poor hamstring rom, neg straight leg R knee No ttp, no effusion, no ecchymosis Nml ROM Neg ant/post drawer, varus/valgus stress, patellar grind/app, Stine's Neurologic: Normal sensory function. No focal deficits noted. DTR's equal and symmetric in LE's. No clonus. Psychiatric: Normal mood. Age appropriate judgment and insight. Alert & oriented x 3.    Assessment:  Right wrist pain - Plan: DG Wrist Complete  Right, ketorolac (TORADOL) injection 60 mg  Acute right-sided low back pain without sciatica - Plan: ketorolac (TORADOL) injection 60 mg  Acute pain of right knee - Plan: ketorolac (TORADOL) injection 60 mg  Plan: 1- Splint, ck XR, stretches/exercises, ice, Mobic, Tylenol. 2- Stretches/exercises, might need PT as his LE flexibility is very poor. Toradol. Other meds as above. 3- R knee should resolve spontaneously, but will provide stretches/exercises otherwise.  F/u prn. The patient voiced understanding and agreement to the plan.   [place], DO 01/24/19  12:38 PM

## 2019-01-24 NOTE — Patient Instructions (Signed)
Ice/cold pack over area for 10-15 min twice daily.  OK to take Tylenol 1000 mg (2 extra strength tabs) or 975 mg (3 regular strength tabs) every 6 hours as needed.  Use the meloxicam for pain.   Wrist and Forearm Exercises Do exercises exactly as told by your health care provider and adjust them as directed. It is normal to feel mild stretching, pulling, tightness, or discomfort as you do these exercises, but you should stop right away if you feel sudden pain or your pain gets worse.   RANGE OF MOTION EXERCISES These exercises warm up your muscles and joints and improve the movement and flexibility of your injured wrist and forearm. These exercises also help to relieve pain, numbness, and tingling. These exercises are done using the muscles in your injured wrist and forearm. Exercise A: Wrist Flexion, Active 1. With your fingers relaxed, bend your wrist forward as far as you can. 2. Hold this position for 30 seconds. Repeat 2 times. Complete this exercise 3 times per week. Exercise B: Wrist Extension, Active 1. With your fingers relaxed, bend your wrist backward as far as you can. 2. Hold this position for 30 seconds. Repeat 2 times. Complete this exercise 3 times per week. Exercise C: Supination, Active  1. Stand or sit with your arms at your sides. 2. Bend your left / right elbow to an "L" shape (90 degrees). 3. Turn your palm upward until you feel a gentle stretch on the inside of your forearm. 4. Hold this position for 30 seconds. 5. Slowly return your palm to the starting position. Repeat 2 times. Complete this exercise 3 times per week. Exercise D: Pronation, Active  1. Stand or sit with your arms at your sides. 2. Bend your left / right elbow to an "L" shape (90 degrees). 3. Turn your palm downward until you feel a gentle stretch on the top of your forearm. 4. Hold this position for 30 seconds. 5. Slowly return your palm to the starting position. Repeat 2 times. Complete  this exercise once a day.  STRETCHING EXERCISES These exercises warm up your muscles and joints and improve the movement and flexibility of your injured wrist and forearm. These exercises also help to relieve pain, numbness, and tingling. These exercises are done using your healthy wrist and forearm to help stretch the muscles in your injured wrist and forearm. Exercise E: Wrist Flexion, Passive  1. Extend your left / right arm in front of you, relax your wrist, and point your fingers downward. 2. Gently push on the back of your hand. Stop when you feel a gentle stretch on the top of your forearm. 3. Hold this position for 30 seconds. Repeat 2 times. Complete this exercise 3 times per week. Exercise F: Wrist Extension, Passive  1. Extend your left / right arm in front of you and turn your palm upward. 2. Gently pull your palm and fingertips back so your fingers point downward. You should feel a gentle stretch on the palm-side of your forearm. 3. Hold this position for 30 seconds. Repeat 2 times. Complete this exercise 3 times per week. Exercise G: Forearm Rotation, Supination, Passive 1. Sit with your left / right elbow bent to an "L" shape (90 degrees) with your forearm resting on a table. 2. Keeping your upper body and shoulder still, use your other hand to rotate your forearm palm-up until you feel a gentle to moderate stretch. 3. Hold this position for 30 seconds. 4. Slowly release the  stretch and return to the starting position. Repeat 2 times. Complete this exercise 3 times per week. Exercise H: Forearm Rotation, Pronation, Passive 1. Sit with your left / right elbow bent to an "L" shape (90 degrees) with your forearm resting on a table. 2. Keeping your upper body and shoulder still, use your other hand to rotate your forearm palm-down until you feel a gentle to moderate stretch. 3. Hold this position for 30 seconds. 4. Slowly release the stretch and return to the starting position.  Repeat 2 times. Complete this exercise 3 times per week.  STRENGTHENING EXERCISES These exercises build strength and endurance in your wrist and forearm. Endurance is the ability to use your muscles for a long time, even after they get tired. Exercise I: Wrist Flexors  1. Sit with your left / right forearm supported on a table and your hand resting palm-up over the edge of the table. Your elbow should be bent to an "L" shape (about 90 degrees) and be below the level of your shoulder. 2. Hold a 3-5 lb weight in your left / right hand. Or, hold a rubber exercise band or tube in both hands, keeping your hands at the same level and hip distance apart. There should be a slight tension in the exercise band or tube. 3. Slowly curl your hand up toward your forearm. 4. Hold this position for 3 seconds. 5. Slowly lower your hand back to the starting position. Repeat 2 times. Complete this exercise 3 times per week. Exercise J: Wrist Extensors  1. Sit with your left / right forearm supported on a table and your hand resting palm-down over the edge of the table. Your elbow should be bent to an "L" shape (about 90 degrees) and be below the level of your shoulder. 2. Hold a 3-5 lb weight in your left / right hand. Or, hold a rubber exercise band or tube in both hands, keeping your hands at the same level and hip distance apart. There should be a slight tension in the exercise band or tube. 3. Slowly curl your hand up toward your forearm. 4. Hold this position for 3 seconds. 5. Slowly lower your hand back to the starting position. Repeat 2 times. Complete this exercise 3 times per week. Exercise K: Forearm Rotation, Supination  1. Sit with your left / right forearm supported on a table and your hand resting palm-down. Your elbow should be at your side, bent to an "L" shape (about 90 degrees), and below the level of your shoulder. Keep your wrist stable and in a neutral position throughout the exercise. 2.  Gently hold a lightweight hammer with your left / right hand. 3. Without moving your elbow or wrist, slowly rotate your palm upward to a thumbs-up position. 4. Hold this position for 3 seconds. 5. Slowly return your forearm to the starting position. Repeat 2 times. Complete this exercise 3 times per week. Exercise L: Forearm Rotation, Pronation  1. Sit with your left / right forearm supported on a table and your hand resting palm-up. Your elbow should be at your side, bent to an "L" shape (about 90 degrees), and below the level of your shoulder. Keep your wrist stable. Do not allow it to move backward or forward during the exercise. 2. Gently hold a lightweight hammer with your left / right hand. 3. Without moving your elbow or wrist, slowly rotate your palm and hand upward to a thumbs-up position. 4. Hold this position for 3 seconds.  5. Slowly return your forearm to the starting position. Repeat 2 times. Complete this exercise 3 times per week. Exercise M: Grip Strengthening  1. Hold one of these items in your left / right hand: play dough, therapy putty, a dense sponge, a stress ball, or a large, rolled sock. 2. Squeeze as hard as you can without increasing pain. 3. Hold this position for 5 seconds. 4. Slowly release your grip. Repeat 2 times. Complete this exercise 3 times per week.  This information is not intended to replace advice given to you by your health care provider. Make sure you discuss any questions you have with your health care provider. Document Released: 01/06/2005 Document Revised: 11/17/2015 Document Reviewed: 11/17/2014 Elsevier Interactive Patient Education  2018 [employer].  Knee Exercises It is normal to feel mild stretching, pulling, tightness, or discomfort as you do these exercises, but you should stop right away if you feel sudden pain or your pain gets worse. STRETCHING AND RANGE OF MOTION EXERCISES  These exercises warm up your muscles and joints and  improve the movement and flexibility of your knee. These exercises also help to relieve pain, numbness, and tingling. Exercise A: Knee Extension, Prone  1. Lie on your abdomen on a bed. 2. Place your left / right knee just beyond the edge of the surface so your knee is not on the bed. You can put a towel under your left / right thigh just above your knee for comfort. 3. Relax your leg muscles and allow gravity to straighten your knee. You should feel a stretch behind your left / right knee. 4. Hold this position for 30 seconds. 5. Scoot up so your knee is supported between repetitions. Repeat 2 times. Complete this stretch 3 times per week. Exercise B: Knee Flexion, Active    1. Lie on your back with both knees straight. If this causes back discomfort, bend your left / right knee so your foot is flat on the floor. 2. Slowly slide your left / right heel back toward your buttocks until you feel a gentle stretch in the front of your knee or thigh. 3. Hold this position for 30 seconds. 4. Slowly slide your left / right heel back to the starting position. Repeat 2 times. Complete this exercise 3 times per week. Exercise C: Quadriceps, Prone    1. Lie on your abdomen on a firm surface, such as a bed or padded floor. 2. Bend your left / right knee and hold your ankle. If you cannot reach your ankle or pant leg, loop a belt around your foot and grab the belt instead. 3. Gently pull your heel toward your buttocks. Your knee should not slide out to the side. You should feel a stretch in the front of your thigh and knee. 4. Hold this position for 30 seconds. Repeat 2 times. Complete this stretch 3 times per week. Exercise D: Hamstring, Supine  1. Lie on your back. 2. Loop a belt or towel over the ball of your left / right foot. The ball of your foot is on the walking surface, right under your toes. 3. Straighten your left / right knee and slowly pull on the belt to raise your leg until you feel a  gentle stretch behind your knee. ? Do not let your left / right knee bend while you do this. ? Keep your other leg flat on the floor. 4. Hold this position for 30 seconds. Repeat 2 times. Complete this stretch 3 times  per week. STRENGTHENING EXERCISES  These exercises build strength and endurance in your knee. Endurance is the ability to use your muscles for a long time, even after they get tired. Exercise E: Quadriceps, Isometric    1. Lie on your back with your left / right leg extended and your other knee bent. Put a rolled towel or small pillow under your knee if told by your health care provider. 2. Slowly tense the muscles in the front of your left / right thigh. You should see your kneecap slide up toward your hip or see increased dimpling just above the knee. This motion will push the back of the knee toward the floor. 3. For 3 seconds, keep the muscle as tight as you can without increasing your pain. 4. Relax the muscles slowly and completely. Repeat for 10 total reps Repeat 2 ti mes. Complete this exercise 3 times per week. Exercise F: Straight Leg Raises - Quadriceps  1. Lie on your back with your left / right leg extended and your other knee bent. 2. Tense the muscles in the front of your left / right thigh. You should see your kneecap slide up or see increased dimpling just above the knee. Your thigh may even shake a bit. 3. Keep these muscles tight as you raise your leg 4-6 inches (10-15 cm) off the floor. Do not let your knee bend. 4. Hold this position for 3 seconds. 5. Keep these muscles tense as you lower your leg. 6. Relax your muscles slowly and completely after each repetition. 10 total reps. Repeat 2 times. Complete this exercise 3 times per week.  Exercise G: Hamstring Curls    If told by your health care provider, do this exercise while wearing ankle weights. Begin with 5 lb weights (optional). Then increase the weight by 1 lb (0.5 kg) increments. Do not wear  ankle weights that are more than 20 lbs to start with. 1. Lie on your abdomen with your legs straight. 2. Bend your left / right knee as far as you can without feeling pain. Keep your hips flat against the floor. 3. Hold this position for 3 seconds. 4. Slowly lower your leg to the starting position. Repeat for 10 reps.  Repeat 2 times. Complete this exercise 3 times per week. Exercise H: Squats (Quadriceps)  1. Stand in front of a table, with your feet and knees pointing straight ahead. You may rest your hands on the table for balance but not for support. 2. Slowly bend your knees and lower your hips like you are going to sit in a chair. ? Keep your weight over your heels, not over your toes. ? Keep your lower legs upright so they are parallel with the table legs. ? Do not let your hips go lower than your knees. ? Do not bend lower than told by your health care provider. ? If your knee pain increases, do not bend as low. 3. Hold the squat position for 1 second. 4. Slowly push with your legs to return to standing. Do not use your hands to pull yourself to standing. Repeat 2 times. Complete this exercise 3 times per week. Exercise I: Wall Slides (Quadriceps)    1. Lean your back against a smooth wall or door while you walk your feet out 18-24 inches (46-61 cm) from it. 2. Place your feet hip-width apart. 3. Slowly slide down the wall or door until your knees Repeat 2 times. Complete this exercise every other day. 4. Exercise  K: Straight Leg Raises - Hip Abductors  1. Lie on your side with your left / right leg in the top position. Lie so your head, shoulder, knee, and hip line up. You may bend your bottom knee to help you keep your balance. 2. Roll your hips slightly forward so your hips are stacked directly over each other and your left / right knee is facing forward. 3. Leading with your heel, lift your top leg 4-6 inches (10-15 cm). You should feel the muscles in your outer hip lifting.  ? Do not let your foot drift forward. ? Do not let your knee roll toward the ceiling. 4. Hold this position for 3 seconds. 5. Slowly return your leg to the starting position. 6. Let your muscles relax completely after each repetition. 10 total reps. Repeat 2 times. Complete this exercise 3 times per week. Exercise J: Straight Leg Raises - Hip Extensors  1. Lie on your abdomen on a firm surface. You can put a pillow under your hips if that is more comfortable. 2. Tense the muscles in your buttocks and lift your left / right leg about 4-6 inches (10-15 cm). Keep your knee straight as you lift your leg. 3. Hold this position for 3 seconds. 4. Slowly lower your leg to the starting position. 5. Let your leg relax completely after each repetition. Repeat 2 times. Complete this exercise 3 times per week. Document Released: 01/06/2005 Document Revised: 11/17/2015 Document Reviewed: 12/29/2014 Elsevier Interactive Patient Education  2017 [employer].

## 2019-02-06 ENCOUNTER — Other Ambulatory Visit: Payer: Self-pay

## 2019-02-07 ENCOUNTER — Ambulatory Visit (INDEPENDENT_AMBULATORY_CARE_PROVIDER_SITE_OTHER): Payer: BC Managed Care – PPO | Admitting: Family Medicine

## 2019-02-07 ENCOUNTER — Encounter: Payer: Self-pay | Admitting: Family Medicine

## 2019-02-07 ENCOUNTER — Other Ambulatory Visit: Payer: Self-pay

## 2019-02-07 VITALS — BP 138/86 | HR 92 | Temp 96.5°F | Ht 72.0 in | Wt 378.1 lb

## 2019-02-07 DIAGNOSIS — I10 Essential (primary) hypertension: Secondary | ICD-10-CM

## 2019-02-07 DIAGNOSIS — G4700 Insomnia, unspecified: Secondary | ICD-10-CM | POA: Diagnosis not present

## 2019-02-07 DIAGNOSIS — F419 Anxiety disorder, unspecified: Secondary | ICD-10-CM | POA: Diagnosis not present

## 2019-02-07 DIAGNOSIS — M25561 Pain in right knee: Secondary | ICD-10-CM | POA: Diagnosis not present

## 2019-02-07 DIAGNOSIS — F329 Major depressive disorder, single episode, unspecified: Secondary | ICD-10-CM

## 2019-02-07 MED ORDER — AMLODIPINE BESYLATE 5 MG PO TABS: 5.0000 mg | ORAL_TABLET | Freq: Every day | ORAL | 2 refills | Status: DC

## 2019-02-07 MED ORDER — NAPROXEN 500 MG PO TBEC: 500.0000 mg | DELAYED_RELEASE_TABLET | Freq: Two times a day (BID) | ORAL | 1 refills | Status: DC

## 2019-02-07 MED ORDER — DOXEPIN HCL 6 MG PO TABS: 1.0000 | ORAL_TABLET | Freq: Every evening | ORAL | 2 refills | Status: DC | PRN

## 2019-02-07 MED ORDER — SERTRALINE HCL 50 MG PO TABS: 50.0000 mg | ORAL_TABLET | Freq: Every day | ORAL | 2 refills | Status: DC

## 2019-02-07 NOTE — Patient Instructions (Signed)
Ice/cold pack over area for 10-15 min twice daily.  OK to take Tylenol 1000 mg (2 extra strength tabs) or 975 mg (3 regular strength tabs) every 6 hours as needed.  Continue the stretches/exercises as tolerated.  Let us know if you need anything.

## 2019-02-07 NOTE — Progress Notes (Signed)
Musculoskeletal Exam  Patient: Luis Boyer DOB: 12-Jun-1990  DOS: 02/07/2019  SUBJECTIVE:  Chief Complaint:   Chief Complaint  Patient presents with  . Follow-up    Luis Boyer is a 28 y.o.  male for evaluation and treatment of R knee pain.   Onset:  2.5 weeks ago had fallen at work. Location: R knee Character:  aching and stabbing  Progression of issue:  is unchanged Associated symptoms: pain with ambulation Treatment: to date has been rest, ice, OTC NSAIDS and home exercises.   Neurovascular symptoms: no  ROS: Musculoskeletal/Extremities: +R knee pain  Past Medical History:  Diagnosis Date  . ADD (attention deficit disorder)   . Asthma   . Bipolar 1 disorder ([place])   . Diabetes mellitus type 2 in obese ([place]) 08/02/2016  . Hypertension   . Obesity     Objective: VITAL SIGNS: BP 138/86 (BP Location: Left Arm, Patient Position: Sitting, Cuff Size: Large)   Pulse 92   Temp (!) 96.5 F (35.8 C) (Temporal)   Ht 6' (1.829 m)   Wt (!) 378 lb 2 oz (171.5 kg)   SpO2 98%   BMI 51.28 kg/m  Constitutional: Well formed, well developed. No acute distress. Cardiovascular: Brisk cap refill Thorax & Lungs: No accessory muscle use Musculoskeletal: R knee.   Normal active range of motion: yes.   Normal passive range of motion: yes Tenderness to palpation: no Deformity: no Ecchymosis: no Tests positive: None Tests negative: Lachman's, Stine's, varus/valgus Neurologic: Normal sensory function.  Psychiatric: Normal mood. Age appropriate judgment and insight. Alert & oriented x 3.    Assessment:  Right knee pain, unspecified chronicity  Essential hypertension - Plan: amLODipine (NORVASC) 5 MG tablet  Insomnia, unspecified type - Plan: Doxepin HCl 6 MG TABS  Anxiety and depression - Plan: sertraline (ZOLOFT) 50 MG tablet  Plan: Naproxen bid for knee pain, ice, con stretches/exercises. If no improvement, will consider injection vs sports med referral.  F/u in 1 mo  for Dm visit. The patient voiced understanding and agreement to the plan.   [place], DO 02/07/19  11:48 AM

## 2019-02-12 ENCOUNTER — Encounter: Payer: Self-pay | Admitting: Family Medicine

## 2019-02-13 ENCOUNTER — Other Ambulatory Visit: Payer: Self-pay | Admitting: Family Medicine

## 2019-02-13 MED ORDER — MIRTAZAPINE 30 MG PO TBDP: 30.0000 mg | ORAL_TABLET | Freq: Every day | ORAL | 3 refills | Status: DC

## 2019-02-25 ENCOUNTER — Telehealth: Payer: BC Managed Care – PPO | Admitting: Family

## 2019-02-25 DIAGNOSIS — J029 Acute pharyngitis, unspecified: Secondary | ICD-10-CM | POA: Diagnosis not present

## 2019-02-25 MED ORDER — AMOXICILLIN 500 MG PO CAPS: 500.0000 mg | ORAL_CAPSULE | Freq: Two times a day (BID) | ORAL | 0 refills | Status: DC

## 2019-02-25 NOTE — Progress Notes (Signed)

## 2019-02-28 ENCOUNTER — Ambulatory Visit: Payer: BC Managed Care – PPO | Admitting: Family Medicine

## 2019-03-14 ENCOUNTER — Encounter: Payer: Self-pay | Admitting: Family Medicine

## 2019-03-14 ENCOUNTER — Ambulatory Visit (INDEPENDENT_AMBULATORY_CARE_PROVIDER_SITE_OTHER): Payer: Self-pay | Admitting: Family Medicine

## 2019-03-14 ENCOUNTER — Other Ambulatory Visit: Payer: Self-pay

## 2019-03-14 VITALS — BP 134/84 | HR 96 | Temp 102.3°F | Ht 72.0 in | Wt 372.0 lb

## 2019-03-14 DIAGNOSIS — U071 COVID-19: Secondary | ICD-10-CM

## 2019-03-14 MED ORDER — BENZONATATE 100 MG PO CAPS: 100.0000 mg | ORAL_CAPSULE | Freq: Three times a day (TID) | ORAL | 0 refills | Status: DC | PRN

## 2019-03-14 MED ORDER — ONDANSETRON 4 MG PO TBDP: 4.0000 mg | ORAL_TABLET | Freq: Three times a day (TID) | ORAL | 0 refills | Status: DC | PRN

## 2019-03-14 MED ORDER — NAPROXEN 500 MG PO TABS: 500.0000 mg | ORAL_TABLET | Freq: Two times a day (BID) | ORAL | 0 refills | Status: DC

## 2019-03-14 NOTE — Progress Notes (Signed)
Chief Complaint  Patient presents with  . Follow-up    covid test positive and needs advisement on symptoms    Luis Boyer here for URI complaints. Due to COVID-19 pandemic, we are interacting via web portal for an electronic face-to-face visit. I verified patient's ID using 2 identifiers. Patient agreed to proceed with visit via this method. Patient is at home, I am at office. Patient and I are present for visit.   Duration: 8 days  Associated symptoms: Fever (102.4 F), sinus congestion, rhinorrhea, sore throat, wheezing, shortness of breath, myalgia and cough, vomiting, diarrhea Denies: sinus pain, ear pain/drainage Treatment to date: prednisone, OTC cold medicine Sick contacts: Yes, wife has it and gave to him  ROS:  Const: + fevers HEENT: As noted in HPI Lungs: +cough  Past Medical History:  Diagnosis Date  . ADD (attention deficit disorder)   . Asthma   . Bipolar 1 disorder ([facility])   . Diabetes mellitus type 2 in obese ([facility]) 08/02/2016  . Hypertension   . Obesity     BP 134/84 (BP Location: Right Arm, Patient Position: Sitting, Cuff Size: Normal)   Pulse 96   Temp (!) 102.3 F (39.1 C) (Oral)   Ht 6' (1.829 m)   Wt (!) 372 lb (168.7 kg)   SpO2 95%   BMI 50.45 kg/m  No conversational dyspnea Age appropriate judgment and insight Nml affect and mood  COVID-19 - Plan: ondansetron (ZOFRAN-ODT) 4 MG disintegrating tablet, benzonatate (TESSALON) 100 MG capsule, naproxen (NAPROSYN) 500 MG tablet  Orders as above. Tylenol also.  Continue to push fluids, practice good hand hygiene, cover mouth when coughing. F/u prn. Discussed what scenarios to seek immediate care.  Letter for work written and release over [employer].  Pt voiced understanding and agreement to the plan.  Jilda Roche [place], DO 03/14/19 11:13 AM

## 2019-03-19 ENCOUNTER — Encounter: Payer: Self-pay | Admitting: Family Medicine

## 2019-03-19 MED ORDER — METFORMIN HCL 500 MG PO TABS: ORAL_TABLET | ORAL | 1 refills | Status: DC

## 2019-05-07 ENCOUNTER — Other Ambulatory Visit: Payer: Self-pay

## 2019-05-08 ENCOUNTER — Other Ambulatory Visit: Payer: Self-pay

## 2019-05-08 ENCOUNTER — Ambulatory Visit (INDEPENDENT_AMBULATORY_CARE_PROVIDER_SITE_OTHER): Payer: Self-pay | Admitting: Family Medicine

## 2019-05-08 ENCOUNTER — Encounter: Payer: Self-pay | Admitting: Family Medicine

## 2019-05-08 ENCOUNTER — Other Ambulatory Visit: Payer: Self-pay | Admitting: Family Medicine

## 2019-05-08 VITALS — BP 148/100 | HR 93 | Temp 95.0°F | Ht 72.0 in | Wt 389.1 lb

## 2019-05-08 DIAGNOSIS — F419 Anxiety disorder, unspecified: Secondary | ICD-10-CM

## 2019-05-08 DIAGNOSIS — M25561 Pain in right knee: Secondary | ICD-10-CM

## 2019-05-08 DIAGNOSIS — E669 Obesity, unspecified: Secondary | ICD-10-CM

## 2019-05-08 DIAGNOSIS — F32A Depression, unspecified: Secondary | ICD-10-CM

## 2019-05-08 DIAGNOSIS — I10 Essential (primary) hypertension: Secondary | ICD-10-CM

## 2019-05-08 DIAGNOSIS — E1169 Type 2 diabetes mellitus with other specified complication: Secondary | ICD-10-CM

## 2019-05-08 DIAGNOSIS — F329 Major depressive disorder, single episode, unspecified: Secondary | ICD-10-CM

## 2019-05-08 LAB — BASIC METABOLIC PANEL
BUN: 12 mg/dL (ref 6–23)
CO2: 26 mEq/L (ref 19–32)
Calcium: 9.8 mg/dL (ref 8.4–10.5)
Chloride: 101 mEq/L (ref 96–112)
Creatinine, Ser: 0.7 mg/dL (ref 0.40–1.50)
GFR: 132.87 mL/min (ref >60.00)
Glucose, Bld: 168 mg/dL — ABNORMAL HIGH (ref 70–99)
Potassium: 4.3 mEq/L (ref 3.5–5.1)
Sodium: 137 mEq/L (ref 135–145)

## 2019-05-08 LAB — HEMOGLOBIN A1C: Hgb A1c MFr Bld: 8.1 % — ABNORMAL HIGH (ref 4.6–6.5)

## 2019-05-08 MED ORDER — HYDROCHLOROTHIAZIDE 25 MG PO TABS: 25.0000 mg | ORAL_TABLET | Freq: Every day | ORAL | 3 refills | Status: DC

## 2019-05-08 MED ORDER — METFORMIN HCL 500 MG PO TABS: 1000.0000 mg | ORAL_TABLET | Freq: Two times a day (BID) | ORAL | 2 refills | Status: DC

## 2019-05-08 MED ORDER — SERTRALINE HCL 100 MG PO TABS: 100.0000 mg | ORAL_TABLET | Freq: Every day | ORAL | 3 refills | Status: DC

## 2019-05-08 MED ORDER — DAPAGLIFLOZIN PROPANEDIOL 10 MG PO TABS: 10.0000 mg | ORAL_TABLET | Freq: Every day | ORAL | 3 refills | Status: DC

## 2019-05-08 NOTE — Progress Notes (Signed)
Subjective:  CC: DM  Luis Boyer is a 29 y.o. male here for follow-up of diabetes.   Luis Boyer's self monitored glucose range is mid 100's. Patient denies hypoglycemic reactions. He checks his glucose levels 1-2 times per week. Patient does not require insulin.   Medications include: metformin 1000 mg bid Diet has been poor Exercise: walking  Hypertension Patient presents for hypertension follow up. He does not monitor home blood pressures. He is compliant with medication- Norvasc 5 mg/d- had angioedema w ACEi. Patient has these side effects of medication: none Diet and exercise as above.   Depression with anxiety Taking Zoloft 50 mg/d, doing OK, worse over past 1 mo due to stress at work. No HI or SI.  Not following w counselor or psychologist.  No SI or HI. No self medication.  R knee pain This is unimproved over the past several months.  No recent injury or change in activity.  Has been doing stretches and exercises at home.  Has failed several anti-inflammatories.  Past Medical History:  Diagnosis Date  . ADD (attention deficit disorder)   . Asthma   . Bipolar 1 disorder ([facility])   . Diabetes mellitus type 2 in obese ([facility]) 08/02/2016  . Hypertension   . Obesity      Related testing: Date of retinal exam: Due Pneumovax: done Flu Shot: done  Review of Systems: Pulmonary:  No SOB Cardiovascular:  No chest pain  Objective:  BP (!) 148/100 (BP Location: Right Arm, Patient Position: Sitting, Cuff Size: Large)   Pulse 93   Temp (!) 95 F (35 C) (Temporal)   Ht 6' (1.829 m)   Wt (!) 389 lb 2 oz (176.5 kg)   SpO2 99%   BMI 52.77 kg/m  General:  Well developed, well nourished, in no apparent distress Skin:  Warm, no pallor or diaphoresis Head:  Normocephalic, atraumatic Eyes:  Pupils equal and round, sclera anicteric without injection  Lungs:  CTAB, no access msc use Cardio:  RRR, no bruits, no LE edema Psych: Age appropriate judgment and insight  Assessment:    Diabetes mellitus type 2 in obese ([facility]) - Plan: metFORMIN (GLUCOPHAGE) 500 MG tablet, Hemoglobin A1c, Ambulatory referral to Ophthalmology  Anxiety and depression - Plan: sertraline (ZOLOFT) 100 MG tablet  Right knee pain, unspecified chronicity - Plan: Ambulatory referral to Sports Medicine  Essential hypertension - Plan: Basic metabolic panel, hydrochlorothiazide (HYDRODIURIL) 25 MG tablet  Morbid obesity ([facility])   Plan:   Orders as above.  If sugars not controlled, will add Actos versus Marcelline Deist based solely on cost. Counseled on diet and exercise.  He needs to get better with this. Increase Zoloft from 50 mg daily to 100 mg daily. Refer to sports medicine for further evaluation. Add hydrochlorothiazide to amlodipine. F/u in 1 mo to reck BP. The patient voiced understanding and agreement to the plan.  Jilda Roche [place], DO 05/08/19 11:54 AM

## 2019-05-08 NOTE — Patient Instructions (Signed)
Give us 2-3 business days to get the results of your labs back.   Keep the diet clean and stay active.  If you do not hear anything about your referral in the next 1-2 weeks, call our office and ask for an update.  Let us know if you need anything. 

## 2019-05-09 ENCOUNTER — Encounter: Payer: Self-pay | Admitting: Family Medicine

## 2019-05-09 MED ORDER — PIOGLITAZONE HCL 30 MG PO TABS: 30.0000 mg | ORAL_TABLET | Freq: Every day | ORAL | 3 refills | Status: DC

## 2019-05-11 ENCOUNTER — Ambulatory Visit: Payer: Self-pay | Admitting: Family Medicine

## 2019-05-27 ENCOUNTER — Encounter: Payer: Self-pay | Admitting: Family Medicine

## 2019-06-04 ENCOUNTER — Other Ambulatory Visit: Payer: Self-pay

## 2019-06-05 ENCOUNTER — Ambulatory Visit (INDEPENDENT_AMBULATORY_CARE_PROVIDER_SITE_OTHER): Payer: Self-pay | Admitting: Family Medicine

## 2019-06-05 ENCOUNTER — Other Ambulatory Visit: Payer: Self-pay

## 2019-06-05 ENCOUNTER — Encounter: Payer: Self-pay | Admitting: Family Medicine

## 2019-06-05 VITALS — BP 140/100 | HR 85 | Temp 95.1°F | Ht 72.0 in | Wt 386.4 lb

## 2019-06-05 DIAGNOSIS — M545 Low back pain, unspecified: Secondary | ICD-10-CM

## 2019-06-05 DIAGNOSIS — I10 Essential (primary) hypertension: Secondary | ICD-10-CM

## 2019-06-05 LAB — BASIC METABOLIC PANEL
BUN: 13 mg/dL (ref 6–23)
CO2: 25 mEq/L (ref 19–32)
Calcium: 9.7 mg/dL (ref 8.4–10.5)
Chloride: 103 mEq/L (ref 96–112)
Creatinine, Ser: 0.63 mg/dL (ref 0.40–1.50)
GFR: 149.97 mL/min (ref >60.00)
Glucose, Bld: 128 mg/dL — ABNORMAL HIGH (ref 70–99)
Potassium: 4.1 mEq/L (ref 3.5–5.1)
Sodium: 136 mEq/L (ref 135–145)

## 2019-06-05 LAB — TSH: TSH: 0.47 u[IU]/mL (ref 0.35–4.50)

## 2019-06-05 MED ORDER — CHLORTHALIDONE 25 MG PO TABS: 25.0000 mg | ORAL_TABLET | Freq: Every day | ORAL | 3 refills | Status: DC

## 2019-06-05 NOTE — Patient Instructions (Addendum)
Check blood pressure at home.  Keep the diet clean and stay active.  Give [place] 2-3 business days to get the results of your labs back.   Let me know if the medicine is too expensive. Stay hydrated. Cancel the lab appointment if we do not end up getting this new BP med due to cost.   Let us know if you need anything.

## 2019-06-05 NOTE — Progress Notes (Signed)
Chief Complaint  Patient presents with  . Follow-up    Subjective Luis Boyer is a 29 y.o. male who presents for hypertension follow up. He does monitor home blood pressures. Blood pressures ranging from 140's/90-100's on average. He is compliant with medications- Norvasc 5 mg/d, HCTZ 25 mg/d. Patient has these side effects of medication: HCTZ caused hair loss over his beard He is moreso adhering to a healthy diet overall. Current exercise: walking  Strained L back again. Doing stretches, Tylenol, rest. No neurologic s/s's.    Past Medical History:  Diagnosis Date  . ADD (attention deficit disorder)   . Asthma   . Bipolar 1 disorder ([facility])   . Diabetes mellitus type 2 in obese ([facility]) 08/02/2016  . Hypertension   . Obesity     Review of Systems Cardiovascular: no chest pain Respiratory:  no shortness of breath  Exam BP (!) 140/100 (BP Location: Left Arm, Patient Position: Sitting, Cuff Size: Large)   Pulse 85   Temp (!) 95.1 F (35.1 C) (Temporal)   Ht 6' (1.829 m)   Wt (!) 386 lb 6 oz (175.3 kg)   SpO2 98%   BMI 52.40 kg/m  General:  well developed, well nourished, in no apparent distress Heart: RRR, no bruits, no LE edema Lungs: clear to auscultation, no accessory muscle use MSK: Tenderness to palpation over the left lumbar paraspinal musculature Psych: well oriented with normal range of affect and appropriate judgment/insight  Essential hypertension - Plan: TSH, Basic metabolic panel, Basic metabolic panel  Acute right-sided low back pain without sciatica  1- Stop HCTZ. Start chlorthalidone. BMP today and in 1 week. Counseled on diet and exercise. Cont ck'ing BP at home. 2- letter for work given. Needs to lose weight. Stretches.  F/u in 1 mo. The patient voiced understanding and agreement to the plan.  Jilda Roche [place], DO 06/05/19  12:00 PM

## 2019-06-06 ENCOUNTER — Encounter: Payer: Self-pay | Admitting: Family Medicine

## 2019-06-26 ENCOUNTER — Other Ambulatory Visit: Payer: Self-pay

## 2019-07-03 ENCOUNTER — Ambulatory Visit (INDEPENDENT_AMBULATORY_CARE_PROVIDER_SITE_OTHER): Payer: Self-pay | Admitting: Family Medicine

## 2019-07-03 ENCOUNTER — Encounter: Payer: Self-pay | Admitting: Family Medicine

## 2019-07-03 ENCOUNTER — Other Ambulatory Visit: Payer: Self-pay

## 2019-07-03 VITALS — BP 134/90 | HR 98 | Temp 95.8°F | Ht 72.0 in | Wt 387.0 lb

## 2019-07-03 DIAGNOSIS — I10 Essential (primary) hypertension: Secondary | ICD-10-CM

## 2019-07-03 DIAGNOSIS — M549 Dorsalgia, unspecified: Secondary | ICD-10-CM

## 2019-07-03 MED ORDER — CARVEDILOL 12.5 MG PO TABS: 12.5000 mg | ORAL_TABLET | Freq: Two times a day (BID) | ORAL | 3 refills | Status: DC

## 2019-07-03 NOTE — Patient Instructions (Signed)
Keep the diet clean and stay active.  Keep checking your blood pressures.  Give [place] 2-3 business days to get the results of your labs back.   Goal weight in 6 weeks: 375-380 lbs.  Let us know if you need anything.

## 2019-07-03 NOTE — Progress Notes (Signed)
Chief Complaint  Patient presents with  . Follow-up    Subjective Luis Boyer is a 29 y.o. male who presents for hypertension follow up. He does monitor home blood pressures. Blood pressures ranging from 140's/90-100's on average.  He is compliant with medications- Norvasc 5 mg/d, chlorthalidone 25 mg/d. Patient has these side effects of medication: none He is usually adhering to a healthy diet overall. Current exercise: walking  L upper back pain continues. Stretches/exercises help. Work is physical and he appears to re-aggravate things.    Past Medical History:  Diagnosis Date  . ADD (attention deficit disorder)   . Asthma   . Bipolar 1 disorder (HCC)   . Diabetes mellitus type 2 in obese (HCC) 08/02/2016  . Hypertension   . Obesity     Exam BP 134/90 (BP Location: Left Arm, Patient Position: Sitting, Cuff Size: Large)   Pulse 98   Temp (!) 95.8 F (35.4 C) (Temporal)   Ht 6' (1.829 m)   Wt (!) 387 lb (175.5 kg)   SpO2 97%   BMI 52.49 kg/m  General:  well developed, well nourished, in no apparent distress Heart: RRR, no bruits, 1+ pitting LE edema Lungs: clear to auscultation, no accessory muscle use MSK: +TTP in upper rhomboid/trap area. Normal ROM of LUE Psych: well oriented with normal range of affect and appropriate judgment/insight  Essential hypertension - Plan: carvedilol (COREG) 12.5 MG tablet, Basic metabolic panel  Upper back pain on left side  Cont CCB and thiazide diuretic. +angioedema to ACEi's, will avoid these and arbs.  Counseled on diet and exercise. Cont stretches/exercises for upper back. If no continued improvement, PT.  Goal weight in 6 weeks: 375-380 lbs F/u in 6 weeks to reck BP.  The patient voiced understanding and agreement to the plan.  Jilda Roche [place], DO 07/03/19  3:26 PM

## 2019-07-04 LAB — BASIC METABOLIC PANEL
BUN: 7 mg/dL (ref 6–23)
CO2: 26 mEq/L (ref 19–32)
Calcium: 9.3 mg/dL (ref 8.4–10.5)
Chloride: 104 mEq/L (ref 96–112)
Creatinine, Ser: 0.66 mg/dL (ref 0.40–1.50)
GFR: 142.06 mL/min (ref >60.00)
Glucose, Bld: 175 mg/dL — ABNORMAL HIGH (ref 70–99)
Potassium: 3.9 mEq/L (ref 3.5–5.1)
Sodium: 140 mEq/L (ref 135–145)

## 2019-07-17 ENCOUNTER — Telehealth: Payer: Self-pay | Admitting: Physician Assistant

## 2019-07-17 DIAGNOSIS — H60501 Unspecified acute noninfective otitis externa, right ear: Secondary | ICD-10-CM

## 2019-07-17 MED ORDER — NEOMYCIN-POLYMYXIN-HC 3.5-10000-1 OT SOLN: 3.0000 [drp] | Freq: Four times a day (QID) | OTIC | 0 refills | Status: AC

## 2019-07-17 NOTE — Progress Notes (Signed)
E Visit for Swimmer's Ear  We are sorry that you are not feeling well. Here is how we plan to help!  If you are not improving over the next few days with the following treatment plan, or if your symptoms continue to worsen, follow-up with Dr.Wendling or go to one of our Urgent Care centers.  You will need a physical examination of your ear.   Based on what you have shared with me it looks like you have swimmers ear. Swimmer's ear is a redness or swelling, irritation, or infection of your outer ear canal.  These symptoms usually occur within a few days of swimming.  Your ear canal is a tube that goes from the opening of the ear to the eardrum.  When water stays in your ear canal, germs can grow.  This is a painful condition that often happens to children and swimmers of all ages.  It is not contagious and oral antibiotics are not required to treat uncomplicated swimmer's ear.  The usual symptoms include: Itching inside the ear, Redness or a sense of swelling in the ear, Pain when the ear is tugged on when pressure is placed on the ear, Pus draining from the infected ear. and I have prescribed: Neomycin 0.35%, polymyxin B 10,000 units/mL, and hydrocortisone 0,5% otic solution 4 drops in affected ears four times a day for 7 days    In certain cases swimmer's ear may progress to a more serious bacterial infection of the middle or inner ear.  If you have a fever 102 and up and significantly worsening symptoms, this could indicate a more serious infection moving to the middle/inner and needs face to face evaluation in an office by a provider.  Your symptoms should improve over the next 3 days and should resolve in about 7 days.  HOME CARE:   Wash your hands frequently.  Do not place the tip of the bottle on your ear or touch it with your fingers.  You can take Acetominophen 650 mg every 4-6 hours as needed for pain.  If pain is severe or moderate, you can apply a heating pad (set on low) or hot water  bottle (wrapped in a towel) to outer ear for 20 minutes.  This will also increase drainage.  Avoid ear plugs  Do not use Q-tips  After showers, help the water run out by tilting your head to one side.  GET HELP RIGHT AWAY IF:   Fever is over 102.2 degrees.  You develop progressive ear pain or hearing loss.  Ear symptoms persist longer than 3 days after treatment.  MAKE SURE YOU:   Understand these instructions.  Will watch your condition.  Will get help right away if you are not doing well or get worse.  TO PREVENT SWIMMER'S EAR:  Use a bathing cap or custom fitted swim molds to keep your ears dry.  Towel off after swimming to dry your ears.  Tilt your head or pull your earlobes to allow the water to escape your ear canal.  If there is still water in your ears, consider using a hairdryer on the lowest setting.  Thank you for choosing an e-visit. Your e-visit answers were reviewed by a board certified advanced clinical practitioner to complete your personal care plan. Depending upon the condition, your plan could have included both over the counter or prescription medications. Please review your pharmacy choice. Be sure that the pharmacy you have chosen is open so that you can pick up your  prescription now.  If there is a problem you may message your provider in MyChart to have the prescription routed to another pharmacy. Your safety is important to [place]. If you have drug allergies check your prescription carefully.  For the next 24 hours, you can use MyChart to ask questions about today's visit, request a non-urgent call back, or ask for a work or school excuse from your e-visit provider. You will get an email in the next two days asking about your experience. I hope that your e-visit has been valuable and will speed your recovery.    Greater than 5 minutes, yet less than 10 minutes of time have been spent researching, coordinating and implementing care for this patient  today.

## 2019-08-14 ENCOUNTER — Ambulatory Visit (INDEPENDENT_AMBULATORY_CARE_PROVIDER_SITE_OTHER): Payer: BC Managed Care – PPO | Admitting: Family Medicine

## 2019-08-14 ENCOUNTER — Other Ambulatory Visit: Payer: Self-pay

## 2019-08-14 ENCOUNTER — Encounter: Payer: Self-pay | Admitting: Family Medicine

## 2019-08-14 VITALS — BP 140/92 | HR 90 | Temp 95.9°F | Ht 71.0 in | Wt 387.0 lb

## 2019-08-14 DIAGNOSIS — G4700 Insomnia, unspecified: Secondary | ICD-10-CM

## 2019-08-14 DIAGNOSIS — I10 Essential (primary) hypertension: Secondary | ICD-10-CM | POA: Diagnosis not present

## 2019-08-14 DIAGNOSIS — R0683 Snoring: Secondary | ICD-10-CM

## 2019-08-14 MED ORDER — CARVEDILOL 25 MG PO TABS: 25.0000 mg | ORAL_TABLET | Freq: Two times a day (BID) | ORAL | 3 refills | Status: DC

## 2019-08-14 MED ORDER — AMITRIPTYLINE HCL 25 MG PO TABS: 25.0000 mg | ORAL_TABLET | Freq: Every day | ORAL | 2 refills | Status: DC

## 2019-08-14 NOTE — Patient Instructions (Signed)
Keep the diet clean and stay active.  Continue to monitor your blood pressure at home.  Options: 1- Nutritionist; this isn't always covered by insurance 2- Medical weight loss management; non-surgical, some out of pocket cost, you see them every 2 weeks, can take ~3 months to get in 3- bariatric surgery; does have data for working, but this can be pricey depending on your plan and there are "hoops" to jump through. Plus it is surgery.  If you do not hear anything about your referral in the next 1-2 weeks, call our office and ask for an update.  Let us know if you need anything.

## 2019-08-14 NOTE — Progress Notes (Signed)
Chief Complaint  Patient presents with  . Follow-up    Subjective Luis Boyer is a 29 y.o. male who presents for hypertension follow up. He does monitor home blood pressures. Blood pressures ranging from 130-140's/80-90's on average. He is compliant with medication- Coreg 12.5 mg bid, chlorthalidone 25 mg/d, Norvasc 5 mg/d. Patient has these side effects of medication: none He is sometimes adhering to a healthy diet overall. Current exercise: walking  +insomnia. Remeron did not work as well. Took his brother's Ambien and it worked. He does snore. Unsure if he stops breathing. +fatigue.    Past Medical History:  Diagnosis Date  . ADD (attention deficit disorder)   . Asthma   . Bipolar 1 disorder (HCC)   . Diabetes mellitus type 2 in obese (HCC) 08/02/2016  . Hypertension   . Obesity     Review of Systems Cardiovascular: no chest pain Respiratory:  no shortness of breath  Exam BP (!) 140/92 (BP Location: Left Arm, Patient Position: Sitting, Cuff Size: Large)   Pulse 90   Temp (!) 95.9 F (35.5 C) (Temporal)   Ht 5\' 11"  (1.803 m)   Wt (!) 387 lb (175.5 kg)   SpO2 98%   BMI 53.98 kg/m  General:  well developed, well nourished, in no apparent distress Heart: RRR, no bruits, no LE edema Lungs: clear to auscultation, no accessory muscle use Psych: well oriented with normal range of affect and appropriate judgment/insight  Essential hypertension - Plan: carvedilol (COREG) 25 MG tablet  Insomnia, unspecified type - Plan: Ambulatory referral to Pulmonology, amitriptyline (ELAVIL) 25 MG tablet  Snoring - Plan: Ambulatory referral to Pulmonology  Increase Coreg to 25 mg bid. Counseled on diet and exercise. Offered nutritionist, MWM, bariatric surgery. He will speak with his wife and decide. he needs to lose wt.  Refer pulm/sleep. Elavil. I will not rx Ambien long term.  F/u in 6 weeks to reck. The patient voiced understanding and agreement to the plan.   [place], DO 08/14/19  1:28 PM

## 2019-08-17 ENCOUNTER — Telehealth: Payer: BC Managed Care – PPO | Admitting: Physician Assistant

## 2019-08-17 DIAGNOSIS — M545 Low back pain, unspecified: Secondary | ICD-10-CM

## 2019-08-17 MED ORDER — BACLOFEN 10 MG PO TABS: 10.0000 mg | ORAL_TABLET | Freq: Three times a day (TID) | ORAL | 0 refills | Status: DC

## 2019-08-17 NOTE — Progress Notes (Signed)
Hi Branden,   I am sorry that you are not feeling well.  I see back pain has been a problem for you in the past.  I will treat you today, but you will need to follow up with your PCP for this problem to discuss a Physical Therapy referral.   If you are not improving or if your symptoms worsen, please go to the Emergency Department.     Based on what you have shared with me it looks like you mostly have acute back pain.  Acute back pain is defined as musculoskeletal pain that can resolve in 1-3 weeks with conservative treatment.  I have prescribed Baclofen 10 mg every eight hours as needed which is a muscle relaxer  Please keep in mind that muscle relaxer's can cause fatigue and should not be taken while at work or driving.  Back pain is very common.  The pain often gets better over time.  The cause of back pain is usually not dangerous.  Most people can learn to manage their back pain on their own.  Home Care  Stay active.  Start with short walks on flat ground if you can.  Try to walk farther each day.  Do not sit, drive or stand in one place for more than 30 minutes.  Do not stay in bed.  Do not avoid exercise or work.  Activity can help your back heal faster.  Be careful when you bend or lift an object.  Bend at your knees, keep the object close to you, and do not twist.  Sleep on a firm mattress.  Lie on your side, and bend your knees.  If you lie on your back, put a pillow under your knees.  Only take medicines as told by your doctor.  Put ice on the injured area.  Put ice in a plastic bag  Place a towel between your skin and the bag  Leave the ice on for 15-20 minutes, 3-4 times a day for the first 2-3 days. 210 After that, you can switch between ice and heat packs.  Ask your doctor about back exercises or massage.  Avoid feeling anxious or stressed.  Find good ways to deal with stress, such as exercise.  Get Help Right Way If:  Your pain does not go away with rest or  medicine.  Your pain does not go away in 1 week.  You have new problems.  You do not feel well.  The pain spreads into your legs.  You cannot control when you poop (bowel movement) or pee (urinate)  You feel sick to your stomach (nauseous) or throw up (vomit)  You have belly (abdominal) pain.  You feel like you may pass out (faint).  If you develop a fever.  Make Sure you:  Understand these instructions.  Will watch your condition  Will get help right away if you are not doing well or get worse.  Your e-visit answers were reviewed by a board certified advanced clinical practitioner to complete your personal care plan.  Depending on the condition, your plan could have included both over the counter or prescription medications.  If there is a problem please reply  once you have received a response from your provider.  Your safety is important to [place].  If you have drug allergies check your prescription carefully.    You can use MyChart to ask questions about today's visit, request a non-urgent call back, or ask for a work or school excuse  for 24 hours related to this e-Visit. If it has been greater than 24 hours you will need to follow up with your provider, or enter a new e-Visit to address those concerns.  You will get an e-mail in the next two days asking about your experience.  I hope that your e-visit has been valuable and will speed your recovery. Thank you for using e-visits.  Greater than 5 minutes, yet less than 10 minutes of time have been spent researching, coordinating and implementing care for this patient today.

## 2019-08-27 ENCOUNTER — Ambulatory Visit: Payer: BC Managed Care – PPO | Admitting: Family Medicine

## 2019-09-25 ENCOUNTER — Ambulatory Visit: Payer: BC Managed Care – PPO | Admitting: Family Medicine

## 2020-01-07 ENCOUNTER — Encounter: Payer: Self-pay | Admitting: Family Medicine

## 2020-01-07 ENCOUNTER — Ambulatory Visit: Payer: BC Managed Care – PPO | Admitting: Family Medicine

## 2020-01-07 ENCOUNTER — Other Ambulatory Visit: Payer: Self-pay

## 2020-01-07 ENCOUNTER — Ambulatory Visit (INDEPENDENT_AMBULATORY_CARE_PROVIDER_SITE_OTHER): Payer: BC Managed Care – PPO | Admitting: Family Medicine

## 2020-01-07 VITALS — BP 136/86 | HR 92 | Temp 98.5°F | Ht 72.0 in | Wt 380.4 lb

## 2020-01-07 DIAGNOSIS — F319 Bipolar disorder, unspecified: Secondary | ICD-10-CM | POA: Diagnosis not present

## 2020-01-07 DIAGNOSIS — E1169 Type 2 diabetes mellitus with other specified complication: Secondary | ICD-10-CM

## 2020-01-07 DIAGNOSIS — Z23 Encounter for immunization: Secondary | ICD-10-CM

## 2020-01-07 DIAGNOSIS — F411 Generalized anxiety disorder: Secondary | ICD-10-CM

## 2020-01-07 DIAGNOSIS — E669 Obesity, unspecified: Secondary | ICD-10-CM

## 2020-01-07 DIAGNOSIS — F324 Major depressive disorder, single episode, in partial remission: Secondary | ICD-10-CM

## 2020-01-07 MED ORDER — METFORMIN HCL 500 MG PO TABS: 1000.0000 mg | ORAL_TABLET | Freq: Two times a day (BID) | ORAL | 2 refills | Status: DC

## 2020-01-07 MED ORDER — ARIPIPRAZOLE 2 MG PO TABS: 2.0000 mg | ORAL_TABLET | Freq: Every day | ORAL | 3 refills | Status: DC

## 2020-01-07 NOTE — Progress Notes (Signed)
Subjective:   Chief Complaint  Patient presents with  . Follow-up  . Panic Attack    Luis Boyer is a 29 y.o. male here for follow-up of diabetes.   Haskel's self monitored glucose range is low 100's.  Patient denies hypoglycemic reactions. He checks his glucose levels 3 time(s) per week. Patient does not require insulin.   Medications include: Metformin 1000 mg twice daily, Actos 30 mg daily Diet is getting better.  Exercise: walking  Patient has a history of bipolar 1 disorder.  He is not following with a psychiatrist down here.  He was on Zoloft as his bipolar father did well on this.  He did not notice much improvement.  He still having panic attacks 3-4 times per week.  He is not following with a counselor or psychologist.  No thoughts of harming himself or others.  No self-medication.  Past Medical History:  Diagnosis Date  . ADD (attention deficit disorder)   . Asthma   . Bipolar 1 disorder (HCC)   . Diabetes mellitus type 2 in obese (HCC) 08/02/2016  . Hypertension   . Obesity      Related testing: Retinal exam: Due Pneumovax: done  Objective:  BP 136/86 (BP Location: Left Arm, Patient Position: Sitting, Cuff Size: Large)   Pulse 92   Temp 98.5 F (36.9 C) (Oral)   Ht 6' (1.829 m)   Wt (!) 380 lb 6 oz (172.5 kg)   SpO2 98%   BMI 51.59 kg/m  General:  Well developed, well nourished, in no apparent distress Skin:  Warm, no pallor or diaphoresis Head:  Normocephalic, atraumatic Eyes:  Pupils equal and round, sclera anicteric without injection  Lungs:  CTAB, no access msc use Cardio:  RRR, no bruits, no LE edema Musculoskeletal:  Symmetrical muscle groups noted without atrophy or deformity Neuro:  Sensation intact to pinprick on feet Psych: Age appropriate judgment and insight  Assessment:   Diabetes mellitus type 2 in obese (HCC) - Plan: metFORMIN (GLUCOPHAGE) 500 MG tablet, Microalbumin / creatinine urine ratio, Hemoglobin A1c, Lipid panel, Comprehensive  metabolic panel  GAD (generalized anxiety disorder) - Plan: Ambulatory referral to Psychiatry, ARIPiprazole (ABILIFY) 2 MG tablet  Depression, major, single episode, in partial remission (HCC) - Plan: Ambulatory referral to Psychiatry, ARIPiprazole (ABILIFY) 2 MG tablet  Bipolar affective disorder, remission status unspecified (HCC) - Plan: Ambulatory referral to Psychiatry, ARIPiprazole (ABILIFY) 2 MG tablet  Need for influenza vaccination - Plan: Flu Vaccine QUAD 6+ mos PF IM (Fluarix Quad PF)   Plan:   1.  Goal A1c is less than 7.  He is not currently controlled.  Continue Metformin 1000 mg twice daily and Actos 30 mg daily for now.  We will see if we can add a medicine like Ozempic or [place] if insurance will allow should he not be controlled.  Counseled on diet and exercise.  Continue to monitor sugars. 2/3/4-start Abilify 2 mg daily.  Counseling information provided.  Referral to psychiatry in addition to providing psychiatry resources for self referral. F/u in 3-6 mo. The patient voiced understanding and agreement to the plan.  Jilda Roche [place], DO 01/07/20 10:32 AM

## 2020-01-07 NOTE — Patient Instructions (Signed)
Call your eye doctor.  Give [place] 2-3 business days to get the results of your labs back.   Keep the diet clean and stay active.  Strong work with your weight loss.  If you do not hear anything about your referral in the next 1-2 weeks, call our office and ask for an update.  Please consider counseling. Contact [phone] to schedule an appointment or inquire about cost/insurance coverage.  Let us know if you need anything.  [facility] [address] [place], [place] [address] [phone]  [facility] [address] [place], [place] [address] [phone]  [facility] [address] [place], [place] [address] [phone]  [facility] [address], [place], [place], #[phone] [address], [place], [place], #[phone]  [facility] [address] [place], [place] [address] [phone]  [facility] [address] [place], [place] [phone]  [facility] [address] [place], [place] [phone]  Call one of these offices sooner than later as it can take 2-3 months to get a new patient appointment.

## 2020-01-08 ENCOUNTER — Ambulatory Visit: Payer: BC Managed Care – PPO | Admitting: Family Medicine

## 2020-01-08 ENCOUNTER — Other Ambulatory Visit: Payer: Self-pay | Admitting: Family Medicine

## 2020-01-08 LAB — COMPREHENSIVE METABOLIC PANEL
AG Ratio: 1.6 (calc) (ref 1.0–2.5)
ALT: 34 U/L (ref 9–46)
AST: 23 U/L (ref 10–40)
Albumin: 4.5 g/dL (ref 3.6–5.1)
Alkaline phosphatase (APISO): 82 U/L (ref 36–130)
BUN: 10 mg/dL (ref 7–25)
CO2: 22 mmol/L (ref 20–32)
Calcium: 9.7 mg/dL (ref 8.6–10.3)
Chloride: 104 mmol/L (ref 98–110)
Creat: 0.73 mg/dL (ref 0.60–1.35)
Globulin: 2.8 g/dL (calc) (ref 1.9–3.7)
Glucose, Bld: 176 mg/dL — ABNORMAL HIGH (ref 65–99)
Potassium: 4.3 mmol/L (ref 3.5–5.3)
Sodium: 137 mmol/L (ref 135–146)
Total Bilirubin: 0.4 mg/dL (ref 0.2–1.2)
Total Protein: 7.3 g/dL (ref 6.1–8.1)

## 2020-01-08 LAB — LIPID PANEL
Cholesterol: 168 mg/dL (ref <200)
HDL: 38 mg/dL — ABNORMAL LOW (ref >=40)
LDL Cholesterol (Calc): 101 mg/dL (calc) — ABNORMAL HIGH
Non-HDL Cholesterol (Calc): 130 mg/dL (calc) — ABNORMAL HIGH (ref <130)
Total CHOL/HDL Ratio: 4.4 (calc) (ref <5.0)
Triglycerides: 177 mg/dL — ABNORMAL HIGH (ref <150)

## 2020-01-08 LAB — MICROALBUMIN / CREATININE URINE RATIO
Creatinine, Urine: 92 mg/dL (ref 20–320)
Microalb Creat Ratio: 16 mcg/mg creat (ref <30)
Microalb, Ur: 1.5 mg/dL

## 2020-01-08 LAB — HEMOGLOBIN A1C
Hgb A1c MFr Bld: 8.5 % of total Hgb — ABNORMAL HIGH (ref <5.7)
Mean Plasma Glucose: 197 (calc)
eAG (mmol/L): 10.9 (calc)

## 2020-01-08 MED ORDER — DAPAGLIFLOZIN PROPANEDIOL 10 MG PO TABS: 10.0000 mg | ORAL_TABLET | Freq: Every day | ORAL | 3 refills | Status: DC

## 2020-01-15 ENCOUNTER — Ambulatory Visit: Payer: BC Managed Care – PPO | Admitting: Family Medicine

## 2020-02-12 ENCOUNTER — Ambulatory Visit: Payer: BC Managed Care – PPO | Admitting: Family Medicine

## 2020-02-24 ENCOUNTER — Other Ambulatory Visit: Payer: Self-pay | Admitting: Family Medicine

## 2020-02-24 DIAGNOSIS — F419 Anxiety disorder, unspecified: Secondary | ICD-10-CM

## 2020-02-26 ENCOUNTER — Other Ambulatory Visit: Payer: Self-pay | Admitting: Family Medicine

## 2020-02-26 DIAGNOSIS — U071 COVID-19: Secondary | ICD-10-CM

## 2020-02-29 IMAGING — DX LUMBAR SPINE - 2-3 VIEW
3 series · 4 of 4 positions shown · non-contrast
Comparison: CT 06/22/2016

CLINICAL DATA: Right low back pain. Heard pop after bending over 9
days ago.

EXAM:
LUMBAR SPINE - 2-3 VIEW

[l-spine ap]
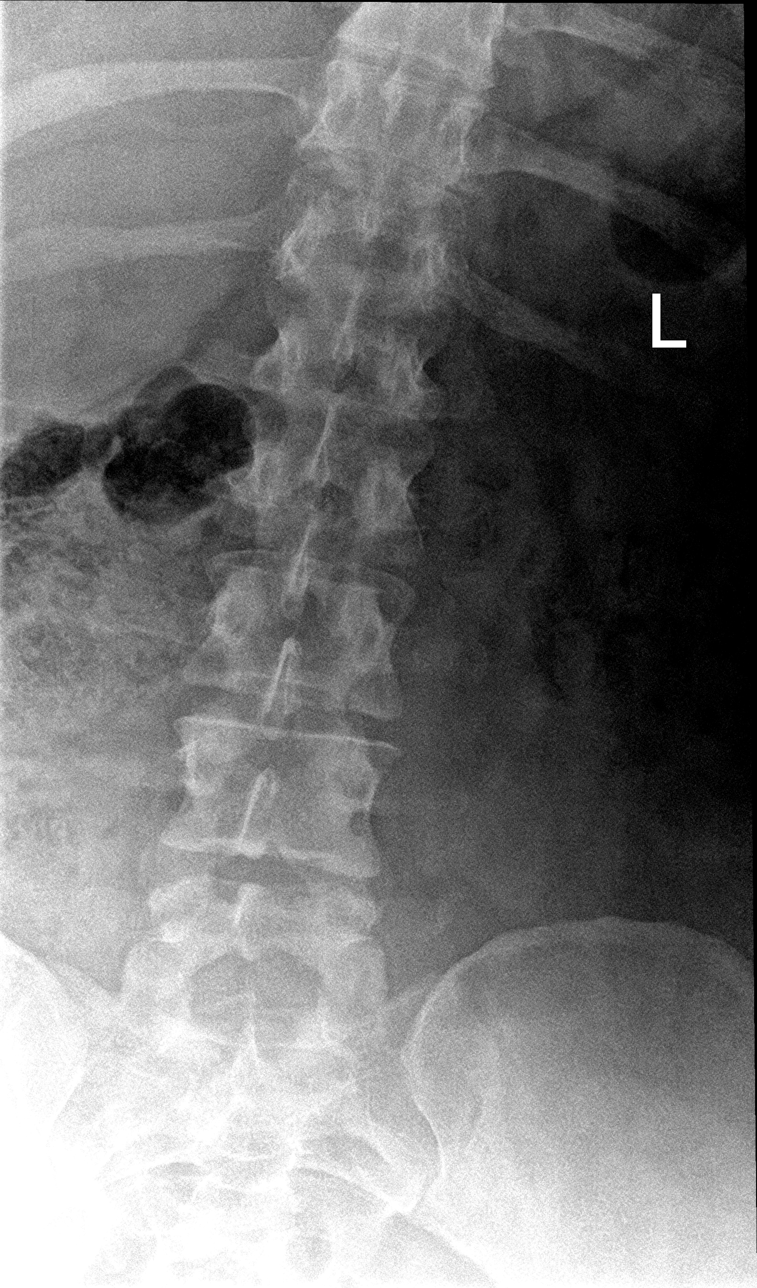

[l-spine lat]
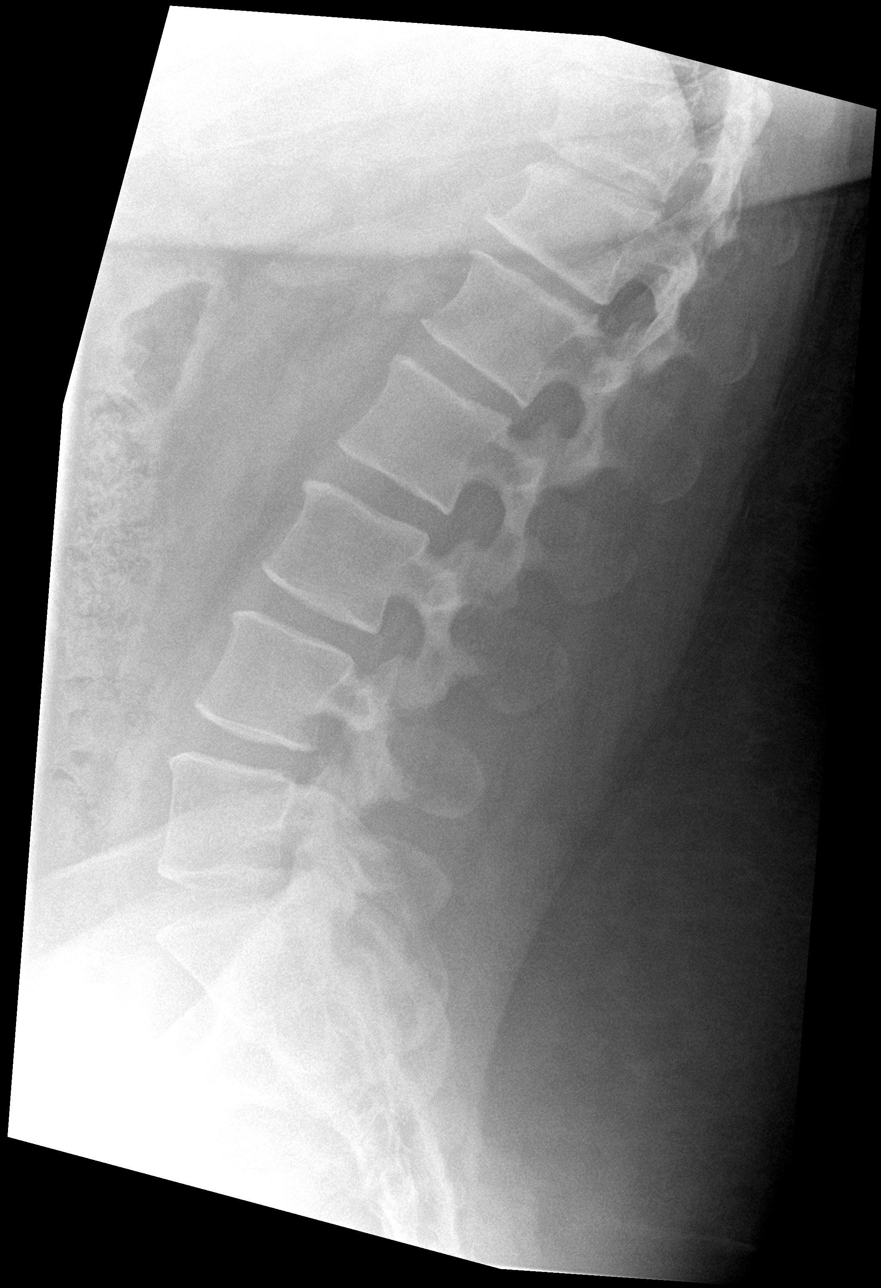

[Series 3: l-spine spot · 0.14mm/px · 2 of 2 slices shown]
[im 1/2]
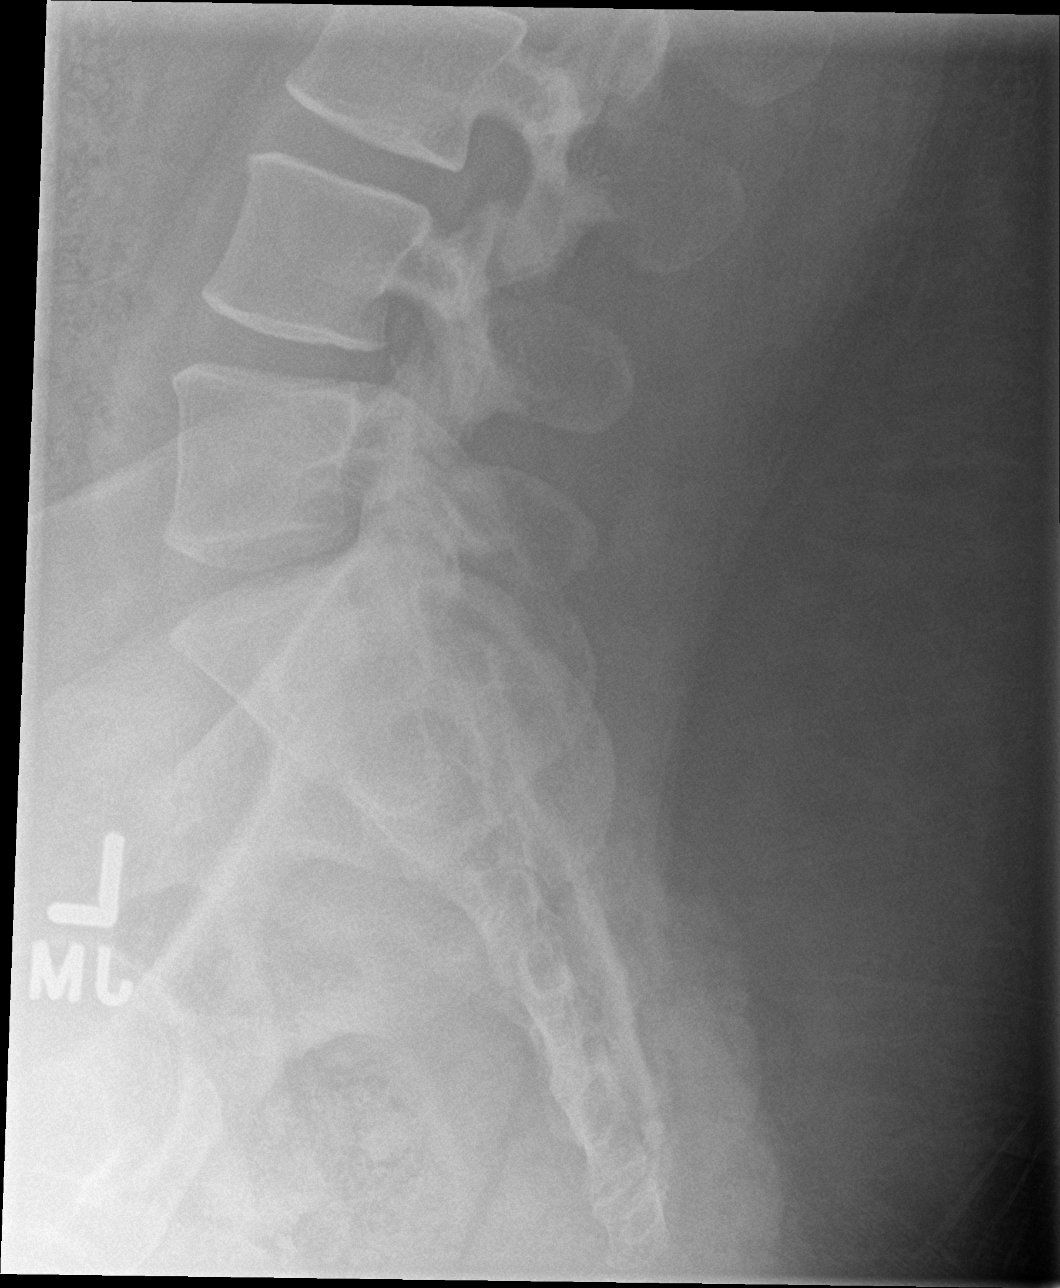
[im 2/2]
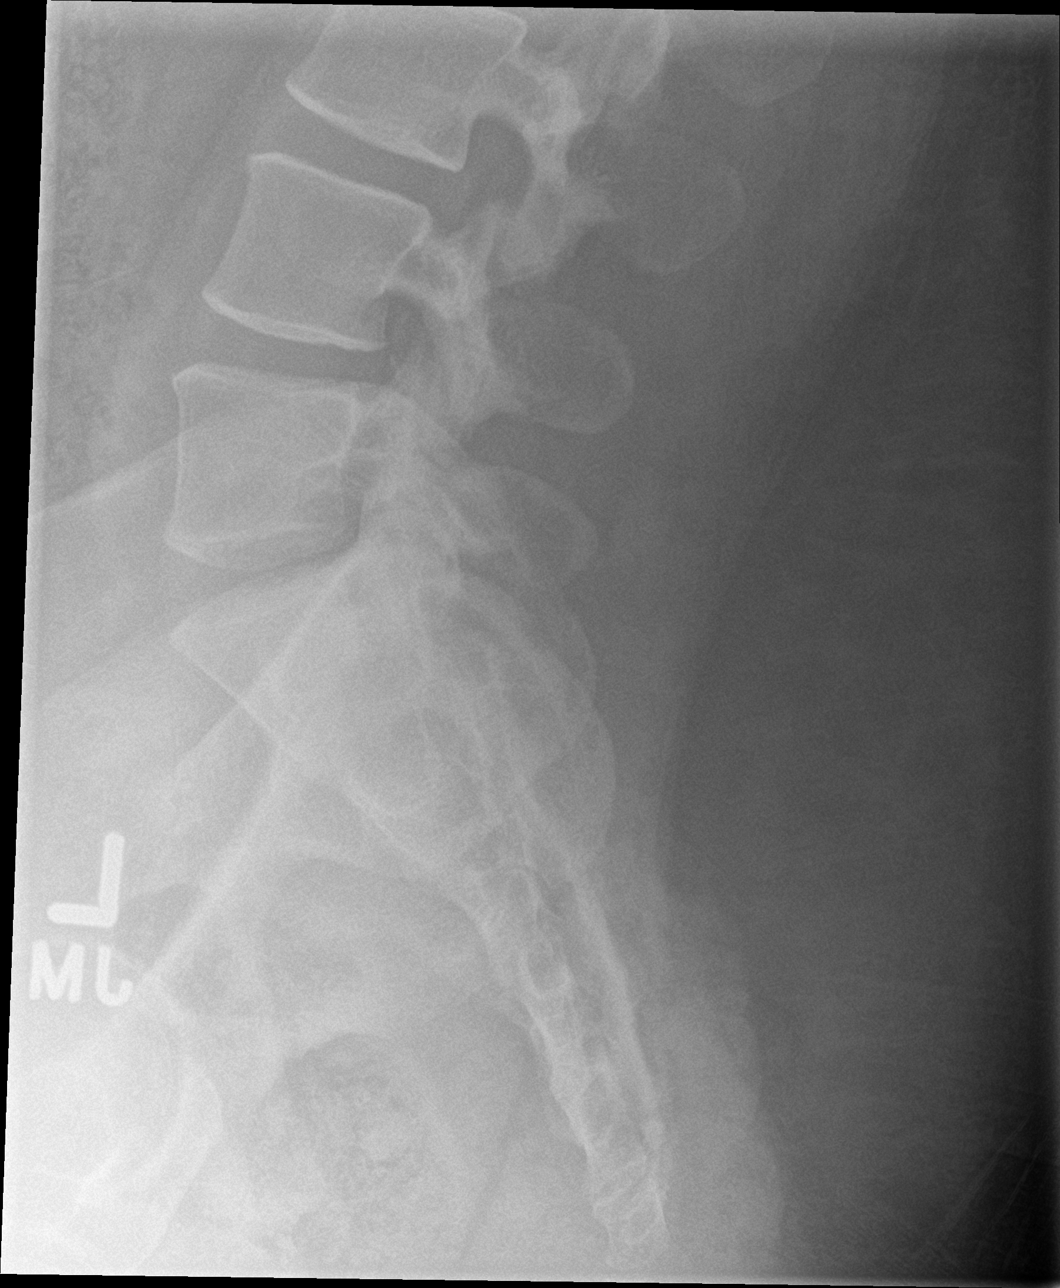

[4 of 4 positions shown; findings below may reference images not displayed]

FINDINGS: Slight wedged appearance of the T11 and T12 vertebral bodies is
stable since prior study, favor congenital/developmental. Disc
spaces are maintained. No acute fracture or subluxation. SI joints
symmetric and unremarkable.
IMPRESSION: Slight wedged appearance of the T11 and T12 vertebral bodies is
stable since 3968 CT, favor congenital/developmental. No acute bony
abnormality.

## 2020-03-10 ENCOUNTER — Other Ambulatory Visit: Payer: Self-pay | Admitting: Family Medicine

## 2020-03-10 ENCOUNTER — Ambulatory Visit: Payer: BC Managed Care – PPO | Admitting: Family Medicine

## 2020-03-10 MED ORDER — GLYBURIDE 5 MG PO TABS: 5.0000 mg | ORAL_TABLET | Freq: Every day | ORAL | 2 refills | Status: DC

## 2020-03-25 ENCOUNTER — Other Ambulatory Visit: Payer: Self-pay | Admitting: Family Medicine

## 2020-03-25 DIAGNOSIS — F419 Anxiety disorder, unspecified: Secondary | ICD-10-CM

## 2020-04-09 ENCOUNTER — Ambulatory Visit: Payer: BC Managed Care – PPO | Admitting: Family Medicine

## 2020-04-24 ENCOUNTER — Other Ambulatory Visit: Payer: Self-pay | Admitting: Family Medicine

## 2020-04-24 DIAGNOSIS — F32A Depression, unspecified: Secondary | ICD-10-CM

## 2020-04-24 DIAGNOSIS — F419 Anxiety disorder, unspecified: Secondary | ICD-10-CM

## 2020-04-25 ENCOUNTER — Ambulatory Visit: Payer: BC Managed Care – PPO | Admitting: Family Medicine

## 2020-04-29 DIAGNOSIS — I10 Essential (primary) hypertension: Secondary | ICD-10-CM

## 2020-04-29 MED ORDER — AMLODIPINE BESYLATE 5 MG PO TABS: 5.0000 mg | ORAL_TABLET | Freq: Every day | ORAL | 2 refills | Status: DC

## 2020-05-06 ENCOUNTER — Ambulatory Visit: Payer: BC Managed Care – PPO | Admitting: Family Medicine

## 2020-05-09 ENCOUNTER — Ambulatory Visit: Payer: BC Managed Care – PPO | Admitting: Family Medicine

## 2020-05-16 ENCOUNTER — Ambulatory Visit: Payer: BC Managed Care – PPO | Admitting: Family Medicine

## 2020-05-21 ENCOUNTER — Other Ambulatory Visit: Payer: Self-pay | Admitting: Family Medicine

## 2020-05-23 ENCOUNTER — Ambulatory Visit: Payer: BC Managed Care – PPO | Admitting: Family Medicine

## 2020-06-18 ENCOUNTER — Other Ambulatory Visit: Payer: Self-pay | Admitting: Family Medicine

## 2020-06-18 DIAGNOSIS — F32A Depression, unspecified: Secondary | ICD-10-CM

## 2020-07-16 ENCOUNTER — Ambulatory Visit: Payer: BC Managed Care – PPO | Admitting: Family Medicine

## 2020-07-18 ENCOUNTER — Ambulatory Visit: Payer: BC Managed Care – PPO | Admitting: Family Medicine

## 2020-07-25 ENCOUNTER — Other Ambulatory Visit: Payer: Self-pay

## 2020-07-25 ENCOUNTER — Ambulatory Visit (INDEPENDENT_AMBULATORY_CARE_PROVIDER_SITE_OTHER): Payer: BC Managed Care – PPO | Admitting: Family Medicine

## 2020-07-25 ENCOUNTER — Encounter: Payer: Self-pay | Admitting: Family Medicine

## 2020-07-25 VITALS — BP 152/90 | HR 104 | Temp 97.4°F | Resp 19 | Ht 72.0 in | Wt 385.0 lb

## 2020-07-25 DIAGNOSIS — E1169 Type 2 diabetes mellitus with other specified complication: Secondary | ICD-10-CM

## 2020-07-25 DIAGNOSIS — F411 Generalized anxiety disorder: Secondary | ICD-10-CM | POA: Diagnosis not present

## 2020-07-25 DIAGNOSIS — I10 Essential (primary) hypertension: Secondary | ICD-10-CM

## 2020-07-25 DIAGNOSIS — F324 Major depressive disorder, single episode, in partial remission: Secondary | ICD-10-CM | POA: Diagnosis not present

## 2020-07-25 DIAGNOSIS — F3175 Bipolar disorder, in partial remission, most recent episode depressed: Secondary | ICD-10-CM

## 2020-07-25 DIAGNOSIS — E669 Obesity, unspecified: Secondary | ICD-10-CM

## 2020-07-25 MED ORDER — ALBUTEROL SULFATE HFA 108 (90 BASE) MCG/ACT IN AERS: 1.0000 | INHALATION_SPRAY | Freq: Four times a day (QID) | RESPIRATORY_TRACT | 0 refills | Status: DC | PRN

## 2020-07-25 MED ORDER — RISPERIDONE 1 MG PO TABS: 1.0000 mg | ORAL_TABLET | Freq: Every day | ORAL | 2 refills | Status: DC

## 2020-07-25 MED ORDER — HYDROXYZINE PAMOATE 25 MG PO CAPS: 25.0000 mg | ORAL_CAPSULE | Freq: Three times a day (TID) | ORAL | 0 refills | Status: DC | PRN

## 2020-07-25 NOTE — Patient Instructions (Addendum)
Please consider counseling. Contact [phone] to schedule an appointment or inquire about cost/insurance coverage.  Aim to do some physical exertion for 150 minutes per week. This is typically divided into 5 days per week, 30 minutes per day. The activity should be enough to get your heart rate up. Anything is better than nothing if you have time constraints.  Coping skills Choose 5 that work for you:  Take a deep breath  Count to 20  Read a book  Do a puzzle  Meditate  Bake  Sing  Knit  Garden  Pray  Go outside  Call a friend  Listen to music  Take a walk  Color  Send a note  Take a bath  Watch a movie  Be alone in a quiet place  Pet an animal  Visit a friend  Journal  Exercise  Stretch    Let us know if you need anything.  

## 2020-07-25 NOTE — Progress Notes (Signed)
Chief Complaint  Patient presents with  . Depression    Panick attack, anxiety, discuss medication     Subjective Luis Boyer presents for f/u anxiety/depression.  Pt is currently being treated with Zoloft 100 mg/d.  Reports worsening over past month. Poor sleep, panic attacks. Uncle passed away around 1 mo ago.  No thoughts of harming self or others. No self-medication with alcohol, prescription drugs or illicit drugs. Pt is not following with a counselor/psychologist.  Past Medical History:  Diagnosis Date  . ADD (attention deficit disorder)   . Asthma   . Bipolar 1 disorder (HCC)   . Diabetes mellitus type 2 in obese (HCC) 08/02/2016  . Hypertension   . Obesity    Allergies as of 07/25/2020      Reactions   Celebrex [celecoxib] Shortness Of Breath   Ace Inhibitors Other (See Comments)   angioedema      Medication List       Accurate as of Jul 25, 2020  1:40 PM. If you have any questions, ask your nurse or doctor.        STOP taking these medications   ARIPiprazole 2 MG tablet Commonly known as: ABILIFY Stopped by: Sharlene Dory, DO   baclofen 10 MG tablet Commonly known as: LIORESAL Stopped by: Sharlene Dory, DO   carvedilol 25 MG tablet Commonly known as: COREG Stopped by: Sharlene Dory, DO   lidocaine 2 % solution Commonly known as: XYLOCAINE Stopped by: Sharlene Dory, DO   ondansetron 4 MG disintegrating tablet Commonly known as: ZOFRAN-ODT Stopped by: Sharlene Dory, DO   pioglitazone 30 MG tablet Commonly known as: ACTOS Stopped by: Sharlene Dory, DO     TAKE these medications   albuterol 108 (90 Base) MCG/ACT inhaler Commonly known as: VENTOLIN HFA Inhale 1-2 puffs into the lungs every 6 (six) hours as needed for wheezing. Started by: Sharlene Dory, DO   amLODipine 5 MG tablet Commonly known as: NORVASC Take 1 tablet (5 mg total) by mouth daily.   chlorthalidone 25 MG  tablet Commonly known as: HYGROTON Take 1 tablet (25 mg total) by mouth daily.   dapagliflozin propanediol 10 MG Tabs tablet Commonly known as: FARXIGA Take 1 tablet (10 mg total) by mouth daily before breakfast.   fluticasone 50 MCG/ACT nasal spray Commonly known as: FLONASE Place 1 spray into both nostrils daily.   glyBURIDE 5 MG tablet Commonly known as: DIABETA Take 1 tablet by mouth once daily with breakfast   hydrOXYzine 25 MG capsule Commonly known as: VISTARIL Take 1-3 capsules (25-75 mg total) by mouth every 8 (eight) hours as needed. Started by: Sharlene Dory, DO   loratadine 10 MG tablet Commonly known as: CLARITIN Take 1 tablet (10 mg total) by mouth daily.   metFORMIN 500 MG tablet Commonly known as: GLUCOPHAGE Take 2 tablets (1,000 mg total) by mouth 2 (two) times daily with a meal.   multivitamin with minerals Tabs tablet Take 1 tablet by mouth daily.   risperiDONE 1 MG tablet Commonly known as: RisperDAL Take 1 tablet (1 mg total) by mouth at bedtime. Started by: Sharlene Dory, DO   sertraline 100 MG tablet Commonly known as: ZOLOFT Take 1 tablet by mouth once daily       Exam BP (!) 152/90 (BP Location: Left Arm, Patient Position: Sitting, Cuff Size: Large)   Pulse (!) 104   Temp (!) 97.4 F (36.3 C)   Resp 19  Ht 6' (1.829 m)   Wt (!) 385 lb (174.6 kg)   SpO2 97%   BMI 52.22 kg/m  General:  well developed, well nourished, in no apparent distress Lungs:  No respiratory distress Psych: well oriented with normal range of affect and age-appropriate judgement/insight, alert and oriented x4.  Assessment and Plan  Depression, major, single episode, in partial remission (HCC) - Plan: risperiDONE (RISPERDAL) 1 MG tablet  GAD (generalized anxiety disorder) - Plan: risperiDONE (RISPERDAL) 1 MG tablet, hydrOXYzine (VISTARIL) 25 MG capsule  Diabetes mellitus type 2 in obese (HCC), Chronic  Morbid obesity (HCC),  Chronic  Essential hypertension  Bipolar disorder, in partial remission, most recent episode depressed (HCC)  Chronic, unstable. Cont Zoloft 100 mg/d. Add Hydroxyzine prn. Add Risperdal at night. Counseling info provided.  F/u in 1 mo where we will reck and also ck DM. The patient voiced understanding and agreement to the plan.  Jilda Roche [place], DO 07/25/20 1:40 PM

## 2020-07-27 ENCOUNTER — Other Ambulatory Visit: Payer: Self-pay | Admitting: Family Medicine

## 2020-07-27 DIAGNOSIS — F419 Anxiety disorder, unspecified: Secondary | ICD-10-CM

## 2020-07-30 ENCOUNTER — Ambulatory Visit: Payer: BC Managed Care – PPO | Admitting: Family Medicine

## 2020-08-04 ENCOUNTER — Other Ambulatory Visit: Payer: Self-pay | Admitting: Family Medicine

## 2020-08-08 ENCOUNTER — Ambulatory Visit: Payer: BC Managed Care – PPO | Admitting: Family Medicine

## 2020-08-22 ENCOUNTER — Other Ambulatory Visit: Payer: Self-pay | Admitting: Family Medicine

## 2020-08-22 DIAGNOSIS — F419 Anxiety disorder, unspecified: Secondary | ICD-10-CM

## 2020-08-29 ENCOUNTER — Ambulatory Visit: Payer: BC Managed Care – PPO | Admitting: Family Medicine

## 2020-09-09 ENCOUNTER — Other Ambulatory Visit: Payer: Self-pay | Admitting: Family Medicine

## 2020-09-09 MED ORDER — TRAMADOL HCL 50 MG PO TABS: 50.0000 mg | ORAL_TABLET | Freq: Two times a day (BID) | ORAL | 0 refills | Status: DC | PRN

## 2020-09-10 ENCOUNTER — Other Ambulatory Visit: Payer: Self-pay | Admitting: Family Medicine

## 2020-09-24 ENCOUNTER — Ambulatory Visit: Payer: BC Managed Care – PPO | Admitting: Family Medicine

## 2020-09-30 ENCOUNTER — Other Ambulatory Visit: Payer: Self-pay | Admitting: Family Medicine

## 2020-09-30 DIAGNOSIS — F419 Anxiety disorder, unspecified: Secondary | ICD-10-CM

## 2020-10-31 ENCOUNTER — Telehealth: Payer: BC Managed Care – PPO | Admitting: Physician Assistant

## 2020-10-31 DIAGNOSIS — B9689 Other specified bacterial agents as the cause of diseases classified elsewhere: Secondary | ICD-10-CM

## 2020-10-31 DIAGNOSIS — J019 Acute sinusitis, unspecified: Secondary | ICD-10-CM

## 2020-10-31 MED ORDER — AMOXICILLIN-POT CLAVULANATE 875-125 MG PO TABS: 1.0000 | ORAL_TABLET | Freq: Two times a day (BID) | ORAL | 0 refills | Status: DC

## 2020-10-31 NOTE — Progress Notes (Signed)

## 2020-11-07 ENCOUNTER — Ambulatory Visit: Payer: BC Managed Care – PPO | Admitting: Family Medicine

## 2020-11-07 ENCOUNTER — Other Ambulatory Visit: Payer: Self-pay | Admitting: Family Medicine

## 2020-11-07 DIAGNOSIS — F419 Anxiety disorder, unspecified: Secondary | ICD-10-CM

## 2020-11-07 DIAGNOSIS — E1169 Type 2 diabetes mellitus with other specified complication: Secondary | ICD-10-CM

## 2020-11-07 DIAGNOSIS — E669 Obesity, unspecified: Secondary | ICD-10-CM

## 2020-11-07 DIAGNOSIS — F32A Depression, unspecified: Secondary | ICD-10-CM

## 2020-12-02 ENCOUNTER — Telehealth: Payer: BC Managed Care – PPO | Admitting: Nurse Practitioner

## 2020-12-02 DIAGNOSIS — J0110 Acute frontal sinusitis, unspecified: Secondary | ICD-10-CM

## 2020-12-02 MED ORDER — DOXYCYCLINE HYCLATE 100 MG PO TABS: 100.0000 mg | ORAL_TABLET | Freq: Two times a day (BID) | ORAL | 0 refills | Status: DC

## 2020-12-02 MED ORDER — DOXYCYCLINE HYCLATE 100 MG PO TABS: 100.0000 mg | ORAL_TABLET | Freq: Two times a day (BID) | ORAL | 0 refills | Status: AC

## 2020-12-02 NOTE — Progress Notes (Signed)
E-Visit for Sinus Problems ° °We are sorry that you are not feeling well.  Here is how we plan to help! ° °Based on what you have shared with me it looks like you have sinusitis.  Sinusitis is inflammation and infection in the sinus cavities of the head.  Based on your presentation I believe you most likely have Acute Bacterial Sinusitis.  This is an infection caused by bacteria and is treated with antibiotics. I have prescribed Doxycycline 100mg by mouth twice a day for 10 days. You may use an oral decongestant such as Mucinex D or if you have glaucoma or high blood pressure use plain Mucinex. Saline nasal spray help and can safely be used as often as needed for congestion.  If you develop worsening sinus pain, fever or notice severe headache and vision changes, or if symptoms are not better after completion of antibiotic, please schedule an appointment with a health care provider.   ° °Sinus infections are not as easily transmitted as other respiratory infection, however we still recommend that you avoid close contact with loved ones, especially the very young and elderly.  Remember to wash your hands thoroughly throughout the day as this is the number one way to prevent the spread of infection! ° °Home Care: °Only take medications as instructed by your medical team. °Complete the entire course of an antibiotic. °Do not take these medications with alcohol. °A steam or ultrasonic humidifier can help congestion.  You can place a towel over your head and breathe in the steam from hot water coming from a faucet. °Avoid close contacts especially the very young and the elderly. °Cover your mouth when you cough or sneeze. °Always remember to wash your hands. ° °Get Help Right Away If: °You develop worsening fever or sinus pain. °You develop a severe head ache or visual changes. °Your symptoms persist after you have completed your treatment plan. ° °Make sure you °Understand these instructions. °Will watch your  condition. °Will get help right away if you are not doing well or get worse. ° °Thank you for choosing an e-visit. ° °Your e-visit answers were reviewed by a board certified advanced clinical practitioner to complete your personal care plan. Depending upon the condition, your plan could have included both over the counter or prescription medications. ° °Please review your pharmacy choice. Make sure the pharmacy is open so you can pick up prescription now. If there is a problem, you may contact your provider through MyChart messaging and have the prescription routed to another pharmacy.  Your safety is important to us. If you have drug allergies check your prescription carefully.  ° °For the next 24 hours you can use MyChart to ask questions about today's visit, request a non-urgent call back, or ask for a work or school excuse. °You will get an email in the next two days asking about your experience. I hope that your e-visit has been valuable and will speed your recovery.  ° °I spent approximately 7 minutes reviewing the patient's history, current symptoms and coordinating their plan of care today.   ° °Meds ordered this encounter  °Medications  ° doxycycline (VIBRA-TABS) 100 MG tablet  °  Sig: Take 1 tablet (100 mg total) by mouth 2 (two) times daily for 10 days.  °  Dispense:  20 tablet  °  Refill:  0  °  °

## 2020-12-02 NOTE — Addendum Note (Signed)
Addended by: Viviano Simas E on: 12/02/2020 04:18 PM   Modules accepted: Orders

## 2020-12-08 ENCOUNTER — Other Ambulatory Visit: Payer: Self-pay | Admitting: Family Medicine

## 2020-12-08 DIAGNOSIS — F32A Depression, unspecified: Secondary | ICD-10-CM

## 2020-12-08 DIAGNOSIS — F419 Anxiety disorder, unspecified: Secondary | ICD-10-CM

## 2020-12-16 ENCOUNTER — Ambulatory Visit: Payer: BC Managed Care – PPO | Admitting: Family Medicine

## 2020-12-22 ENCOUNTER — Ambulatory Visit: Payer: BC Managed Care – PPO | Admitting: Family Medicine

## 2020-12-24 ENCOUNTER — Telehealth: Payer: BC Managed Care – PPO | Admitting: Physician Assistant

## 2020-12-24 DIAGNOSIS — B9689 Other specified bacterial agents as the cause of diseases classified elsewhere: Secondary | ICD-10-CM

## 2020-12-24 DIAGNOSIS — J019 Acute sinusitis, unspecified: Secondary | ICD-10-CM | POA: Diagnosis not present

## 2020-12-24 MED ORDER — DOXYCYCLINE HYCLATE 100 MG PO TABS
100.0000 mg | ORAL_TABLET | Freq: Two times a day (BID) | ORAL | 0 refills | Status: DC
Start: 2020-12-24 — End: 2021-02-06

## 2020-12-24 NOTE — Progress Notes (Signed)

## 2020-12-24 NOTE — Progress Notes (Signed)
I have spent 5 minutes in review of e-visit questionnaire, review and updating patient chart, medical decision making and response to patient.   Letitia Sabala Cody Nyeli Holtmeyer, PA-C    

## 2021-01-09 ENCOUNTER — Ambulatory Visit: Payer: BC Managed Care – PPO | Admitting: Family Medicine

## 2021-01-09 ENCOUNTER — Other Ambulatory Visit: Payer: Self-pay | Admitting: Family Medicine

## 2021-01-09 DIAGNOSIS — I10 Essential (primary) hypertension: Secondary | ICD-10-CM

## 2021-01-26 ENCOUNTER — Other Ambulatory Visit: Payer: Self-pay | Admitting: Family Medicine

## 2021-01-26 DIAGNOSIS — F419 Anxiety disorder, unspecified: Secondary | ICD-10-CM

## 2021-01-26 DIAGNOSIS — I10 Essential (primary) hypertension: Secondary | ICD-10-CM

## 2021-02-03 ENCOUNTER — Other Ambulatory Visit: Payer: Self-pay | Admitting: Family Medicine

## 2021-02-03 ENCOUNTER — Encounter: Payer: Self-pay | Admitting: Family Medicine

## 2021-02-03 DIAGNOSIS — F32A Depression, unspecified: Secondary | ICD-10-CM

## 2021-02-06 ENCOUNTER — Telehealth (INDEPENDENT_AMBULATORY_CARE_PROVIDER_SITE_OTHER): Payer: BC Managed Care – PPO | Admitting: Family Medicine

## 2021-02-06 ENCOUNTER — Ambulatory Visit: Payer: BC Managed Care – PPO | Admitting: Family Medicine

## 2021-02-06 ENCOUNTER — Encounter: Payer: Self-pay | Admitting: Family Medicine

## 2021-02-06 DIAGNOSIS — I10 Essential (primary) hypertension: Secondary | ICD-10-CM

## 2021-02-06 DIAGNOSIS — F324 Major depressive disorder, single episode, in partial remission: Secondary | ICD-10-CM | POA: Diagnosis not present

## 2021-02-06 DIAGNOSIS — E1169 Type 2 diabetes mellitus with other specified complication: Secondary | ICD-10-CM

## 2021-02-06 DIAGNOSIS — F411 Generalized anxiety disorder: Secondary | ICD-10-CM | POA: Diagnosis not present

## 2021-02-06 DIAGNOSIS — E669 Obesity, unspecified: Secondary | ICD-10-CM

## 2021-02-06 MED ORDER — CHLORTHALIDONE 25 MG PO TABS: 25.0000 mg | ORAL_TABLET | Freq: Every day | ORAL | 5 refills | Status: DC

## 2021-02-06 MED ORDER — METFORMIN HCL 500 MG PO TABS: ORAL_TABLET | ORAL | 5 refills | Status: DC

## 2021-02-06 MED ORDER — GLYBURIDE 5 MG PO TABS: 5.0000 mg | ORAL_TABLET | Freq: Every day | ORAL | 5 refills | Status: DC

## 2021-02-06 MED ORDER — ARIPIPRAZOLE 5 MG PO TABS: 5.0000 mg | ORAL_TABLET | Freq: Every day | ORAL | 2 refills | Status: DC

## 2021-02-06 MED ORDER — SERTRALINE HCL 100 MG PO TABS: 100.0000 mg | ORAL_TABLET | Freq: Every day | ORAL | 1 refills | Status: DC

## 2021-02-06 MED ORDER — AMLODIPINE BESYLATE 5 MG PO TABS: 5.0000 mg | ORAL_TABLET | Freq: Every day | ORAL | 5 refills | Status: DC

## 2021-02-06 MED ORDER — CARVEDILOL 12.5 MG PO TABS: 12.5000 mg | ORAL_TABLET | Freq: Two times a day (BID) | ORAL | 3 refills | Status: DC

## 2021-02-06 NOTE — Progress Notes (Addendum)
Chief Complaint  Patient presents with   Follow-up    medications    Subjective Luis Boyer presents for f/u anxiety/depression. Due to COVID-19 pandemic, we are interacting via web portal for an electronic face-to-face visit. I verified patient's ID using 2 identifiers. Patient agreed to proceed with visit via this method. Patient is at home, I am at office. Patient and I are present for visit.   Pt is currently being treated with Zoloft 100 mg/d and Risperdal 1 mg qhs.  Reports doing poorly w more depression/anxiety since treatment. No thoughts of harming self or others. No self-medication with alcohol, prescription drugs or illicit drugs. Pt is not following with a counselor/psychologist.  Hypertension Patient presents for hypertension follow up. He does monitor home blood pressures. Blood pressures ranging on average from 140-150's/70's. He is compliant with medications. Patient has these side effects of medication: none He is adhering to a healthy diet overall. Exercise: walking No Cp or SOB.  DM II Luis Boyer self monitored glucose range is 100-150's.  Patient denies hypoglycemic reactions. He checks his glucose levels 1 time(s) per day. Patient does not require insulin.   Medications include: Metformin 1000 mg bid, glyburide 5 mg/d Diet/exercise as above  Past Medical History:  Diagnosis Date   ADD (attention deficit disorder)    Asthma    Bipolar 1 disorder (HCC)    Diabetes mellitus type 2 in obese (HCC) 08/02/2016   Hypertension    Obesity    Allergies as of 02/06/2021       Reactions   Celebrex [celecoxib] Shortness Of Breath   Ace Inhibitors Other (See Comments)   angioedema        Medication List        Accurate as of February 06, 2021 10:34 AM. If you have any questions, ask your nurse or doctor.          STOP taking these medications    doxycycline 100 MG tablet Commonly known as: VIBRA-TABS Stopped by: Sharlene Dory, DO    risperiDONE 1 MG tablet Commonly known as: RisperDAL Stopped by: Sharlene Dory, DO   traMADol 50 MG tablet Commonly known as: ULTRAM Stopped by: Sharlene Dory, DO       TAKE these medications    albuterol 108 (90 Base) MCG/ACT inhaler Commonly known as: VENTOLIN HFA Inhale 1-2 puffs into the lungs every 6 (six) hours as needed for wheezing.   amLODipine 5 MG tablet Commonly known as: NORVASC Take 1 tablet (5 mg total) by mouth daily.   ARIPiprazole 5 MG tablet Commonly known as: ABILIFY Take 1 tablet (5 mg total) by mouth daily. Started by: Sharlene Dory, DO   carvedilol 12.5 MG tablet Commonly known as: COREG Take 1 tablet (12.5 mg total) by mouth 2 (two) times daily with a meal. Started by: Sharlene Dory, DO   chlorthalidone 25 MG tablet Commonly known as: HYGROTON Take 1 tablet (25 mg total) by mouth daily.   fluticasone 50 MCG/ACT nasal spray Commonly known as: FLONASE Place 1 spray into both nostrils daily.   glyBURIDE 5 MG tablet Commonly known as: DIABETA Take 1 tablet (5 mg total) by mouth daily with breakfast.   hydrOXYzine 25 MG capsule Commonly known as: VISTARIL Take 1-3 capsules (25-75 mg total) by mouth every 8 (eight) hours as needed.   loratadine 10 MG tablet Commonly known as: CLARITIN Take 1 tablet (10 mg total) by mouth daily.   metFORMIN 500 MG tablet Commonly known  as: GLUCOPHAGE TAKE 2 TABLETS BY MOUTH TWICE DAILY WITH A MEAL   multivitamin with minerals Tabs tablet Take 1 tablet by mouth daily.   sertraline 100 MG tablet Commonly known as: ZOLOFT Take 1 tablet (100 mg total) by mouth daily.        Exam There were no vitals taken for this visit. General:  well developed, well nourished, in no apparent distress Lungs:  No respiratory distress Psych: well oriented with normal range of affect and age-appropriate judgement/insight, alert and oriented x4.  Assessment and Plan  Depression,  major, single episode, in partial remission (HCC) - Plan: ARIPiprazole (ABILIFY) 5 MG tablet  Essential hypertension - Plan: chlorthalidone (HYGROTON) 25 MG tablet, amLODipine (NORVASC) 5 MG tablet, carvedilol (COREG) 12.5 MG tablet  Diabetes mellitus type 2 in obese (HCC) - Plan: glyBURIDE (DIABETA) 5 MG tablet, metFORMIN (GLUCOPHAGE) 500 MG tablet  GAD (generalized anxiety disorder)  Anxiety and depression - Plan: sertraline (ZOLOFT) 100 MG tablet  1/2.  Chronic, unstable.  Continue Zoloft 100 mg daily.  Stop Risperdal, start Abilify 5 mg nightly. F/u in 1 mo. 3.  Chronic, unstable.  Continue chlorthalidone 25 mg daily, amlodipine 5 mg daily, carvedilol 12.5 mg twice daily.  Continue to monitor blood pressure at home.  Counseled on diet and exercise.  Follow-up in 1 month. 4.  Chronic, unsure if stable.  Continue metformin 1000 mg twice daily, glyburide 5 mg daily.  Monitor sugars at home.  We will need to check some labs in person in 1 month. The patient voiced understanding and agreement to the plan.  Luis Boyer [place], DO 02/06/21 10:34 AM

## 2021-02-17 ENCOUNTER — Encounter: Payer: Self-pay | Admitting: Family Medicine

## 2021-02-18 ENCOUNTER — Other Ambulatory Visit: Payer: Self-pay | Admitting: Family Medicine

## 2021-02-18 MED ORDER — LORATADINE 10 MG PO TABS: 10.0000 mg | ORAL_TABLET | Freq: Every day | ORAL | 0 refills | Status: DC

## 2021-02-20 ENCOUNTER — Telehealth: Payer: BC Managed Care – PPO | Admitting: Family

## 2021-02-20 DIAGNOSIS — J069 Acute upper respiratory infection, unspecified: Secondary | ICD-10-CM | POA: Diagnosis not present

## 2021-02-20 MED ORDER — FLUTICASONE PROPIONATE 50 MCG/ACT NA SUSP: 2.0000 | Freq: Every day | NASAL | 6 refills | Status: DC

## 2021-02-20 MED ORDER — BENZONATATE 100 MG PO CAPS: 100.0000 mg | ORAL_CAPSULE | Freq: Three times a day (TID) | ORAL | 0 refills | Status: DC | PRN

## 2021-02-20 NOTE — Progress Notes (Signed)

## 2021-02-26 ENCOUNTER — Other Ambulatory Visit: Payer: Self-pay | Admitting: Family Medicine

## 2021-03-03 ENCOUNTER — Telehealth: Payer: BC Managed Care – PPO | Admitting: Nurse Practitioner

## 2021-03-03 DIAGNOSIS — J029 Acute pharyngitis, unspecified: Secondary | ICD-10-CM

## 2021-03-03 DIAGNOSIS — J40 Bronchitis, not specified as acute or chronic: Secondary | ICD-10-CM | POA: Diagnosis not present

## 2021-03-03 MED ORDER — AZITHROMYCIN 250 MG PO TABS: ORAL_TABLET | ORAL | 0 refills | Status: AC

## 2021-03-03 MED ORDER — BENZONATATE 100 MG PO CAPS: 100.0000 mg | ORAL_CAPSULE | Freq: Three times a day (TID) | ORAL | 0 refills | Status: DC | PRN

## 2021-03-03 NOTE — Progress Notes (Signed)
We are sorry that you are not feeling well.  Here is how we plan to help!  Based on your presentation I believe you most likely have A cough due to bacteria.  When patients have a fever and a productive cough with a change in color or increased sputum production, we are concerned about bacterial bronchitis.  If left untreated it can progress to pneumonia.  If your symptoms do not improve with your treatment plan it is important that you contact your provider.   I have prescribed Azithromyin 250 mg: two tablets now and then one tablet daily for 4 additonal days    In addition you may use A prescription cough medication called Tessalon Perles 100mg. You may take 1-2 capsules every 8 hours as needed for your cough.    From your responses in the eVisit questionnaire you describe inflammation in the upper respiratory tract which is causing a significant cough.  This is commonly called Bronchitis and has four common causes:   Allergies Viral Infections Acid Reflux Bacterial Infection Allergies, viruses and acid reflux are treated by controlling symptoms or eliminating the cause. An example might be a cough caused by taking certain blood pressure medications. You stop the cough by changing the medication. Another example might be a cough caused by acid reflux. Controlling the reflux helps control the cough.  USE OF BRONCHODILATOR ("RESCUE") INHALERS: There is a risk from using your bronchodilator too frequently.  The risk is that over-reliance on a medication which only relaxes the muscles surrounding the breathing tubes can reduce the effectiveness of medications prescribed to reduce swelling and congestion of the tubes themselves.  Although you feel brief relief from the bronchodilator inhaler, your asthma may actually be worsening with the tubes becoming more swollen and filled with mucus.  This can delay other crucial treatments, such as oral steroid medications. If you need to use a bronchodilator  inhaler daily, several times per day, you should discuss this with your provider.  There are probably better treatments that could be used to keep your asthma under control.     HOME CARE Only take medications as instructed by your medical team. Complete the entire course of an antibiotic. Drink plenty of fluids and get plenty of rest. Avoid close contacts especially the very young and the elderly Cover your mouth if you cough or cough into your sleeve. Always remember to wash your hands A steam or ultrasonic humidifier can help congestion.   GET HELP RIGHT AWAY IF: You develop worsening fever. You become short of breath You cough up blood. Your symptoms persist after you have completed your treatment plan MAKE SURE YOU  Understand these instructions. Will watch your condition. Will get help right away if you are not doing well or get worse.    Thank you for choosing an e-visit.  Your e-visit answers were reviewed by a board certified advanced clinical practitioner to complete your personal care plan. Depending upon the condition, your plan could have included both over the counter or prescription medications.  Please review your pharmacy choice. Make sure the pharmacy is open so you can pick up prescription now. If there is a problem, you may contact your provider through MyChart messaging and have the prescription routed to another pharmacy.  Your safety is important to us. If you have drug allergies check your prescription carefully.   For the next 24 hours you can use MyChart to ask questions about today's visit, request a non-urgent call back, or   ask for a work or school excuse. You will get an email in the next two days asking about your experience. I hope that your e-visit has been valuable and will speed your recovery.   I spent approximately 7 minutes reviewing the patient's history, current symptoms and coordinating their plan of care today.    Meds ordered this encounter   Medications   benzonatate (TESSALON) 100 MG capsule    Sig: Take 1 capsule (100 mg total) by mouth 3 (three) times daily as needed for cough.    Dispense:  30 capsule    Refill:  0   azithromycin (ZITHROMAX) 250 MG tablet    Sig: Take 2 tablets on day 1, then 1 tablet daily on days 2 through 5    Dispense:  6 tablet    Refill:  0

## 2021-03-10 ENCOUNTER — Telehealth: Payer: BC Managed Care – PPO | Admitting: Family Medicine

## 2021-03-10 ENCOUNTER — Other Ambulatory Visit: Payer: Self-pay | Admitting: Family Medicine

## 2021-03-10 ENCOUNTER — Encounter: Payer: Self-pay | Admitting: Family Medicine

## 2021-03-10 DIAGNOSIS — I10 Essential (primary) hypertension: Secondary | ICD-10-CM

## 2021-03-10 MED ORDER — AMLODIPINE BESYLATE 5 MG PO TABS: 5.0000 mg | ORAL_TABLET | Freq: Every day | ORAL | 5 refills | Status: DC

## 2021-04-03 ENCOUNTER — Ambulatory Visit: Payer: BC Managed Care – PPO | Admitting: Family Medicine

## 2021-04-07 ENCOUNTER — Ambulatory Visit: Payer: BC Managed Care – PPO | Admitting: Family Medicine

## 2021-04-19 ENCOUNTER — Telehealth: Payer: BC Managed Care – PPO | Admitting: Physician Assistant

## 2021-04-19 DIAGNOSIS — H66001 Acute suppurative otitis media without spontaneous rupture of ear drum, right ear: Secondary | ICD-10-CM | POA: Diagnosis not present

## 2021-04-19 MED ORDER — AMOXICILLIN-POT CLAVULANATE 875-125 MG PO TABS: 1.0000 | ORAL_TABLET | Freq: Two times a day (BID) | ORAL | 0 refills | Status: DC

## 2021-04-19 MED ORDER — NEOMYCIN-POLYMYXIN-HC 3.5-10000-1 OT SOLN: 3.0000 [drp] | Freq: Four times a day (QID) | OTIC | 0 refills | Status: DC

## 2021-04-19 NOTE — Progress Notes (Signed)
E Visit for Swimmer's Ear  We are sorry that you are not feeling well. Here is how we plan to help!  I have prescribed: Neomycin 0.35%, polymyxin B 10,000 units/mL, and hydrocortisone 0,5% otic solution 4 drops in affected ears four times a day for 7 days  Based on what you have told me you may have a bacterial infection. In addition to the ear drops I have prescribed an oral antibiotic: and Augmentin 875-125mg one tablet by mouth twice a day for 10 days  In certain cases swimmer's ear may progress to a more serious bacterial infection of the middle or inner ear.  If you have a fever 102 and up and significantly worsening symptoms, this could indicate a more serious infection moving to the middle/inner and needs face to face evaluation in an office by a provider.  Your symptoms should improve over the next 3 days and should resolve in about 7 days.  HOME CARE:  Wash your hands frequently. Do not place the tip of the bottle on your ear or touch it with your fingers. You can take Acetominophen 650 mg every 4-6 hours as needed for pain.  If pain is severe or moderate, you can apply a heating pad (set on low) or hot water bottle (wrapped in a towel) to outer ear for 20 minutes.  This will also increase drainage. Avoid ear plugs Do not use Q-tips After showers, help the water run out by tilting your head to one side.  GET HELP RIGHT AWAY IF:  Fever is over 102.2 degrees. You develop progressive ear pain or hearing loss. Ear symptoms persist longer than 3 days after treatment.  MAKE SURE YOU:  Understand these instructions. Will watch your condition. Will get help right away if you are not doing well or get worse.  TO PREVENT SWIMMER'S EAR: Use a bathing cap or custom fitted swim molds to keep your ears dry. Towel off after swimming to dry your ears. Tilt your head or pull your earlobes to allow the water to escape your ear canal. If there is still water in your ears, consider using a  hairdryer on the lowest setting.   Thank you for choosing an e-visit.  Your e-visit answers were reviewed by a board certified advanced clinical practitioner to complete your personal care plan. Depending upon the condition, your plan could have included both over the counter or prescription medications.  Please review your pharmacy choice. Make sure the pharmacy is open so you can pick up prescription now. If there is a problem, you may contact your provider through MyChart messaging and have the prescription routed to another pharmacy.  Your safety is important to us. If you have drug allergies check your prescription carefully.   For the next 24 hours you can use MyChart to ask questions about today's visit, request a non-urgent call back, or ask for a work or school excuse. You will get an email in the next two days asking about your experience. I hope that your e-visit has been valuable and will speed your recovery.   I provided 5 minutes of non face-to-face time during this encounter for chart review and documentation.   

## 2021-04-24 ENCOUNTER — Ambulatory Visit: Payer: BC Managed Care – PPO | Admitting: Family Medicine

## 2021-05-03 ENCOUNTER — Other Ambulatory Visit: Payer: Self-pay | Admitting: Family Medicine

## 2021-05-03 DIAGNOSIS — F324 Major depressive disorder, single episode, in partial remission: Secondary | ICD-10-CM

## 2021-05-03 DIAGNOSIS — F411 Generalized anxiety disorder: Secondary | ICD-10-CM

## 2021-05-11 ENCOUNTER — Ambulatory Visit: Payer: BC Managed Care – PPO | Admitting: Family Medicine

## 2021-05-17 ENCOUNTER — Telehealth: Payer: BC Managed Care – PPO | Admitting: Physician Assistant

## 2021-05-17 DIAGNOSIS — R29898 Other symptoms and signs involving the musculoskeletal system: Secondary | ICD-10-CM

## 2021-05-17 DIAGNOSIS — M545 Low back pain, unspecified: Secondary | ICD-10-CM

## 2021-05-18 NOTE — Progress Notes (Signed)
Based on what you shared with me, I feel your condition warrants further evaluation and I recommend that you be seen in a face to face visit. Giving mention of weakness/numbness in the lower extremities and radiation of pain to abdomen/groin with this pain, you need to be evaluated in person today, either with your primary care provider or at a local urgent care.  Please do not delay care.  ? ?  ?NOTE: There will be NO CHARGE for this eVisit ?  ?If you are having a true medical emergency please call 911.   ?  ? For an urgent face to face visit, [facility] has six urgent care centers for your convenience:  ?  ? [place] [facility] [place] [facility] ?Get Driving Directions ?[phone] ?[address] ?[place], [place] [address] ?  ? [place] [facility]) ?Get Driving Directions ?[phone] ?[address] ?[place], [place] [address] ? ?[facility] [place] [facility] ?Get Driving Directions ?[place]  [address] ? ?[facility] ?Get Driving Directions ?[phone] ?[address], [address] ?[place], [place] [address] ?  ?[facility] [place] ?Get Driving Directions  ?[phone] ?[address] ?[address] ?[place], [place] [address] ?  ?[facility] at [facility] ?Get Driving Directions ?[phone] ?[address] ?[place], [place] [address] ? ?Your MyChart E-visit questionnaire answers were reviewed by a board certified advanced clinical practitioner to complete your personal care plan based on your specific symptoms.  Thank you for using e-Visits. ?  ? ?

## 2021-05-19 ENCOUNTER — Telehealth: Payer: BC Managed Care – PPO | Admitting: Family Medicine

## 2021-05-19 ENCOUNTER — Encounter: Payer: Self-pay | Admitting: Family Medicine

## 2021-05-19 DIAGNOSIS — M545 Low back pain, unspecified: Secondary | ICD-10-CM

## 2021-05-19 NOTE — Progress Notes (Signed)
[place]  ? ?Needs in person eval as previously advised on 3/12 ?

## 2021-05-20 ENCOUNTER — Telehealth (INDEPENDENT_AMBULATORY_CARE_PROVIDER_SITE_OTHER): Payer: BC Managed Care – PPO | Admitting: Family Medicine

## 2021-05-20 ENCOUNTER — Encounter: Payer: Self-pay | Admitting: Family Medicine

## 2021-05-20 DIAGNOSIS — M545 Low back pain, unspecified: Secondary | ICD-10-CM

## 2021-05-20 MED ORDER — CYCLOBENZAPRINE HCL 10 MG PO TABS: 5.0000 mg | ORAL_TABLET | Freq: Three times a day (TID) | ORAL | 0 refills | Status: DC | PRN

## 2021-05-20 MED ORDER — TRAMADOL HCL 50 MG PO TABS: 50.0000 mg | ORAL_TABLET | Freq: Three times a day (TID) | ORAL | 0 refills | Status: AC | PRN

## 2021-05-20 NOTE — Progress Notes (Signed)
Musculoskeletal Exam ? ?Patient: Luis Boyer DOB: Jul 13, 1990 ? ?DOS: 05/20/2021 ? ?SUBJECTIVE: ? ?Chief Complaint:  ? ?Chief Complaint  ?Patient presents with  ? Back Pain  ? ? ?Luis Boyer is a 31 y.o.  male for evaluation and treatment of back pain. Due to COVID-19 pandemic, we are interacting via web portal for an electronic face-to-face visit. I verified patient's ID using 2 identifiers. Patient agreed to proceed with visit via this method. Patient is at home, I am at office. Patient and I are present for visit.  ? ?Onset:  2 days ago. Seemed to aggravate while lifting wts then felt pop w pain as he bent over.  ?Location: lower ?Character:  sharp  ?Progression of issue:  is unchanged ?Associated symptoms: difficult to bend over ?Denies bowel/bladder incontinence or weakness, swelling, bruising, redness ?Treatment: to date has been ice, OTC NSAIDS, and tramadol, stretches/exercises.   ?Neurovascular symptoms: no ? ?Past Medical History:  ?Diagnosis Date  ? ADD (attention deficit disorder)   ? Asthma   ? Bipolar 1 disorder (HCC)   ? Diabetes mellitus type 2 in obese (HCC) 08/02/2016  ? Hypertension   ? Obesity   ? ? ?Objective: ?No conversational dyspnea ?Age appropriate judgment and insight ?Nml affect and mood ? ?Assessment: ? ?Acute bilateral low back pain, unspecified whether sciatica present - Plan: cyclobenzaprine (FLEXERIL) 10 MG tablet, traMADol (ULTRAM) 50 MG tablet ? ?Plan: ?Stretches/exercises, heat, ice, Tylenol, NSAIDs. Tramadol for breakthrough pain. Flexeril prn. Does not get drowsy from either of these meds.  ?F/u prn. ?The patient voiced understanding and agreement to the plan. ? ? ?Jilda Roche [place], DO ?05/20/21  ?10:44 AM ? ?

## 2021-06-02 ENCOUNTER — Ambulatory Visit: Payer: BC Managed Care – PPO | Admitting: Family Medicine

## 2021-06-03 ENCOUNTER — Other Ambulatory Visit: Payer: Self-pay | Admitting: Family Medicine

## 2021-06-03 DIAGNOSIS — F324 Major depressive disorder, single episode, in partial remission: Secondary | ICD-10-CM

## 2021-06-03 DIAGNOSIS — F411 Generalized anxiety disorder: Secondary | ICD-10-CM

## 2021-06-16 ENCOUNTER — Ambulatory Visit: Payer: BC Managed Care – PPO | Admitting: Family Medicine

## 2021-06-17 ENCOUNTER — Telehealth (INDEPENDENT_AMBULATORY_CARE_PROVIDER_SITE_OTHER): Payer: BC Managed Care – PPO | Admitting: Family Medicine

## 2021-06-17 ENCOUNTER — Encounter: Payer: Self-pay | Admitting: Family Medicine

## 2021-06-17 DIAGNOSIS — F411 Generalized anxiety disorder: Secondary | ICD-10-CM | POA: Diagnosis not present

## 2021-06-17 DIAGNOSIS — F324 Major depressive disorder, single episode, in partial remission: Secondary | ICD-10-CM | POA: Diagnosis not present

## 2021-06-17 MED ORDER — ZIPRASIDONE HCL 40 MG PO CAPS: 40.0000 mg | ORAL_CAPSULE | Freq: Two times a day (BID) | ORAL | 2 refills | Status: DC

## 2021-06-17 NOTE — Progress Notes (Signed)
Chief Complaint  ?Patient presents with  ? Follow-up  ?  Discuss medication for depression  ? ? ?Subjective ?Luis Boyer presents for f/u depression/Bipolar disorder. Due to COVID-19 pandemic, we are interacting via web portal for an electronic face-to-face visit. I verified patient's ID using 2 identifiers. Patient agreed to proceed with visit via this method. Patient is at home, I am at office. Patient and I are present for visit.  ? ?Pt is currently being treated with Abilify 5 mg/d, Zoloft 100 mg/d, Atarax 25-75 mg TID prn.  ?Reports being more irritable lately over past few months. Having racing thoughts, insomnia, anxiety.  ?Depressive s/s's OK, anxiety more bothersome.  ?No thoughts of harming self or others. ?No self-medication with alcohol, prescription drugs or illicit drugs. ?Pt is not following with a counselor/psychologist. ? ?Past Medical History:  ?Diagnosis Date  ? ADD (attention deficit disorder)   ? Asthma   ? Bipolar 1 disorder (HCC)   ? Diabetes mellitus type 2 in obese (HCC) 08/02/2016  ? Hypertension   ? Obesity   ? ?Allergies as of 06/17/2021   ? ?   Reactions  ? Celebrex [celecoxib] Shortness Of Breath  ? Ace Inhibitors Other (See Comments)  ? angioedema  ? ?  ? ?  ?Medication List  ?  ? ?  ? Accurate as of June 17, 2021 10:59 AM. If you have any questions, ask your nurse or doctor.  ?  ?  ? ?  ? ?STOP taking these medications   ? ?ARIPiprazole 5 MG tablet ?Commonly known as: ABILIFY ?Stopped by: Sharlene Dory, DO ?  ? ?  ? ?TAKE these medications   ? ?albuterol 108 (90 Base) MCG/ACT inhaler ?Commonly known as: VENTOLIN HFA ?Inhale 1-2 puffs into the lungs every 6 (six) hours as needed for wheezing. ?  ?amLODipine 5 MG tablet ?Commonly known as: NORVASC ?Take 1 tablet (5 mg total) by mouth daily. ?  ?carvedilol 12.5 MG tablet ?Commonly known as: COREG ?Take 1 tablet (12.5 mg total) by mouth 2 (two) times daily with a meal. ?  ?chlorthalidone 25 MG tablet ?Commonly known as:  HYGROTON ?Take 1 tablet (25 mg total) by mouth daily. ?  ?cyclobenzaprine 10 MG tablet ?Commonly known as: FLEXERIL ?Take 0.5-1 tablets (5-10 mg total) by mouth 3 (three) times daily as needed for muscle spasms. ?  ?EQ Allergy Relief 10 MG tablet ?Generic drug: loratadine ?Take 1 tablet by mouth once daily ?  ?fluticasone 50 MCG/ACT nasal spray ?Commonly known as: FLONASE ?Place 2 sprays into both nostrils daily. ?  ?glyBURIDE 5 MG tablet ?Commonly known as: DIABETA ?Take 1 tablet (5 mg total) by mouth daily with breakfast. ?  ?hydrOXYzine 25 MG capsule ?Commonly known as: VISTARIL ?Take 1-3 capsules (25-75 mg total) by mouth every 8 (eight) hours as needed. ?  ?metFORMIN 500 MG tablet ?Commonly known as: GLUCOPHAGE ?TAKE 2 TABLETS BY MOUTH TWICE DAILY WITH A MEAL ?  ?multivitamin with minerals Tabs tablet ?Take 1 tablet by mouth daily. ?  ?sertraline 100 MG tablet ?Commonly known as: ZOLOFT ?Take 1 tablet by mouth once daily ?  ?ziprasidone 40 MG capsule ?Commonly known as: GEODON ?Take 1 capsule (40 mg total) by mouth 2 (two) times daily with a meal. ?Started by: Sharlene Dory, DO ?  ? ?  ? ? ?Exam ?No conversational dyspnea ?Age appropriate judgment and insight ?Nml affect and mood ? ?Assessment and Plan ? ?GAD (generalized anxiety disorder) ? ?Depression, major, single episode,  in partial remission (HCC) ? ?Chronic, not controlled.  Continue sertraline 100 mg daily and Atarax as needed.  Change Abilify to Geodon 40 mg twice daily.  Given history of bipolar disorder, would prefer not to adjust serotonin affecting medications.  Could add buspirone or changing to Zyprexa if no improvement. ?F/u in 1 mo. ?The patient voiced understanding and agreement to the plan. ? ?Sharlene Dory, DO ?06/17/21 ?10:59 AM ? ?

## 2021-07-03 ENCOUNTER — Ambulatory Visit: Payer: BC Managed Care – PPO | Admitting: Family Medicine

## 2021-07-08 ENCOUNTER — Other Ambulatory Visit: Payer: Self-pay | Admitting: Family Medicine

## 2021-07-08 DIAGNOSIS — F324 Major depressive disorder, single episode, in partial remission: Secondary | ICD-10-CM

## 2021-07-08 DIAGNOSIS — F411 Generalized anxiety disorder: Secondary | ICD-10-CM

## 2021-08-04 ENCOUNTER — Other Ambulatory Visit: Payer: Self-pay | Admitting: Family Medicine

## 2021-08-04 DIAGNOSIS — F411 Generalized anxiety disorder: Secondary | ICD-10-CM

## 2021-08-04 DIAGNOSIS — F324 Major depressive disorder, single episode, in partial remission: Secondary | ICD-10-CM

## 2021-08-12 ENCOUNTER — Telehealth (INDEPENDENT_AMBULATORY_CARE_PROVIDER_SITE_OTHER): Payer: BC Managed Care – PPO | Admitting: Family Medicine

## 2021-08-12 ENCOUNTER — Encounter: Payer: Self-pay | Admitting: Family Medicine

## 2021-08-12 DIAGNOSIS — R0683 Snoring: Secondary | ICD-10-CM | POA: Diagnosis not present

## 2021-08-12 DIAGNOSIS — R0681 Apnea, not elsewhere classified: Secondary | ICD-10-CM

## 2021-08-12 NOTE — Progress Notes (Signed)
Chief Complaint  Patient presents with   Insomnia    Asking for sleep study to be ordered    Subjective: Patient is a 31 y.o. male here for sleep issues. Due to COVID-19 pandemic, we are interacting via web portal for an electronic face-to-face visit. I verified patient's ID using 2 identifiers. Patient agreed to proceed with visit via this method. Patient is at home, I am at office. Patient and I are present for visit.   Pt has had issues sleeping, snoring and he stops breathing at night. He does not feel well rested. He will wake up gasping for air, sometimes with headaches and a dry mouth. Naps in afternoon. Falls asleep easily during day.   Past Medical History:  Diagnosis Date   ADD (attention deficit disorder)    Asthma    Bipolar 1 disorder (HCC)    Diabetes mellitus type 2 in obese (HCC) 08/02/2016   Hypertension    Obesity     Objective: No conversational dyspnea Age appropriate judgment and insight Nml affect and mood  Assessment and Plan: Snoring - Plan: Ambulatory referral to Pulmonology  Witnessed apneic spells - Plan: Ambulatory referral to Pulmonology  Refer to sleep team. [facility] HOB. Counseled on diet/exercise. F/u in 1 week for CPE and DM eval.  The patient voiced understanding and agreement to the plan.  Jilda Roche [place], DO 08/12/21  11:20 AM

## 2021-08-19 ENCOUNTER — Encounter: Payer: Self-pay | Admitting: Family Medicine

## 2021-08-19 ENCOUNTER — Telehealth: Payer: BC Managed Care – PPO | Admitting: Physician Assistant

## 2021-08-19 DIAGNOSIS — B9689 Other specified bacterial agents as the cause of diseases classified elsewhere: Secondary | ICD-10-CM

## 2021-08-19 DIAGNOSIS — J019 Acute sinusitis, unspecified: Secondary | ICD-10-CM

## 2021-08-20 MED ORDER — AMOXICILLIN-POT CLAVULANATE 875-125 MG PO TABS: 1.0000 | ORAL_TABLET | Freq: Two times a day (BID) | ORAL | 0 refills | Status: DC

## 2021-08-20 NOTE — Progress Notes (Signed)
I have spent 5 minutes in review of e-visit questionnaire, review and updating patient chart, medical decision making and response to patient.   Luis Boyer Cody Harlee Eckroth, PA-C    

## 2021-08-20 NOTE — Progress Notes (Signed)

## 2021-09-04 ENCOUNTER — Other Ambulatory Visit: Payer: Self-pay | Admitting: Family Medicine

## 2021-09-04 DIAGNOSIS — E1169 Type 2 diabetes mellitus with other specified complication: Secondary | ICD-10-CM

## 2021-09-04 DIAGNOSIS — F411 Generalized anxiety disorder: Secondary | ICD-10-CM

## 2021-09-04 DIAGNOSIS — F324 Major depressive disorder, single episode, in partial remission: Secondary | ICD-10-CM

## 2021-09-07 ENCOUNTER — Ambulatory Visit (INDEPENDENT_AMBULATORY_CARE_PROVIDER_SITE_OTHER): Payer: BC Managed Care – PPO | Admitting: Adult Health

## 2021-09-07 ENCOUNTER — Encounter: Payer: Self-pay | Admitting: Adult Health

## 2021-09-07 VITALS — BP 130/68 | HR 99 | Temp 98.2°F | Ht 72.0 in | Wt 376.2 lb

## 2021-09-07 DIAGNOSIS — R0683 Snoring: Secondary | ICD-10-CM

## 2021-09-07 NOTE — Patient Instructions (Signed)
Set up for home sleep study  Healthy sleep regimen  Do not drive if sleepy  Work on healthy weight loss  Follow up in 6 weeks to discuss results and treatment plan

## 2021-09-07 NOTE — Progress Notes (Signed)
@Patient  ID: Luis Boyer, male    DOB: [birth year], 31 y.o.   MRN: [id number]  Chief Complaint  Patient presents with   Consult    Referring provider: Shelda Pal*  HPI: 31 year old male seen for sleep consult in September 07, 2021 for snoring, daytime sleepiness, restless sleep.  TEST/EVENTS :   09/07/2021 Sleep Consult  Patient presents for sleep consult today.  Kindly referred by Dr, Nani Ravens.  Patient complains of a long time is snoring, daytime sleepiness, fatigue, restless sleep and intermittent episodes of gasping for air during his sleep.  Patient says he has a hard time falling asleep.  Can take several hours to go to sleep.  Typically gets up several times throughout the night.  Typically goes to bed about 3 AM.  Gets up about 8 AM.  Patient operates a forklift at work.  Patient's weight has been up and down over the last couple years.  Current weight is at 376 pounds with a BMI at 51.  Is never had a previous sleep study.  Patient says he has chronic insomnia.  Has tried multiple medicines in the past including melatonin, trazodone, Restoril without much success.  Takes in about 2 sodas daily.  Has no history of congestive heart failure or stroke. Epworth score is 12 out of 24.  Patient typically gets sleepy if he is sitting still watching TV or in the afternoon hours.  Social history patient is married.  Has no children.  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  He is a never smoker.  No alcohol or drugs.  Family history positive for emphysema, allergies, asthma, blood clots, rheumatoid arthritis and lung cancer.  Surgical history none  Medical history significant for hypertension asthma diabetes, hyperlipidemia, heat stroke, chronic allergies, bipolar depression.     Allergies  Allergen Reactions   Celebrex [Celecoxib] Shortness Of Breath   Ace Inhibitors Other (See Comments)    angioedema    Immunization History  Administered Date(s) Administered    Influenza Split 12/20/2011   Influenza Whole 01/10/2007   Influenza,inj,Quad PF,6+ Mos 01/11/2014, 12/15/2016, 01/03/2019, 01/07/2020   Influenza-Unspecified 12/20/2011, 01/11/2014, 12/15/2016   Pneumococcal Polysaccharide-23 06/14/2016   Tdap 08/24/2011, 09/14/2014    Past Medical History:  Diagnosis Date   ADD (attention deficit disorder)    Asthma    Bipolar 1 disorder ([place])    Diabetes mellitus type 2 in obese ([place]) 08/02/2016   Hypertension    Obesity     Tobacco History: Social History   Tobacco Use  Smoking Status Never   Passive exposure: Past  Smokeless Tobacco Never   Counseling given: Not Answered   Outpatient Medications Prior to Visit  Medication Sig Dispense Refill   amLODipine (NORVASC) 5 MG tablet Take 1 tablet (5 mg total) by mouth daily. 30 tablet 5   metFORMIN (GLUCOPHAGE) 500 MG tablet TAKE 2 TABLETS BY MOUTH TWICE DAILY WITH A MEAL 120 tablet 5   Multiple Vitamin (MULTIVITAMIN WITH MINERALS) TABS tablet Take 1 tablet by mouth daily.     sertraline (ZOLOFT) 100 MG tablet Take 1 tablet by mouth once daily 30 tablet 0   albuterol (VENTOLIN HFA) 108 (90 Base) MCG/ACT inhaler Inhale 1-2 puffs into the lungs every 6 (six) hours as needed for wheezing. 1 each 0   chlorthalidone (HYGROTON) 25 MG tablet Take 1 tablet (25 mg total) by mouth daily. 30 tablet 5   EQ ALLERGY RELIEF 10 MG tablet Take 1 tablet by mouth once  daily 30 tablet 0   glyBURIDE (DIABETA) 5 MG tablet Take 1 tablet by mouth once daily with breakfast 30 tablet 0   ziprasidone (GEODON) 40 MG capsule Take 1 capsule (40 mg total) by mouth 2 (two) times daily with a meal. 60 capsule 2   amoxicillin-clavulanate (AUGMENTIN) 875-125 MG tablet Take 1 tablet by mouth 2 (two) times daily. 14 tablet 0   carvedilol (COREG) 12.5 MG tablet Take 1 tablet (12.5 mg total) by mouth 2 (two) times daily with a meal. 60 tablet 3   cyclobenzaprine (FLEXERIL) 10 MG tablet Take 0.5-1 tablets (5-10 mg total) by mouth 3  (three) times daily as needed for muscle spasms. 21 tablet 0   fluticasone (FLONASE) 50 MCG/ACT nasal spray Place 2 sprays into both nostrils daily. 16 g 6   hydrOXYzine (VISTARIL) 25 MG capsule Take 1-3 capsules (25-75 mg total) by mouth every 8 (eight) hours as needed. 30 capsule 0   No facility-administered medications prior to visit.     Review of Systems:   Constitutional:   No  weight loss, night sweats,  Fevers, chills,  +fatigue, or  lassitude.  HEENT:   No headaches,  Difficulty swallowing,  Tooth/dental problems, or  Sore throat,                No sneezing, itching, ear ache, nasal congestion, post nasal drip,   CV:  No chest pain,  Orthopnea, PND, swelling in lower extremities, anasarca, dizziness, palpitations, syncope.   GI  No heartburn, indigestion, abdominal pain, nausea, vomiting, diarrhea, change in bowel habits, loss of appetite, bloody stools.   Resp: No shortness of breath with exertion or at rest.  No excess mucus, no productive cough,  No non-productive cough,  No coughing up of blood.  No change in color of mucus.  No wheezing.  No chest wall deformity  Skin: no rash or lesions.  GU: no dysuria, change in color of urine, no urgency or frequency.  No flank pain, no hematuria   MS:  No joint pain or swelling.  No decreased range of motion.  No back pain.    Physical Exam  BP 130/68 (BP Location: Left Arm, Patient Position: Sitting, Cuff Size: Large)   Pulse 99   Temp 98.2 F (36.8 C) (Oral)   Ht 6' (1.829 m)   Wt (!) 376 lb 3.2 oz (170.6 kg)   SpO2 97%   BMI 51.02 kg/m   GEN: A/Ox3; pleasant , NAD, well nourished    HEENT:  [place]/AT,  NOSE-clear, THROAT-clear, no lesions, no postnasal drip or exudate noted. Class 3 MP airway   NECK:  Supple w/ fair ROM; no JVD; normal carotid impulses w/o bruits; no thyromegaly or nodules palpated; no lymphadenopathy.    RESP  Clear  P & A; w/o, wheezes/ rales/ or rhonchi. no accessory muscle use, no dullness to  percussion  CARD:  RRR, no m/r/g, no peripheral edema, pulses intact, no cyanosis or clubbing.  GI:   Soft & nt; nml bowel sounds; no organomegaly or masses detected.   Musco: Warm bil, no deformities or joint swelling noted.   Neuro: alert, no focal deficits noted.    Skin: Warm, no lesions or rashes    Lab Results:  CBC   BMET   BNP No results found for: "BNP"   Imaging: No results found.        No data to display          No results found  for: "NITRICOXIDE"      Assessment & Plan:   No problem-specific Assessment & Plan notes found for this encounter.     Rubye Oaks, NP 09/07/2021

## 2021-09-09 ENCOUNTER — Ambulatory Visit (INDEPENDENT_AMBULATORY_CARE_PROVIDER_SITE_OTHER): Payer: BC Managed Care – PPO | Admitting: Family Medicine

## 2021-09-09 ENCOUNTER — Encounter: Payer: Self-pay | Admitting: Family Medicine

## 2021-09-09 VITALS — BP 130/84 | HR 100 | Temp 99.1°F | Ht 72.0 in | Wt 375.5 lb

## 2021-09-09 DIAGNOSIS — E1165 Type 2 diabetes mellitus with hyperglycemia: Secondary | ICD-10-CM | POA: Diagnosis not present

## 2021-09-09 DIAGNOSIS — Z1159 Encounter for screening for other viral diseases: Secondary | ICD-10-CM | POA: Diagnosis not present

## 2021-09-09 DIAGNOSIS — I10 Essential (primary) hypertension: Secondary | ICD-10-CM

## 2021-09-09 DIAGNOSIS — E010 Iodine-deficiency related diffuse (endemic) goiter: Secondary | ICD-10-CM

## 2021-09-09 MED ORDER — OZEMPIC (0.25 OR 0.5 MG/DOSE) 2 MG/1.5ML ~~LOC~~ SOPN: PEN_INJECTOR | SUBCUTANEOUS | 0 refills | Status: DC

## 2021-09-09 NOTE — Progress Notes (Signed)
Subjective:   Chief Complaint  Patient presents with   Follow-up    Luis Boyer is a 31 y.o. male here for follow-up of diabetes.   Luis Boyer does not routinely monitor his sugars.  Patient does not require insulin.   Medications include: metformin 1000 mg bid, glyburide 5 mg/d Diet is fair.  Exercise: walking, active in yard  Hypertension Patient presents for hypertension follow up. He does monitor home blood pressures. Blood pressures ranging on average from 130's/80's. He is compliant with medications- Norvasc 5 mg/d, chlorthalidone 25 mg/d.  Patient has these side effects of medication: none No CP or SOB.   Past Medical History:  Diagnosis Date   ADD (attention deficit disorder)    Asthma    Bipolar 1 disorder (HCC)    Diabetes mellitus type 2 in obese (HCC) 08/02/2016   Hypertension    Obesity      Related testing: Retinal exam: Done Pneumovax: done  Objective:  BP 130/84 (BP Location: Left Arm, Cuff Size: Large)   Pulse 100   Temp 99.1 F (37.3 C) (Oral)   Ht 6' (1.829 m)   Wt (!) 375 lb 8 oz (170.3 kg)   SpO2 98%   BMI 50.93 kg/m  General:  Well developed, well nourished, in no apparent distress Skin:  Warm, no pallor or diaphoresis Head:  Normocephalic, atraumatic Eyes:  Pupils equal and round, sclera anicteric without injection  Neck: +thyromegaly. No ttp or erythema or fluctuance Lungs:  CTAB, no access msc use Cardio:  RRR, no bruits, no LE edema Musculoskeletal:  Symmetrical muscle groups noted without atrophy or deformity Neuro:  Sensation intact to pinprick on feet Psych: Age appropriate judgment and insight  Assessment:   Type 2 diabetes mellitus with hyperglycemia, unspecified whether long term insulin use (HCC) - Plan: Comprehensive metabolic panel, Lipid panel, Hemoglobin A1c, Microalbumin / creatinine urine ratio, Semaglutide,0.25 or 0.5MG /DOS, (OZEMPIC, 0.25 OR 0.5 MG/DOSE,) 2 MG/1.5ML SOPN  Essential hypertension  Morbid obesity  (HCC)  Encounter for hepatitis C screening test for low risk patient - Plan: Hepatitis C antibody  Thyromegaly - Plan: US THYROID, TSH, T4, free   Plan:   Chronic, unsure if stable. Weight is quite high. Unsure if sugars are controlled but they weren't in the past when he weighed this much. Will send in 0.25 mg weekly of Ozempic and follow up pending results. Counseled on diet and exercise. Chronic, stable. Cont Norvasc 5 mg/d and chlorthalidone 25 mg/d as he has had anaphylaxis w ACEi.  He has a goiter on exam. Will ck TSH/T4 and order an US of the thyroid.  F/u pending above. The patient voiced understanding and agreement to the plan.  Jilda Roche [place], DO 09/09/21 9:18 PM

## 2021-09-09 NOTE — Patient Instructions (Signed)
Give [place] 2-3 business days to get the results of your labs back.   Keep the diet clean and stay active.  Don't fill the shot if it is too expensive.   Let us know if you need anything.

## 2021-09-10 ENCOUNTER — Encounter: Payer: Self-pay | Admitting: Family Medicine

## 2021-09-10 LAB — LIPID PANEL
Cholesterol: 173 mg/dL (ref 0–200)
HDL: 37 mg/dL — ABNORMAL LOW (ref >39.00)
NonHDL: 136.02
Total CHOL/HDL Ratio: 5
Triglycerides: 218 mg/dL — ABNORMAL HIGH (ref 0.0–149.0)
VLDL: 43.6 mg/dL — ABNORMAL HIGH (ref 0.0–40.0)

## 2021-09-10 LAB — COMPREHENSIVE METABOLIC PANEL
ALT: 29 U/L (ref 0–53)
AST: 15 U/L (ref 0–37)
Albumin: 4.6 g/dL (ref 3.5–5.2)
Alkaline Phosphatase: 86 U/L (ref 39–117)
BUN: 9 mg/dL (ref 6–23)
CO2: 25 mEq/L (ref 19–32)
Calcium: 9.7 mg/dL (ref 8.4–10.5)
Chloride: 102 mEq/L (ref 96–112)
Creatinine, Ser: 0.63 mg/dL (ref 0.40–1.50)
GFR: 126.45 mL/min (ref >60.00)
Glucose, Bld: 248 mg/dL — ABNORMAL HIGH (ref 70–99)
Potassium: 4.1 mEq/L (ref 3.5–5.1)
Sodium: 137 mEq/L (ref 135–145)
Total Bilirubin: 0.5 mg/dL (ref 0.2–1.2)
Total Protein: 7 g/dL (ref 6.0–8.3)

## 2021-09-10 LAB — MICROALBUMIN / CREATININE URINE RATIO
Creatinine,U: 116.2 mg/dL
Microalb Creat Ratio: 3.4 mg/g (ref 0.0–30.0)
Microalb, Ur: 4 mg/dL — ABNORMAL HIGH (ref 0.0–1.9)

## 2021-09-10 LAB — TSH: TSH: 0.06 u[IU]/mL — ABNORMAL LOW (ref 0.35–5.50)

## 2021-09-10 LAB — LDL CHOLESTEROL, DIRECT: Direct LDL: 113 mg/dL

## 2021-09-10 LAB — HEMOGLOBIN A1C: Hgb A1c MFr Bld: 10.7 % — ABNORMAL HIGH (ref 4.6–6.5)

## 2021-09-10 LAB — HEPATITIS C ANTIBODY: Hepatitis C Ab: NONREACTIVE

## 2021-09-10 LAB — T4, FREE: Free T4: 0.86 ng/dL (ref 0.60–1.60)

## 2021-09-11 ENCOUNTER — Telehealth: Payer: Self-pay | Admitting: Family Medicine

## 2021-09-11 ENCOUNTER — Ambulatory Visit (HOSPITAL_BASED_OUTPATIENT_CLINIC_OR_DEPARTMENT_OTHER): Payer: BC Managed Care – PPO

## 2021-09-11 NOTE — Telephone Encounter (Signed)
Initiated PA for Ozempic KEY:  [id number] Waiting response

## 2021-09-11 NOTE — Telephone Encounter (Signed)
Spoke with patient and advised that his insurance is requiring prior auth and we are awaiting on determination.  He will wait for determination.  Price that was given and pharmacy was without insurance.

## 2021-09-12 ENCOUNTER — Telehealth: Payer: BC Managed Care – PPO | Admitting: Nurse Practitioner

## 2021-09-12 DIAGNOSIS — J0101 Acute recurrent maxillary sinusitis: Secondary | ICD-10-CM | POA: Diagnosis not present

## 2021-09-12 MED ORDER — AMOXICILLIN-POT CLAVULANATE 875-125 MG PO TABS: 1.0000 | ORAL_TABLET | Freq: Two times a day (BID) | ORAL | 0 refills | Status: DC

## 2021-09-12 NOTE — Progress Notes (Signed)

## 2021-09-13 DIAGNOSIS — R0683 Snoring: Secondary | ICD-10-CM | POA: Insufficient documentation

## 2021-09-13 NOTE — Assessment & Plan Note (Signed)
Snoring, sleepiness, restless sleep, BMI 51 all suspicious for underlying sleep apnea Patient education given on sleep apnea We will need to proceed with a home sleep study  - discussed how weight can impact sleep and risk for sleep disordered breathing - discussed options to assist with weight loss: combination of diet modification, cardiovascular and strength training exercises   - had an extensive discussion regarding the adverse health consequences related to untreated sleep disordered breathing - specifically discussed the risks for hypertension, coronary artery disease, cardiac dysrhythmias, cerebrovascular disease, and diabetes - lifestyle modification discussed   - discussed how sleep disruption can increase risk of accidents, particularly when driving - safe driving practices were discussed   Plan  Patient Instructions  Set up for home sleep study  Healthy sleep regimen  Do not drive if sleepy  Work on healthy weight loss  Follow up in 6 weeks to discuss results and treatment plan

## 2021-09-14 ENCOUNTER — Ambulatory Visit: Payer: BC Managed Care – PPO | Admitting: Family Medicine

## 2021-09-14 MED ORDER — PIOGLITAZONE HCL 30 MG PO TABS: 30.0000 mg | ORAL_TABLET | Freq: Every day | ORAL | 2 refills | Status: DC

## 2021-09-14 NOTE — Telephone Encounter (Signed)
Received denial from Cover[employer].

## 2021-09-14 NOTE — Telephone Encounter (Signed)
Any alternatives provided?

## 2021-09-14 NOTE — Telephone Encounter (Signed)
Have not received anything yet.

## 2021-09-18 ENCOUNTER — Ambulatory Visit (HOSPITAL_BASED_OUTPATIENT_CLINIC_OR_DEPARTMENT_OTHER): Payer: BC Managed Care – PPO

## 2021-09-25 ENCOUNTER — Telehealth (HOSPITAL_BASED_OUTPATIENT_CLINIC_OR_DEPARTMENT_OTHER): Payer: Self-pay

## 2021-09-25 ENCOUNTER — Ambulatory Visit (HOSPITAL_BASED_OUTPATIENT_CLINIC_OR_DEPARTMENT_OTHER): Payer: BC Managed Care – PPO

## 2021-09-28 ENCOUNTER — Other Ambulatory Visit: Payer: Self-pay | Admitting: Family Medicine

## 2021-09-28 DIAGNOSIS — E669 Obesity, unspecified: Secondary | ICD-10-CM

## 2021-09-28 DIAGNOSIS — E1169 Type 2 diabetes mellitus with other specified complication: Secondary | ICD-10-CM

## 2021-09-28 DIAGNOSIS — F324 Major depressive disorder, single episode, in partial remission: Secondary | ICD-10-CM

## 2021-09-28 DIAGNOSIS — F411 Generalized anxiety disorder: Secondary | ICD-10-CM

## 2021-10-09 ENCOUNTER — Ambulatory Visit: Payer: BC Managed Care – PPO | Admitting: Family Medicine

## 2021-10-26 ENCOUNTER — Ambulatory Visit: Payer: BC Managed Care – PPO | Admitting: Family Medicine

## 2021-10-28 ENCOUNTER — Encounter: Payer: Self-pay | Admitting: Adult Health

## 2021-11-13 ENCOUNTER — Ambulatory Visit: Payer: BC Managed Care – PPO | Admitting: Family Medicine

## 2021-11-13 ENCOUNTER — Ambulatory Visit: Payer: BC Managed Care – PPO

## 2021-11-13 ENCOUNTER — Telehealth: Payer: Self-pay | Admitting: Adult Health

## 2021-11-13 DIAGNOSIS — R0683 Snoring: Secondary | ICD-10-CM

## 2021-11-13 DIAGNOSIS — G4733 Obstructive sleep apnea (adult) (pediatric): Secondary | ICD-10-CM | POA: Diagnosis not present

## 2021-11-13 NOTE — Telephone Encounter (Signed)
See pt e-mail

## 2021-11-13 NOTE — Telephone Encounter (Signed)
Tammy, please advise on pt's message regarding a sleep aid. Below is information from your last note from 09/07/21. Thanks.    Patient complains of a long time is snoring, daytime sleepiness, fatigue, restless sleep and intermittent episodes of gasping for air during his sleep.  Patient says he has a hard time falling asleep.  Can take several hours to go to sleep.  Typically gets up several times throughout the night.  Typically goes to bed about 3 AM.  Gets up about 8 AM.  Patient operates a forklift at work.    Has tried multiple medicines in the past including melatonin, trazodone, Restoril without much success.

## 2021-11-16 NOTE — Telephone Encounter (Signed)
Sorry to hear that . He can try Melatonin At bedtime  . I do not give sleep aides . Need to make sure no Sleep apnea, as sleep aides can cause harm with OSA.  When is his Sleep study?? Should have already had this can we check with CC .

## 2021-11-19 NOTE — Telephone Encounter (Signed)
Hi patient had hst done on the 11/13/21 Report is still with Dr Wynona Neat to read. Pt is Aware results takes 2/wks

## 2021-11-19 NOTE — Telephone Encounter (Signed)
Sleep study has already been completed. Will forward to PCCs to check on when test will be uploaded.

## 2021-11-23 ENCOUNTER — Telehealth: Payer: Self-pay | Admitting: Pulmonary Disease

## 2021-11-23 DIAGNOSIS — G4733 Obstructive sleep apnea (adult) (pediatric): Secondary | ICD-10-CM | POA: Diagnosis not present

## 2021-11-23 NOTE — Telephone Encounter (Signed)
Call patient  Sleep study result  Date of study: 11/15/2021  Impression: Mild obstructive sleep apnea  Recommendation: Options of treatment for mild obstructive sleep apnea will include  1.  CPAP therapy if there is significant daytime sleepiness or other comorbidities including history of CVA or cardiac disease  -If CPAP is chosen as an option of treatment auto titrating CPAP with a pressure setting of 5-15 will be appropriate  2.  Watchful waiting with emphasis on weight loss measures, sleep position modification to optimize lateral sleep, elevating the head of the bed by about 30 degrees may also help.  3.  An oral device may be fashioned for the treatment of mild sleep disordered breathing, will involve referral to dentist.  Follow-up as previously scheduled

## 2021-11-25 NOTE — Telephone Encounter (Signed)
I left a message for the patient to call back for an office visit for his sleep study results. Waiting on call back

## 2021-12-04 ENCOUNTER — Ambulatory Visit: Payer: BC Managed Care – PPO | Admitting: Family Medicine

## 2021-12-07 ENCOUNTER — Telehealth: Payer: Self-pay | Admitting: Adult Health

## 2021-12-07 NOTE — Telephone Encounter (Signed)
Please set up an office or virtual visit to discuss sleep study results in detail and treatment plan

## 2021-12-07 NOTE — Telephone Encounter (Signed)
-----   Message from Luis Boyer sent at 11/24/2021  3:12 PM EDT ----- Hello this patient's HST report is ready for review

## 2021-12-11 NOTE — Telephone Encounter (Signed)
He has an OV coming up to go over his sleep study.

## 2021-12-11 NOTE — Telephone Encounter (Signed)
Scheduled for 10/20 at 9:30 am.  Nothing further needed.

## 2021-12-25 ENCOUNTER — Ambulatory Visit: Payer: BC Managed Care – PPO | Admitting: Adult Health

## 2021-12-27 ENCOUNTER — Other Ambulatory Visit: Payer: Self-pay | Admitting: Family Medicine

## 2021-12-27 DIAGNOSIS — F411 Generalized anxiety disorder: Secondary | ICD-10-CM

## 2021-12-27 DIAGNOSIS — F324 Major depressive disorder, single episode, in partial remission: Secondary | ICD-10-CM

## 2021-12-27 DIAGNOSIS — E1169 Type 2 diabetes mellitus with other specified complication: Secondary | ICD-10-CM

## 2021-12-30 ENCOUNTER — Encounter: Payer: Self-pay | Admitting: Family Medicine

## 2021-12-30 ENCOUNTER — Other Ambulatory Visit: Payer: Self-pay | Admitting: Family Medicine

## 2021-12-30 DIAGNOSIS — F411 Generalized anxiety disorder: Secondary | ICD-10-CM

## 2021-12-30 DIAGNOSIS — E1169 Type 2 diabetes mellitus with other specified complication: Secondary | ICD-10-CM

## 2021-12-30 DIAGNOSIS — F324 Major depressive disorder, single episode, in partial remission: Secondary | ICD-10-CM

## 2021-12-30 DIAGNOSIS — E669 Obesity, unspecified: Secondary | ICD-10-CM

## 2021-12-31 MED ORDER — GLYBURIDE 5 MG PO TABS: 5.0000 mg | ORAL_TABLET | Freq: Every day | ORAL | 0 refills | Status: DC

## 2021-12-31 MED ORDER — SERTRALINE HCL 100 MG PO TABS: 100.0000 mg | ORAL_TABLET | Freq: Every day | ORAL | 0 refills | Status: DC

## 2021-12-31 MED ORDER — METFORMIN HCL 500 MG PO TABS: 1000.0000 mg | ORAL_TABLET | Freq: Two times a day (BID) | ORAL | 0 refills | Status: DC

## 2022-01-11 ENCOUNTER — Ambulatory Visit: Payer: BC Managed Care – PPO | Admitting: Adult Health

## 2022-01-22 ENCOUNTER — Ambulatory Visit: Payer: BC Managed Care – PPO | Admitting: Family Medicine

## 2022-01-26 ENCOUNTER — Telehealth: Payer: BC Managed Care – PPO | Admitting: Physician Assistant

## 2022-01-26 DIAGNOSIS — M545 Low back pain, unspecified: Secondary | ICD-10-CM

## 2022-01-26 NOTE — Progress Notes (Signed)
Because of severity of pain (9/10) and inability to engage in usual activities due to level of pain, I feel your condition warrants further evaluation and I recommend that you be seen in a face to face visit.   NOTE: There will be NO CHARGE for this eVisit   If you are having a true medical emergency please call 911.      For an urgent face to face visit, [place] has seven urgent care centers for your convenience:     [facility] Health Ur[facility] Care Center at [facility] Directions [phone] [address] [place], [place] [address]    [facility] Health Urgent Care Center [facility]) Get Driving Directions [phone] [address] [place], [place] [address]  [facility] Health Urgent Care Center [facility]) Get Driving Directions [phone] [address] [place],  [place]  [address]  [facility] Health Urgent Care Center [facility] - at [employer] Directions  [phone] [address] [place],  [place] [address]   [facility] Health Urgent Care at [facility] Get Driving Directions [phone] [address] [place], [place] [address]   [facility] Health Urgent Care at [facility] Get Driving Directions  [phone] [address] [place], [place] [address]   [facility] Health Urgent Care at [facility] Directions [phone] [address] [place], [place] [address]  Your MyChart E-visit questionnaire answers were reviewed by a board certified advanced clinical practitioner to complete your personal care plan based on your specific symptoms.  Thank you for using e-Visits.

## 2022-02-01 ENCOUNTER — Telehealth: Payer: BC Managed Care – PPO | Admitting: Physician Assistant

## 2022-02-01 DIAGNOSIS — N4889 Other specified disorders of penis: Secondary | ICD-10-CM

## 2022-02-02 ENCOUNTER — Ambulatory Visit: Payer: BC Managed Care – PPO | Admitting: Family Medicine

## 2022-02-02 NOTE — Progress Notes (Signed)
Message sent to patient requesting further input regarding current symptoms. Awaiting patient response.  

## 2022-02-02 NOTE — Progress Notes (Signed)
Because we cannot examine the area of concern via an e-visit and giving young age and uncircumcised state increasing risks for more complicated infections/issues, including paraphimosis (swelling of the foreskin causing it to strangulate the penis), I feel your condition warrants further evaluation and I recommend that you be seen in a face to face visit. We really want to make sure this is treated properly to avoid any complications or delay in resolution.    NOTE: There will be NO CHARGE for this eVisit   If you are having a true medical emergency please call 911.      For an urgent face to face visit, [place] has seven urgent care centers for your convenience:     [facility] Health Urgent Care Center at [facility] Directions [phone] [address] [place], [place] [address]    [facility] Health Urgent Care Center [facility]) Get Driving Directions [phone] [address] [place], [place] [address]  [facility] Health Urgent Care Center [facility]) Get Driving Directions [phone] [address] [place],  [place]  [address]  [facility] Health Urgent Care Center [facility] - at [employer] Directions  [phone] [address] [place],  [place] [address]   [facility] [place] Health Urgent Care at [facility] Get Driving Directions [phone] [address] [place], [place] [address]   [facility] Health Urgent Care at [facility] Get Driving Directions  [phone] [address] [place], [place] [address]   [facility] Health Urgent Care at [facility] Directions [phone] [address] [place], [place] [address]  Your MyChart E-visit questionnaire answers were reviewed by a board certified advanced clinical practitioner to complete your personal care plan based on your specific symptoms.  Thank you for using e-Visits.

## 2022-02-03 ENCOUNTER — Ambulatory Visit: Payer: BC Managed Care – PPO | Admitting: Adult Health

## 2022-02-04 ENCOUNTER — Encounter: Payer: Self-pay | Admitting: Family Medicine

## 2022-02-04 ENCOUNTER — Telehealth: Payer: BC Managed Care – PPO | Admitting: Physician Assistant

## 2022-02-04 DIAGNOSIS — N481 Balanitis: Secondary | ICD-10-CM

## 2022-02-04 DIAGNOSIS — N489 Disorder of penis, unspecified: Secondary | ICD-10-CM

## 2022-02-04 NOTE — Progress Notes (Signed)
Because of swelling, and lesions indicating a more severe infection that may require more than one medication/intervention for treatment, and as recommended at your last e-visit request for this issue, I feel your condition warrants further evaluation and I recommend that you be seen in a face to face visit.   NOTE: There will be NO CHARGE for this eVisit   If you are having a true medical emergency please call 911.      For an urgent face to face visit, [place] has seven urgent care centers for your convenience:     [facility] Health Urgent Care Center at [facility] Directions [phone] [address] [place], [place] [address]    [facility] Health Urgent Care Center [facility]) Get Driving Directions [phone] [address] [place], [place] [address]  [facility] Health Urgent Care Center [facility]) Get Driving Directions [phone] [address] [place],  [place]  [address]  [facility] Health Urgent Care Center [facility] - at [employer] Directions  [phone] [address] [place],  [place] [address]   [facility] Health Urgent Care at [facility] Get Driving Directions [phone] [address] [place], [place] [address]   [facility] Health Urgent Care at [facility] Get Driving Directions  [phone] [address] [place], [place] [address]   [facility] Health Urgent Care at [facility] Directions [phone] [address] [place], [place] [address]  Your MyChart E-visit questionnaire answers were reviewed by a board certified advanced clinical practitioner to complete your personal care plan based on your specific symptoms.  Thank you for using e-Visits.

## 2022-02-10 ENCOUNTER — Ambulatory Visit: Payer: BC Managed Care – PPO | Admitting: Family Medicine

## 2022-02-12 ENCOUNTER — Ambulatory Visit: Payer: BC Managed Care – PPO | Admitting: Adult Health

## 2022-02-22 ENCOUNTER — Ambulatory Visit: Payer: BC Managed Care – PPO | Admitting: Adult Health

## 2022-02-27 ENCOUNTER — Telehealth: Payer: BC Managed Care – PPO | Admitting: Physician Assistant

## 2022-02-27 DIAGNOSIS — R369 Urethral discharge, unspecified: Secondary | ICD-10-CM

## 2022-02-27 DIAGNOSIS — N4889 Other specified disorders of penis: Secondary | ICD-10-CM

## 2022-02-27 NOTE — Progress Notes (Signed)
Because of having recurrent issues and now with pain and discharge, I feel your condition warrants further evaluation and I recommend that you be seen in a face to face visit.   NOTE: There will be NO CHARGE for this eVisit   If you are having a true medical emergency please call 911.      For an urgent face to face visit, [place] has seven urgent care centers for your convenience:     [facility] at [facility] Center Directions [phone] [address] [place], [place] [address]    [facility] Health Urgent Care Center [facility]) Get Driving Directions [phone] [address], [place] [address]  [facility]) Get Driving Directions [phone] [address] [place],  [place]  [address]  [facility] - at [employer] Directions  [phone] [address] [place],  [place] [address]   [facility] Health Urgent Care at [facility] Get Driving Directions [phone] [address] [place], [place] [address]   [facility] Health Urgent Care at [facility] Get Driving Directions  [phone] [address] [place], [place] [address]   [facility] Health Ur[facility] at [facility] Directions [phone] [address] [place], [place] [address]  Your MyChart E-visit questionnaire answers were reviewed by a board certified advanced clinical practitioner to complete your personal care plan based on your specific symptoms.  Thank you for using e-Visits.   I have spent 5 minutes in review of e-visit questionnaire, review and updating patient chart, medical decision making and response to patient.   Margaretann Loveless, PA-C

## 2022-03-01 ENCOUNTER — Encounter: Payer: Self-pay | Admitting: Family Medicine

## 2022-03-02 ENCOUNTER — Encounter: Payer: Self-pay | Admitting: Family Medicine

## 2022-03-02 ENCOUNTER — Telehealth (INDEPENDENT_AMBULATORY_CARE_PROVIDER_SITE_OTHER): Payer: BC Managed Care – PPO | Admitting: Family Medicine

## 2022-03-02 DIAGNOSIS — F411 Generalized anxiety disorder: Secondary | ICD-10-CM | POA: Diagnosis not present

## 2022-03-02 DIAGNOSIS — N481 Balanitis: Secondary | ICD-10-CM

## 2022-03-02 DIAGNOSIS — E1169 Type 2 diabetes mellitus with other specified complication: Secondary | ICD-10-CM

## 2022-03-02 DIAGNOSIS — F324 Major depressive disorder, single episode, in partial remission: Secondary | ICD-10-CM | POA: Diagnosis not present

## 2022-03-02 DIAGNOSIS — E669 Obesity, unspecified: Secondary | ICD-10-CM

## 2022-03-02 DIAGNOSIS — I10 Essential (primary) hypertension: Secondary | ICD-10-CM | POA: Diagnosis not present

## 2022-03-02 MED ORDER — DOXEPIN HCL 6 MG PO TABS: ORAL_TABLET | ORAL | 2 refills | Status: DC

## 2022-03-02 MED ORDER — CLOTRIMAZOLE-BETAMETHASONE 1-0.05 % EX CREA: 1.0000 | TOPICAL_CREAM | Freq: Every day | CUTANEOUS | 0 refills | Status: DC

## 2022-03-02 MED ORDER — METFORMIN HCL 500 MG PO TABS: 1000.0000 mg | ORAL_TABLET | Freq: Two times a day (BID) | ORAL | 1 refills | Status: DC

## 2022-03-02 MED ORDER — AMLODIPINE BESYLATE 5 MG PO TABS: 5.0000 mg | ORAL_TABLET | Freq: Every day | ORAL | 3 refills | Status: DC

## 2022-03-02 MED ORDER — SERTRALINE HCL 100 MG PO TABS: 100.0000 mg | ORAL_TABLET | Freq: Every day | ORAL | 1 refills | Status: DC

## 2022-03-02 NOTE — Progress Notes (Signed)
Subjective:   Chief Complaint  Patient presents with   Diabetes    Follow up for refills   Depression    Follow up and refills    Luis Boyer is a 31 y.o. male here for follow-up of diabetes.  Due to COVID-19 pandemic, we are interacting via web portal for an electronic face-to-face visit. I verified patient's ID using 2 identifiers. Patient agreed to proceed with visit via this method. Patient is in a parked car, I am at office. Patient and I are present for visit.  Maria's self monitored glucose range is mid-high 100's nonfasting, low 100's fasting.  Patient denies hypoglycemic reactions. He checks his glucose levels 2 time(s) per day. Patient does not require insulin.   Medications include: metformin 1000 mg bid, glyburide 5 mg/d Diet is improving.  Exercise: walking  Hypertension Patient presents for hypertension follow up. He does monitor home blood pressures. Blood pressures ranging on average from 130's/60's. He is compliant with medications- Norvasc 5 mg/d, chlorthalidone 25 mg/d. Patient has these side effects of medication: none Diet/exercise as above.  No Cp or SOB.   Recently had a close family member pass away. Mood was stable on Zoloft 100 mg/d prior to that. No AE's, compliant. He is having trouble sleeping.   Has been dealing with balanitis.  He was told to take some sort of antifungal drop.  Still having burning and irritation.  Past Medical History:  Diagnosis Date   ADD (attention deficit disorder)    Asthma    Bipolar 1 disorder (HCC)    Diabetes mellitus type 2 in obese (HCC) 08/02/2016   Hypertension    Obesity      Related testing: Retinal exam: Done Pneumovax: done  Objective:  No conversational dyspnea Age appropriate judgment and insight Nml affect and mood  Assessment:   Diabetes mellitus type 2 in obese (HCC) - Plan: metFORMIN (GLUCOPHAGE) 500 MG tablet, Lipid panel, Hemoglobin A1c, Comprehensive metabolic panel  Essential  hypertension - Plan: amLODipine (NORVASC) 5 MG tablet  GAD (generalized anxiety disorder) - Plan: sertraline (ZOLOFT) 100 MG tablet  Depression, major, single episode, in partial remission (HCC) - Plan: sertraline (ZOLOFT) 100 MG tablet  Balanitis - Plan: clotrimazole-betamethasone (LOTRISONE) cream   Plan:   Chronic, hopefully stable.  Continue metformin 1000 mg twice daily, glyburide 5 mg daily.  Counseled on diet and exercise. Chronic, seems like he is now stable.  Continue chlorthalidone 25 mg daily, amlodipine 5 mg daily.  He had angioedema with ACE inhibitors so we are avoiding that class and ARB's. Continue Zoloft 100 mg daily.  Add doxepin 6 mg nightly as needed. As above. 1 to 2 weeks of clotrimazole cream.  Could consider 1% hydrocortisone cream for symptom management as well. Flu shot scheduled in 3 days.  Will see if we can get the above blood work at that time as well. F/u in 3-6 mo pending above. The patient voiced understanding and agreement to the plan.  Jilda Roche [place], DO 03/02/22 10:31 AM

## 2022-03-03 ENCOUNTER — Encounter: Payer: Self-pay | Admitting: Family Medicine

## 2022-03-04 ENCOUNTER — Other Ambulatory Visit: Payer: Self-pay | Admitting: Family Medicine

## 2022-03-04 MED ORDER — DOXEPIN HCL 10 MG PO CAPS: 10.0000 mg | ORAL_CAPSULE | Freq: Every evening | ORAL | 1 refills | Status: DC | PRN

## 2022-03-05 ENCOUNTER — Ambulatory Visit: Payer: BC Managed Care – PPO

## 2022-03-05 ENCOUNTER — Ambulatory Visit: Payer: BC Managed Care – PPO | Admitting: Family Medicine

## 2022-03-15 ENCOUNTER — Ambulatory Visit: Payer: BC Managed Care – PPO | Admitting: Family Medicine

## 2022-03-24 ENCOUNTER — Other Ambulatory Visit: Payer: Self-pay | Admitting: Family Medicine

## 2022-03-25 ENCOUNTER — Encounter: Payer: Self-pay | Admitting: Family Medicine

## 2022-03-25 MED ORDER — ALBUTEROL SULFATE HFA 108 (90 BASE) MCG/ACT IN AERS: 1.0000 | INHALATION_SPRAY | Freq: Four times a day (QID) | RESPIRATORY_TRACT | 5 refills | Status: DC | PRN

## 2022-04-06 ENCOUNTER — Telehealth: Payer: BC Managed Care – PPO | Admitting: Physician Assistant

## 2022-04-06 DIAGNOSIS — M5441 Lumbago with sciatica, right side: Secondary | ICD-10-CM | POA: Diagnosis not present

## 2022-04-06 DIAGNOSIS — M5442 Lumbago with sciatica, left side: Secondary | ICD-10-CM

## 2022-04-06 MED ORDER — CYCLOBENZAPRINE HCL 10 MG PO TABS: 5.0000 mg | ORAL_TABLET | Freq: Three times a day (TID) | ORAL | 0 refills | Status: AC | PRN

## 2022-04-06 MED ORDER — NAPROXEN 500 MG PO TABS: 500.0000 mg | ORAL_TABLET | Freq: Two times a day (BID) | ORAL | 0 refills | Status: DC

## 2022-04-06 NOTE — Progress Notes (Signed)

## 2022-04-12 ENCOUNTER — Telehealth: Payer: BC Managed Care – PPO | Admitting: Physician Assistant

## 2022-04-12 DIAGNOSIS — R300 Dysuria: Secondary | ICD-10-CM

## 2022-04-12 NOTE — Progress Notes (Signed)
E-Visit for Urinary Problems  Based on what you shared with me, I feel your condition warrants further evaluation and I recommend that you be seen for a face to face office visit.  Male bladder infections are not very common.  We worry about prostate or kidney conditions.  The standard of care is to examine the abdomen and kidneys, and to do a urine and blood test to make sure that something more serious is not going on.  We recommend that you see a provider today.  If your doctor's office is closed [place] has the following Urgent Cares:    NOTE: You will not be charged for this e-visit.  If you are having a true medical emergency please call 911.       For an urgent face to face visit, [place] has six urgent care centers for your convenience:     [place] [facility] at [place] Get Driving Directions [phone] [address] [place], [place] [address]    [facility] ([place]) Get Driving Directions [phone] [address] [place], [place] [address]  [place] [facility] ([place]) Get Driving Directions [phone] [address] [place],  [place]  [address]  [place] [facility] at [facility] Get Driving Directions [phone] [address] [place], [place] [address]   [place] [facility] at [facility] [place] Get Driving Directions  [phone] [address] [place], [place] [address]   [place] [facility] at [place] Get Driving Directions [phone] [address] ,  [address]  Your MyChart E-visit questionnaire answers were reviewed by a board certified advanced clinical practitioner to complete your personal care plan based on your specific symptoms.  Thank you for using e-Visits.     I have spent 5 minutes in review of e-visit questionnaire, review and updating patient chart, medical  decision making and response to patient.   Kamilah Correia M Amarise Lillo, PA-C  

## 2022-04-26 ENCOUNTER — Encounter: Payer: Self-pay | Admitting: Family Medicine

## 2022-05-03 ENCOUNTER — Ambulatory Visit: Payer: BC Managed Care – PPO | Admitting: Family Medicine

## 2022-05-07 ENCOUNTER — Ambulatory Visit (INDEPENDENT_AMBULATORY_CARE_PROVIDER_SITE_OTHER): Payer: BC Managed Care – PPO | Admitting: Family Medicine

## 2022-05-07 ENCOUNTER — Encounter: Payer: Self-pay | Admitting: Family Medicine

## 2022-05-07 VITALS — BP 142/102 | HR 104 | Temp 98.7°F | Ht 72.0 in | Wt 371.0 lb

## 2022-05-07 DIAGNOSIS — I10 Essential (primary) hypertension: Secondary | ICD-10-CM

## 2022-05-07 DIAGNOSIS — N481 Balanitis: Secondary | ICD-10-CM | POA: Diagnosis not present

## 2022-05-07 MED ORDER — AMLODIPINE BESYLATE 10 MG PO TABS: 10.0000 mg | ORAL_TABLET | Freq: Every day | ORAL | 3 refills | Status: DC

## 2022-05-07 MED ORDER — CLOTRIMAZOLE-BETAMETHASONE 1-0.05 % EX CREA: 1.0000 | TOPICAL_CREAM | Freq: Every day | CUTANEOUS | 0 refills | Status: DC

## 2022-05-07 NOTE — Patient Instructions (Signed)
If you do not hear anything about your referral in the next 1-2 weeks, call our office and ask for an update.  Check your blood pressures 2-3 times per week, alternating the time of day you check it. If it is high, considering waiting 1-2 minutes and rechecking. If it gets higher, your anxiety is likely creeping up and we should avoid rechecking.   Keep the diet clean and stay active.  Let us know if you need anything.

## 2022-05-07 NOTE — Progress Notes (Signed)
Chief Complaint  Patient presents with   Follow-up    Yeast infection    Luis Boyer is a 32 y.o. male here for a skin complaint.  Duration: a few days Location: penis Pruritic? Yes Painful? Yes Drainage? Not actively New soaps/lotions/topicals/detergents? No Sick contacts? No Other associated symptoms: some curdy dc Therapies tried thus far: Lotrisone for a few days, has helped Reports good hygiene. This happens routinely at this point.   Hypertension Patient presents for hypertension follow up. He does monitor home blood pressures. Blood pressures ranging on average from 130's/60's. He is compliant with medications- Norvasc 5 mg/d, chlorthalidone 25 mg/d. Patient has these side effects of medication: none He is rarely adhering to a healthy diet overall. Exercise: walking No CP or SOB.  Past Medical History:  Diagnosis Date   ADD (attention deficit disorder)    Asthma    Bipolar 1 disorder ([place])    Diabetes mellitus type 2 in obese ([place]) 08/02/2016   Hypertension    Obesity     BP (!) 142/102 (BP Location: Left Arm, Cuff Size: Large)   Pulse (!) 104   Temp 98.7 F (37.1 C) (Oral)   Ht 6' (1.829 m)   Wt (!) 371 lb (168.3 kg)   SpO2 97%   BMI 50.32 kg/m  Gen: awake, alert, appearing stated age Lungs: No accessory muscle use Skin: mild erythematous patches on glans with curdy dc. Psych: Age appropriate judgment and insight  Essential hypertension - Plan: amLODipine (NORVASC) 10 MG tablet  Balanitis - Plan: clotrimazole-betamethasone (LOTRISONE) cream, Ambulatory referral to Urology  Chronic, uncontrolled.  Increase amlodipine from 5 mg daily to 10 mg daily.  Continue chlorthalidone 25 mg daily.  Counseled on diet and exercise.  Monitor blood pressure at home.  Follow-up in 1 month. Lotrisone twice daily for the next 10-14 d.  Refer to urology to discuss circumcision as this is a recurrent issue.  Good hygiene encouraged which it sounds like he is doing. The  patient voiced understanding and agreement to the plan.  [place], DO 05/07/22 3:15 PM

## 2022-05-15 ENCOUNTER — Telehealth: Payer: BC Managed Care – PPO | Admitting: Family

## 2022-05-15 DIAGNOSIS — M545 Low back pain, unspecified: Secondary | ICD-10-CM

## 2022-05-15 DIAGNOSIS — R350 Frequency of micturition: Secondary | ICD-10-CM

## 2022-05-15 NOTE — Progress Notes (Signed)
Because of your back pain and urinary frequency you need to be seen face to face today at the urgent care to rule out a kidney infection.    NOTE: There will be NO CHARGE for this eVisit   If you are having a true medical emergency please call 911.      For an urgent face to face visit, [place] has eight urgent care centers for your convenience:   NEW!! [place] Urgent [place] at [place] Get Driving Directions [id number] [address] [place], [place] Urgent [place] at [place] Get Driving Directions [id number] [place], [place] [address]   [place] [facility] [place] [facility]) Get Driving Directions [id number] [address], [place] [address]  [place] Urgent [place] ([place]) Get Driving Directions [id number] [address] [place],  [place]  [address]  [place] [facility]gent [place] [facility] [place] Get Driving Directions  [id number] [phone] [address] [place],  [place] [address]   [place] [facility] at [facility] Get Driving Directions [id number] [place], [place], [place] [address]   [place] [facility] at [facility] Get Driving Directions  [id number] [address] Suite [place], [place] [address]   [place] [facility] at [place] Get Driving Directions [id number] [address] [place], [place] [address]  Your MyChart E-visit questionnaire answers were reviewed by a board certified advanced clinical practitioner to complete your personal care plan based on your specific symptoms.  Thank you for using e-Visits.

## 2022-05-23 ENCOUNTER — Telehealth: Payer: BC Managed Care – PPO | Admitting: Physician Assistant

## 2022-05-23 DIAGNOSIS — R0602 Shortness of breath: Secondary | ICD-10-CM

## 2022-05-23 DIAGNOSIS — R6889 Other general symptoms and signs: Secondary | ICD-10-CM

## 2022-05-23 NOTE — Progress Notes (Signed)
Because you are having shortness of breath in flu-like, I feel your condition warrants further evaluation and I recommend that you be seen in a face to face visit.You should have your lungs listened to and considerations for imaging to rule out pneumonia.   NOTE: There will be NO CHARGE for this eVisit   If you are having a true medical emergency please call 911.      For an urgent face to face visit, [place] has eight urgent care centers for your convenience:   NEW!! [place] Urgent [place] Get Driving Directions [id number] [address] [place], [place] Urgent [place] at [place] Get Driving Directions [id number] [address] [place], [place] [address]   [place] Urgent [place] [facility]) Get Driving Directions [id number] [address] [place], [place] [address]  [place] [facility]gent [place] ([place]) Get Driving Directions [id number] [address] [place],  [place]  [address]  [place] [facility] [place] [facility] at [facility] Get Driving Directions  [id number] [phone] [address] [place],  [place] [address]   [place] [facility] at [facility] Get Driving Directions [id number] [address] [place], [place], [place] [address]   [place] [facility] at [facility] [place] Get Driving Directions  [id number] [address] [place], [place] [address]   [place] [facility] at  Get Driving Directions [id number] [address], [place], [place] [address]  Your MyChart E-visit questionnaire answers were reviewed by a board certified advanced clinical practitioner to complete your personal care plan based on your specific symptoms.  Thank you for using e-Visits.    I have spent 5 minutes in review of e-visit questionnaire, review and updating patient chart,  medical decision making and response to patient.   Mar Daring, PA-C

## 2022-06-11 ENCOUNTER — Ambulatory Visit: Payer: BC Managed Care – PPO | Admitting: Family Medicine

## 2022-06-24 ENCOUNTER — Other Ambulatory Visit: Payer: Self-pay | Admitting: Family Medicine

## 2022-06-24 ENCOUNTER — Encounter: Payer: Self-pay | Admitting: Family Medicine

## 2022-06-24 DIAGNOSIS — N481 Balanitis: Secondary | ICD-10-CM

## 2022-07-07 ENCOUNTER — Other Ambulatory Visit: Payer: Self-pay | Admitting: Family Medicine

## 2022-07-07 DIAGNOSIS — E1169 Type 2 diabetes mellitus with other specified complication: Secondary | ICD-10-CM

## 2022-07-26 ENCOUNTER — Other Ambulatory Visit: Payer: Self-pay | Admitting: Family Medicine

## 2022-07-26 DIAGNOSIS — N481 Balanitis: Secondary | ICD-10-CM

## 2022-08-24 ENCOUNTER — Telehealth: Payer: BC Managed Care – PPO | Admitting: Nurse Practitioner

## 2022-08-24 DIAGNOSIS — J0140 Acute pansinusitis, unspecified: Secondary | ICD-10-CM

## 2022-08-25 MED ORDER — FLUTICASONE PROPIONATE 50 MCG/ACT NA SUSP
2.0000 | Freq: Every day | NASAL | 6 refills | Status: DC
Start: 2022-08-25 — End: 2023-05-16

## 2022-08-25 MED ORDER — AMOXICILLIN-POT CLAVULANATE 875-125 MG PO TABS
1.0000 | ORAL_TABLET | Freq: Two times a day (BID) | ORAL | 0 refills | Status: AC
Start: 2022-08-25 — End: 2022-09-01

## 2022-08-25 NOTE — Progress Notes (Signed)
E-Visit for Sinus Problems  We are sorry that you are not feeling well.  Here is how we plan to help!  Based on what you have shared with me it looks like you have sinusitis.  Sinusitis is inflammation and infection in the sinus cavities of the head.  Based on your presentation I believe you most likely have Acute Bacterial Sinusitis.  This is an infection caused by bacteria and is treated with antibiotics. I have prescribed Augmentin 875mg /125mg  one tablet twice daily with food, for 7 days. We will also send in a prescription nasal spray for added relief.  Meds ordered this encounter  Medications   amoxicillin-clavulanate (AUGMENTIN) 875-125 MG tablet    Sig: Take 1 tablet by mouth 2 (two) times daily for 7 days.    Dispense:  14 tablet    Refill:  0   fluticasone (FLONASE) 50 MCG/ACT nasal spray    Sig: Place 2 sprays into both nostrils daily.    Dispense:  16 g    Refill:  6    You may use an oral decongestant such as Mucinex D or if you have glaucoma or high blood pressure use plain Mucinex. Saline nasal spray help and can safely be used as often as needed for congestion.  If you develop worsening sinus pain, fever or notice severe headache and vision changes, or if symptoms are not better after completion of antibiotic, please schedule an appointment with a health care provider.    Sinus infections are not as easily transmitted as other respiratory infection, however we still recommend that you avoid close contact with loved ones, especially the very young and elderly.  Remember to wash your hands thoroughly throughout the day as this is the number one way to prevent the spread of infection!  Home Care: Only take medications as instructed by your medical team. Complete the entire course of an antibiotic. Do not take these medications with alcohol. A steam or ultrasonic humidifier can help congestion.  You can place a towel over your head and breathe in the steam from hot water coming  from a faucet. Avoid close contacts especially the very young and the elderly. Cover your mouth when you cough or sneeze. Always remember to wash your hands.  Get Help Right Away If: You develop worsening fever or sinus pain. You develop a severe head ache or visual changes. Your symptoms persist after you have completed your treatment plan.  Make sure you Understand these instructions. Will watch your condition. Will get help right away if you are not doing well or get worse.  Thank you for choosing an e-visit.  Your e-visit answers were reviewed by a board certified advanced clinical practitioner to complete your personal care plan. Depending upon the condition, your plan could have included both over the counter or prescription medications.  Please review your pharmacy choice. Make sure the pharmacy is open so you can pick up prescription now. If there is a problem, you may contact your provider through [employer] and have the prescription routed to another pharmacy.  Your safety is important to [place]. If you have drug allergies check your prescription carefully.   For the next 24 hours you can use MyChart to ask questions about today's visit, request a non-urgent call back, or ask for a work or school excuse. You will get an email in the next two days asking about your experience. I hope that your e-visit has been valuable and will speed your recovery.  I spent approximately 5 minutes reviewing the patient's history, current symptoms and coordinating their care today.

## 2022-09-14 ENCOUNTER — Other Ambulatory Visit: Payer: Self-pay | Admitting: Family Medicine

## 2022-09-14 DIAGNOSIS — E669 Obesity, unspecified: Secondary | ICD-10-CM

## 2022-10-18 ENCOUNTER — Ambulatory Visit: Payer: BC Managed Care – PPO | Admitting: Family Medicine

## 2022-10-21 ENCOUNTER — Encounter (INDEPENDENT_AMBULATORY_CARE_PROVIDER_SITE_OTHER): Payer: Self-pay

## 2022-11-06 ENCOUNTER — Other Ambulatory Visit: Payer: Self-pay | Admitting: Family Medicine

## 2022-11-06 DIAGNOSIS — N481 Balanitis: Secondary | ICD-10-CM

## 2022-11-09 ENCOUNTER — Ambulatory Visit: Payer: BC Managed Care – PPO | Admitting: Family Medicine

## 2022-11-29 ENCOUNTER — Other Ambulatory Visit: Payer: Self-pay | Admitting: Family Medicine

## 2022-11-29 DIAGNOSIS — E1169 Type 2 diabetes mellitus with other specified complication: Secondary | ICD-10-CM

## 2022-12-06 ENCOUNTER — Ambulatory Visit: Payer: BC Managed Care – PPO | Admitting: Family Medicine

## 2022-12-10 ENCOUNTER — Ambulatory Visit: Payer: BC Managed Care – PPO | Admitting: Family Medicine

## 2023-01-09 ENCOUNTER — Encounter: Payer: Self-pay | Admitting: Family Medicine

## 2023-01-10 ENCOUNTER — Other Ambulatory Visit: Payer: Self-pay | Admitting: Family Medicine

## 2023-01-10 DIAGNOSIS — F411 Generalized anxiety disorder: Secondary | ICD-10-CM

## 2023-01-10 DIAGNOSIS — F324 Major depressive disorder, single episode, in partial remission: Secondary | ICD-10-CM

## 2023-01-11 NOTE — Telephone Encounter (Signed)
Pt refused office visit.

## 2023-01-11 NOTE — Telephone Encounter (Signed)
Called pt to get him scheduled for appt today and he refused office visit. Pt stated he has visit on 01/26/2023. He stated  bump is going down now, just wanted to see if he get medication. Pt advised he will need to seen.

## 2023-01-14 ENCOUNTER — Ambulatory Visit: Payer: BC Managed Care – PPO | Admitting: Family Medicine

## 2023-01-19 ENCOUNTER — Other Ambulatory Visit: Payer: Self-pay | Admitting: Family Medicine

## 2023-01-19 DIAGNOSIS — E1169 Type 2 diabetes mellitus with other specified complication: Secondary | ICD-10-CM

## 2023-01-26 ENCOUNTER — Ambulatory Visit: Payer: BC Managed Care – PPO | Admitting: Family Medicine

## 2023-02-02 ENCOUNTER — Ambulatory Visit: Payer: BC Managed Care – PPO | Admitting: Family Medicine

## 2023-02-11 ENCOUNTER — Ambulatory Visit: Payer: BC Managed Care – PPO | Admitting: Family Medicine

## 2023-02-18 ENCOUNTER — Ambulatory Visit: Payer: BC Managed Care – PPO | Admitting: Family Medicine

## 2023-03-18 ENCOUNTER — Ambulatory Visit: Payer: BC Managed Care – PPO | Admitting: Family Medicine

## 2023-03-21 ENCOUNTER — Ambulatory Visit: Payer: BC Managed Care – PPO | Admitting: Family Medicine

## 2023-03-22 ENCOUNTER — Ambulatory Visit: Payer: BC Managed Care – PPO | Admitting: Family Medicine

## 2023-04-04 ENCOUNTER — Ambulatory Visit: Payer: BC Managed Care – PPO | Admitting: Family Medicine

## 2023-04-09 ENCOUNTER — Other Ambulatory Visit: Payer: Self-pay | Admitting: Family Medicine

## 2023-04-09 DIAGNOSIS — F411 Generalized anxiety disorder: Secondary | ICD-10-CM

## 2023-04-09 DIAGNOSIS — E1169 Type 2 diabetes mellitus with other specified complication: Secondary | ICD-10-CM

## 2023-04-09 DIAGNOSIS — F324 Major depressive disorder, single episode, in partial remission: Secondary | ICD-10-CM

## 2023-04-20 ENCOUNTER — Ambulatory Visit: Payer: BC Managed Care – PPO | Admitting: Family Medicine

## 2023-04-25 ENCOUNTER — Ambulatory Visit: Payer: BC Managed Care – PPO | Admitting: Family Medicine

## 2023-04-28 ENCOUNTER — Other Ambulatory Visit: Payer: Self-pay | Admitting: Family Medicine

## 2023-05-09 ENCOUNTER — Ambulatory Visit: Payer: BC Managed Care – PPO | Admitting: Family Medicine

## 2023-05-12 ENCOUNTER — Other Ambulatory Visit: Payer: Self-pay | Admitting: Family Medicine

## 2023-05-12 DIAGNOSIS — N481 Balanitis: Secondary | ICD-10-CM

## 2023-05-16 ENCOUNTER — Encounter: Payer: Self-pay | Admitting: Family Medicine

## 2023-05-16 ENCOUNTER — Ambulatory Visit (INDEPENDENT_AMBULATORY_CARE_PROVIDER_SITE_OTHER): Payer: BC Managed Care – PPO | Admitting: Family Medicine

## 2023-05-16 ENCOUNTER — Ambulatory Visit: Payer: BC Managed Care – PPO | Admitting: Family Medicine

## 2023-05-16 ENCOUNTER — Telehealth (HOSPITAL_BASED_OUTPATIENT_CLINIC_OR_DEPARTMENT_OTHER): Payer: Self-pay

## 2023-05-16 VITALS — BP 150/98 | HR 96 | Resp 20 | Ht 72.0 in | Wt 358.2 lb

## 2023-05-16 DIAGNOSIS — Z23 Encounter for immunization: Secondary | ICD-10-CM

## 2023-05-16 DIAGNOSIS — I10 Essential (primary) hypertension: Secondary | ICD-10-CM | POA: Diagnosis not present

## 2023-05-16 DIAGNOSIS — E010 Iodine-deficiency related diffuse (endemic) goiter: Secondary | ICD-10-CM | POA: Diagnosis not present

## 2023-05-16 DIAGNOSIS — E1165 Type 2 diabetes mellitus with hyperglycemia: Secondary | ICD-10-CM | POA: Diagnosis not present

## 2023-05-16 DIAGNOSIS — F324 Major depressive disorder, single episode, in partial remission: Secondary | ICD-10-CM | POA: Diagnosis not present

## 2023-05-16 DIAGNOSIS — Z7984 Long term (current) use of oral hypoglycemic drugs: Secondary | ICD-10-CM

## 2023-05-16 MED ORDER — CHLORTHALIDONE 25 MG PO TABS: 25.0000 mg | ORAL_TABLET | Freq: Every day | ORAL | 5 refills | Status: AC

## 2023-05-16 MED ORDER — AMLODIPINE BESYLATE 10 MG PO TABS: 10.0000 mg | ORAL_TABLET | Freq: Every day | ORAL | 3 refills | Status: AC

## 2023-05-16 MED ORDER — NORTRIPTYLINE HCL 10 MG PO CAPS: 10.0000 mg | ORAL_CAPSULE | Freq: Every day | ORAL | 0 refills | Status: AC

## 2023-05-16 NOTE — Progress Notes (Signed)
 Subjective:   Chief Complaint  Patient presents with   Medical Management of Chronic Issues    Patient presents today for follow-up appointment    Luis Boyer is a 33 y.o. male here for follow-up of diabetes.   [place]'s self monitored glucose range is mid 100's.  Patient denies hypoglycemic reactions. He checks his glucose levels intermittently. Patient does not require insulin.   Medications include: metformin 1000 mg bid, glyburide 5 mg/d Diet is improving.  Exercise: wt training, cardio  Hypertension Patient presents for hypertension follow up. He does monitor home blood pressures. Blood pressures ranging on average from 130's/80's. He is compliant with medication Norvasc 10 mg/d, but he unfortunately ran out recently. Patient has these side effects of medication: none Diet/exercise as above. No CP or SOB.   Depression Patient has been struggling with his depression lately.  He is compliant with Zoloft 100 mg daily.  No homicidal or suicidal ideation.  He is not following with a therapist.  He is wanting something to help with both his depression and mood.  Past Medical History:  Diagnosis Date   ADD (attention deficit disorder)    Asthma    Bipolar 1 disorder (HCC)    Diabetes mellitus type 2 in obese 08/02/2016   Hypertension    Obesity      Related testing: Retinal exam: Done Pneumovax: done  Objective:  BP (!) 150/98   Pulse 96   Resp 20   Ht 6' (1.829 m)   Wt (!) 358 lb 3.2 oz (162.5 kg)   SpO2 95%   BMI 48.58 kg/m  General:  Well developed, well nourished, in no apparent distress Neck: Large central mass over the thyroid region.  No TTP, fluctuance, erythema. Skin:  Warm, no pallor or diaphoresis Head:  Normocephalic, atraumatic Eyes:  Pupils equal and round, sclera anicteric without injection  Lungs:  CTAB, no access msc use Cardio:  RRR, no bruits, no LE edema Musculoskeletal:  Symmetrical muscle groups noted without atrophy or deformity Neuro:   Sensation intact to pinprick on feet Psych: Age appropriate judgment and insight  Assessment:   Type 2 diabetes mellitus with hyperglycemia, unspecified whether long term insulin use (HCC) - Plan: Hemoglobin A1c, Microalbumin / creatinine urine ratio, Comp Met (CMET), CBC with Differential/Platelet  Essential hypertension - Plan: amLODipine (NORVASC) 10 MG tablet, chlorthalidone (HYGROTON) 25 MG tablet  Depression, major, single episode, in partial remission (HCC) - Plan: nortriptyline (PAMELOR) 10 MG capsule  Thyromegaly - Plan: US THYROID, TSH, T4, free  Need for pneumococcal 20-valent conjugate vaccination - Plan: Pneumococcal conjugate vaccine 20-valent (Prevnar 20)   Plan:   Chronic, hopefully controlled.  Continue metformin 1000 mg twice daily, glyburide 5 mg daily.  Counseled on diet and exercise. Chronic, not controlled.  Continue amlodipine 10 mg daily, restart chlorthalidone 25 mg daily.  Monitor blood pressure at home.  Follow-up in 1 month.  Labs in 1 week. Chronic, not controlled.  Continue Zoloft 100 mg daily, add Pamelor 10 mg nightly as needed.  Counseling information provided.  Follow-up in 1 month. Thyromegaly on today's exam.  Check ultrasound.  No obstructive symptoms.  Will check labs as well. Update pneumococcal vaccination as well. The patient voiced understanding and agreement to the plan.  Jilda Roche [place], DO 05/16/23 1:44 PM

## 2023-05-16 NOTE — Patient Instructions (Signed)
Give us 2-3 business days to get the results of your labs back.  ° °Keep the diet clean and stay active. ° °Please consider counseling. Contact [phone] to schedule an appointment or inquire about cost/insurance coverage. ° °[facility] located at [address], [place], [place].  Phone number = [phone].  Dr. Onoriode Edeh - Adult Psychiatry.  °  °[facility] located at [address], [place], [place]. Phone number = [phone]. °  °[facility] located at [address], [place], [place].  Phone number = [phone]. °  °[facility] located at [address], [place], [place].  Phone number = [phone]. ° °Let us know if you need anything. °

## 2023-05-23 ENCOUNTER — Other Ambulatory Visit

## 2023-06-04 ENCOUNTER — Ambulatory Visit (HOSPITAL_BASED_OUTPATIENT_CLINIC_OR_DEPARTMENT_OTHER)
Admission: RE | Admit: 2023-06-04 | Discharge: 2023-06-04 | Disposition: A | Discharge status: Home / Self Care | Source: Ambulatory Visit | Attending: Family Medicine | Admitting: Family Medicine

## 2023-06-04 DIAGNOSIS — E010 Iodine-deficiency related diffuse (endemic) goiter: Secondary | ICD-10-CM | POA: Insufficient documentation

## 2023-06-04 DIAGNOSIS — R221 Localized swelling, mass and lump, neck: Secondary | ICD-10-CM | POA: Diagnosis not present

## 2023-06-13 ENCOUNTER — Encounter: Payer: Self-pay | Admitting: Family Medicine

## 2023-06-13 ENCOUNTER — Other Ambulatory Visit: Payer: Self-pay | Admitting: Family Medicine

## 2023-06-13 DIAGNOSIS — E1169 Type 2 diabetes mellitus with other specified complication: Secondary | ICD-10-CM

## 2023-06-17 ENCOUNTER — Ambulatory Visit: Admitting: Family Medicine

## 2023-06-21 ENCOUNTER — Other Ambulatory Visit: Payer: Self-pay | Admitting: Family Medicine

## 2023-06-21 DIAGNOSIS — F324 Major depressive disorder, single episode, in partial remission: Secondary | ICD-10-CM

## 2023-06-21 DIAGNOSIS — E049 Nontoxic goiter, unspecified: Secondary | ICD-10-CM

## 2023-06-21 DIAGNOSIS — F411 Generalized anxiety disorder: Secondary | ICD-10-CM

## 2023-06-21 MED ORDER — SERTRALINE HCL 100 MG PO TABS: 100.0000 mg | ORAL_TABLET | Freq: Every day | ORAL | 0 refills | Status: DC

## 2023-06-28 ENCOUNTER — Other Ambulatory Visit: Payer: Self-pay | Admitting: Family Medicine

## 2023-06-28 ENCOUNTER — Encounter: Payer: Self-pay | Admitting: Family Medicine

## 2023-06-28 DIAGNOSIS — N481 Balanitis: Secondary | ICD-10-CM

## 2023-06-29 ENCOUNTER — Telehealth: Payer: Self-pay

## 2023-06-29 MED ORDER — CLOTRIMAZOLE-BETAMETHASONE 1-0.05 % EX CREA: TOPICAL_CREAM | Freq: Two times a day (BID) | CUTANEOUS | 0 refills | Status: AC

## 2023-06-29 NOTE — Telephone Encounter (Signed)
 Message sent to pt  and called.

## 2023-07-08 ENCOUNTER — Ambulatory Visit: Admitting: Family Medicine

## 2023-08-05 ENCOUNTER — Ambulatory Visit: Admitting: Family Medicine

## 2023-08-05 ENCOUNTER — Telehealth: Admitting: Physician Assistant

## 2023-08-05 DIAGNOSIS — J069 Acute upper respiratory infection, unspecified: Secondary | ICD-10-CM | POA: Diagnosis not present

## 2023-08-05 MED ORDER — FLUTICASONE PROPIONATE 50 MCG/ACT NA SUSP: 2.0000 | Freq: Every day | NASAL | 0 refills | Status: AC

## 2023-08-05 MED ORDER — BENZONATATE 100 MG PO CAPS: 100.0000 mg | ORAL_CAPSULE | Freq: Three times a day (TID) | ORAL | 0 refills | Status: DC | PRN

## 2023-08-05 NOTE — Progress Notes (Signed)

## 2023-08-22 ENCOUNTER — Ambulatory Visit: Admitting: Family Medicine

## 2023-09-05 ENCOUNTER — Ambulatory Visit (INDEPENDENT_AMBULATORY_CARE_PROVIDER_SITE_OTHER): Admitting: Otolaryngology

## 2023-09-05 ENCOUNTER — Encounter (INDEPENDENT_AMBULATORY_CARE_PROVIDER_SITE_OTHER): Payer: Self-pay | Admitting: Otolaryngology

## 2023-09-05 VITALS — BP 136/91 | HR 86 | Ht 72.0 in | Wt 352.0 lb

## 2023-09-05 DIAGNOSIS — R1314 Dysphagia, pharyngoesophageal phase: Secondary | ICD-10-CM

## 2023-09-05 DIAGNOSIS — E049 Nontoxic goiter, unspecified: Secondary | ICD-10-CM | POA: Diagnosis not present

## 2023-09-05 NOTE — Patient Instructions (Signed)
 I have ordered an imaging study for you to complete prior to your next visit. Please call [facility] Scheduling at [phone] to schedule your imaging if you have not received a call within 24 hours. If you are unable to complete your imaging study prior to your next scheduled visit please call our office to let us know.

## 2023-09-05 NOTE — Progress Notes (Signed)
 Dear Dr. Frann, Here is my assessment for our mutual patient, Luis Boyer. Thank you for allowing me the opportunity to care for your patient. Please do not hesitate to contact me should you have any other questions. Sincerely, Dr. Eldora Blanch  Otolaryngology Clinic Note Referring provider: Dr. Frann HPI:  Luis Boyer is a 33 y.o. male kindly referred by Dr. Frann for evaluation of goiter and dysphagia  Initial visit (08/2023): First noted: incidentally by PCP in 2025, ordered US  and TFTs  Enlarging: he feels like it is getting bigger Compressive symptoms: does report that he is having a harder time swallowing solids (has to chew it significantly, then can swallow). Breathing without issue, denies hoarseness. No pain. No ocular symptoms Hypo or hyperthyroid symptoms: denies Tobacco: no  Accompanied by wife.  Prior evaluation has included: labs, US   History of radiation to H&N: no Family history of thyroid  cancer: grandmother (maternal side)  ENT Surgery: denies Personal or FHx of bleeding dz or anesthesia difficulty: no  GLP-1: no AP/AC: no  PMHx: DM, HTN, MDD  Independent Review of Additional Tests or Records:  Dr. Frann (05/16/2023): noted thyromegaly, some dysphagia; Dx: US , Rx: ref to ENT CMP 09/09/2021: BUN/Cr wnl; TSH 09/09/2021 - 0.06; FT4: 0.86 Thyroid  US  independently interpreted 06/04/2023: enlarged, heterogenous (essentially replaced by nodules?); some calcification left inferior gland.   PMH/Meds/All/SocHx/FamHx/ROS:   Past Medical History:  Diagnosis Date   ADD (attention deficit disorder)    Asthma    Bipolar 1 disorder (HCC)    Diabetes mellitus type 2 in obese 08/02/2016   Hypertension    Obesity      Past Surgical History:  Procedure Laterality Date   NO PAST SURGERIES      Family History  Problem Relation Age of Onset   Cervical cancer Mother    Diabetes Father    Stroke Father    Hypertension Father    Hyperlipidemia Father     Hyperlipidemia Brother    Hypertension Brother    Breast cancer Paternal Aunt    Breast cancer Paternal Grandmother    Colon cancer Neg Hx      Social Connections: Unknown (05/16/2023)   Social Connection and Isolation Panel    Frequency of Communication with Friends and Family: Never    Frequency of Social Gatherings with Friends and Family: Never    Attends Religious Services: More than 4 times per year    Active Member of [employer] or Organizations: Patient declined    Attends Engineer, structural: Not on file    Marital Status: Married      Current Outpatient Medications:    albuterol  (VENTOLIN  HFA) 108 (90 Base) MCG/ACT inhaler, Inhale 1-2 puffs into the lungs every 6 (six) hours as needed for wheezing or shortness of breath., Disp: 18 g, Rfl: 1   amLODipine  (NORVASC ) 10 MG tablet, Take 1 tablet (10 mg total) by mouth daily., Disp: 90 tablet, Rfl: 3   benzonatate  (TESSALON ) 100 MG capsule, Take 1-2 capsules (100-200 mg total) by mouth 3 (three) times daily as needed., Disp: 30 capsule, Rfl: 0   chlorthalidone  (HYGROTON ) 25 MG tablet, Take 1 tablet (25 mg total) by mouth daily., Disp: 30 tablet, Rfl: 5   clotrimazole -betamethasone  (LOTRISONE ) cream, Apply topically 2 (two) times daily. APPLY TOPICALLY TO PENIS TWICE DAILY FOR 10-14 DAYS, Disp: 30 g, Rfl: 0   cyclobenzaprine  (FLEXERIL ) 10 MG tablet, Take 0.5-1 tablets (5-10 mg total) by mouth 3 (three) times daily as needed., Disp: 30 tablet,  Rfl: 0   EQ ALLERGY RELIEF 10 MG tablet, Take 1 tablet by mouth once daily, Disp: 30 tablet, Rfl: 0   fluticasone  (FLONASE ) 50 MCG/ACT nasal spray, Place 2 sprays into both nostrils daily., Disp: 16 g, Rfl: 0   glyBURIDE  (DIABETA ) 5 MG tablet, Take 1 tablet by mouth once daily with breakfast, Disp: 30 tablet, Rfl: 0   metFORMIN  (GLUCOPHAGE ) 500 MG tablet, TAKE 2 TABLETS BY MOUTH TWICE DAILY WITH MEALS, Disp: 120 tablet, Rfl: 5   Multiple Vitamin (MULTIVITAMIN WITH MINERALS) TABS tablet, Take  1 tablet by mouth daily., Disp: , Rfl:    nortriptyline  (PAMELOR ) 10 MG capsule, Take 1 capsule (10 mg total) by mouth at bedtime., Disp: 30 capsule, Rfl: 0   sertraline  (ZOLOFT ) 100 MG tablet, Take 1 tablet (100 mg total) by mouth daily., Disp: 90 tablet, Rfl: 0   Physical Exam:   BP (!) 136/91 (BP Location: Left Arm, Patient Position: Sitting, Cuff Size: Large)   Pulse 86   Ht 6' (1.829 m)   Wt (!) 352 lb (159.7 kg)   SpO2 95%   BMI 47.74 kg/m   Salient findings:  CN II-XII intact Bilateral EAC clear and TM intact with well pneumatized middle ear spaces Anterior rhinoscopy: Septum intact, dev right;; bilateral inferior turbinates without significant hypertrophy No lesions of oral cavity/oropharynx; dentition fair No obviously palpable neck masses except for large thyroid  goiter, left more nodular than right, appears all suprasternal No respiratory distress or stridor; easily lays flat; voice quality class 2.5 (some baseline gravely quality to voice); TFL was indicated to better evaluate the proximal airway, given the patient's history and exam findings, and is detailed below.  Seprately Identifiable Procedures:  Prior to initiating any procedures, risks/benefits/alternatives were explained to the patient and verbal consent obtained. Procedure Note Pre-procedure diagnosis:  Dysphagia, large thyroid  mass Post-procedure diagnosis: Same Procedure: Transnasal Fiberoptic Laryngoscopy, CPT 31575 - Mod 25 Indication: see above Complications: None apparent EBL: 0 mL  The procedure was undertaken to further evaluate the patient's complaint above, with mirror exam inadequate for appropriate examination due to gag reflex and poor patient tolerance  Procedure:  Patient was identified as correct patient. Verbal consent was obtained. The nose was sprayed with oxymetazoline and 4% lidocaine . The The flexible laryngoscope was passed through the nose to view the nasal cavity, pharynx (oropharynx,  hypopharynx) and larynx.  The larynx was examined at rest and during multiple phonatory tasks. Documentation was obtained and reviewed with patient. The scope was removed. The patient tolerated the procedure well.  Findings: The nasal cavity and nasopharynx did not reveal any masses or lesions, mucosa appeared to be without obvious lesions. The tongue base, pharyngeal walls, piriform sinuses, vallecula, epiglottis and postcricoid region are normal in appearance with mild post-cricoid edema and redundant mucosa. The visualized portion of the subglottis and proximal trachea is widely patent. The vocal folds are mobile bilaterally. There are no lesions on the free edge of the vocal folds nor elsewhere in the larynx worrisome for malignancy.      Electronically signed by: Eldora KATHEE Blanch, MD 09/05/2023 11:25 AM   Impression & Plans:  Luis Boyer is a 33 y.o. male with: 1. Thyroid  goiter   2. Pharyngoesophageal dysphagia    Noted large thyroid  goiter (seems to essentially be completely replaced by nodules on US ?) with compressive symptoms - dysphagia, pressure sensation. Last thyroid  labs were almost two years ago with TSH low. As such, makes most sense to start with TFTs and  given low TSH will also check for Graves/autoimmune thyroiditis.  We also discussed dysphagia can be due to other causes so will get swallow eval If no stricture, can consider total thyroidectomy v/s lobectomy for compressive symptoms  - f/u 6 weeks  See below regarding exact medications prescribed this encounter including dosages and route: No orders of the defined types were placed in this encounter.     Thank you for allowing me the opportunity to care for your patient. Please do not hesitate to contact me should you have any other questions.  Sincerely, Eldora Blanch, MD Otolaryngologist (ENT), [facility] Phone: [phone] Fax: [phone]  09/05/2023, 11:24 AM   I have personally spent 47  minutes involved in face-to-face and non-face-to-face activities for this patient on the day of the visit.  Professional time spent excludes any procedures performed but includes the following activities, in addition to those noted in the documentation: preparing to see the patient (review of outside documentation and results), performing a medically appropriate examination, counseling, documenting in the electronic health record, independently interpreting results (US ).

## 2023-09-10 LAB — THYROTROPIN RECEPTOR AUTOABS: Thyrotropin Receptor Ab: <1.1 IU/L (ref 0.00–1.75)

## 2023-09-10 LAB — T4, FREE: Free T4: 1.02 ng/dL (ref 0.82–1.77)

## 2023-09-10 LAB — ANTI-TPO AB (RDL): Anti-TPO Ab (RDL): <9 [IU]/mL (ref <9.0)

## 2023-09-10 LAB — TSH: TSH: 0.41 u[IU]/mL — ABNORMAL LOW (ref 0.450–4.500)

## 2023-09-12 ENCOUNTER — Ambulatory Visit: Admitting: Family Medicine

## 2023-09-19 ENCOUNTER — Other Ambulatory Visit: Payer: Self-pay | Admitting: Family Medicine

## 2023-09-19 ENCOUNTER — Ambulatory Visit: Admitting: Family Medicine

## 2023-09-19 DIAGNOSIS — E669 Obesity, unspecified: Secondary | ICD-10-CM

## 2023-09-21 ENCOUNTER — Other Ambulatory Visit (HOSPITAL_COMMUNITY): Payer: Self-pay | Admitting: Otolaryngology

## 2023-09-21 DIAGNOSIS — R1310 Dysphagia, unspecified: Secondary | ICD-10-CM

## 2023-10-03 ENCOUNTER — Ambulatory Visit (HOSPITAL_COMMUNITY)
Admission: RE | Admit: 2023-10-03 | Discharge: 2023-10-03 | Disposition: A | Discharge status: Home / Self Care | Source: Ambulatory Visit | Attending: *Deleted | Admitting: *Deleted

## 2023-10-03 ENCOUNTER — Ambulatory Visit: Admitting: Family Medicine

## 2023-10-03 ENCOUNTER — Ambulatory Visit (HOSPITAL_COMMUNITY)

## 2023-10-03 ENCOUNTER — Ambulatory Visit (HOSPITAL_COMMUNITY): Admission: RE | Admit: 2023-10-03 | Source: Ambulatory Visit

## 2023-10-03 DIAGNOSIS — R1314 Dysphagia, pharyngoesophageal phase: Secondary | ICD-10-CM

## 2023-10-06 ENCOUNTER — Ambulatory Visit: Admitting: Adult Health

## 2023-10-06 DIAGNOSIS — J45909 Unspecified asthma, uncomplicated: Secondary | ICD-10-CM

## 2023-10-06 DIAGNOSIS — R0683 Snoring: Secondary | ICD-10-CM

## 2023-10-17 ENCOUNTER — Ambulatory Visit: Admitting: Family Medicine

## 2023-10-17 ENCOUNTER — Ambulatory Visit (HOSPITAL_COMMUNITY)

## 2023-10-17 ENCOUNTER — Ambulatory Visit (INDEPENDENT_AMBULATORY_CARE_PROVIDER_SITE_OTHER): Admitting: Otolaryngology

## 2023-10-17 ENCOUNTER — Ambulatory Visit (HOSPITAL_COMMUNITY): Admission: RE | Admit: 2023-10-17 | Source: Ambulatory Visit

## 2023-10-19 ENCOUNTER — Other Ambulatory Visit: Payer: Self-pay | Admitting: Family Medicine

## 2023-10-19 DIAGNOSIS — F324 Major depressive disorder, single episode, in partial remission: Secondary | ICD-10-CM

## 2023-10-19 DIAGNOSIS — F411 Generalized anxiety disorder: Secondary | ICD-10-CM

## 2023-10-23 ENCOUNTER — Telehealth: Admitting: Nurse Practitioner

## 2023-10-23 DIAGNOSIS — M545 Low back pain, unspecified: Secondary | ICD-10-CM

## 2023-10-23 MED ORDER — LIDOCAINE 5 % EX PTCH: 1.0000 | MEDICATED_PATCH | CUTANEOUS | 0 refills | Status: AC

## 2023-10-23 NOTE — Progress Notes (Signed)

## 2023-10-23 NOTE — Progress Notes (Signed)
 I have spent 5 minutes in review of e-visit questionnaire, review and updating patient chart, medical decision making and response to patient.   Claiborne Rigg, NP

## 2023-10-24 ENCOUNTER — Ambulatory Visit: Admitting: Family Medicine

## 2023-10-26 ENCOUNTER — Ambulatory Visit (INDEPENDENT_AMBULATORY_CARE_PROVIDER_SITE_OTHER): Admitting: Otolaryngology

## 2023-11-02 ENCOUNTER — Ambulatory Visit (HOSPITAL_COMMUNITY)

## 2023-11-02 ENCOUNTER — Ambulatory Visit (HOSPITAL_COMMUNITY): Admission: RE | Admit: 2023-11-02 | Source: Ambulatory Visit

## 2023-11-03 ENCOUNTER — Ambulatory Visit (INDEPENDENT_AMBULATORY_CARE_PROVIDER_SITE_OTHER): Admitting: Otolaryngology

## 2023-11-08 ENCOUNTER — Telehealth: Admitting: Physician Assistant

## 2023-11-08 ENCOUNTER — Ambulatory Visit: Admitting: Family Medicine

## 2023-11-08 DIAGNOSIS — M545 Low back pain, unspecified: Secondary | ICD-10-CM

## 2023-11-08 NOTE — Progress Notes (Signed)
  Because of persistent/recurring symptoms despite treatment via e-visit in the past  couple of weeks, I feel your condition warrants further evaluation and I recommend that you be seen in a face-to-face visit.   NOTE: There will be NO CHARGE for this E-Visit   If you are having a true medical emergency, please call 911.     For an urgent face to face visit, [facility] has multiple urgent care centers for your convenience.  Click the link below for the full list of locations and hours, walk-in wait times, appointment scheduling options and driving directions:  Urgent Care - [place], [place], [place], [place], [place], [place]       Your MyChart E-visit questionnaire answers were reviewed by a board certified advanced clinical practitioner to complete your personal care plan based on your specific symptoms.    Thank you for using e-Visits.

## 2023-11-23 ENCOUNTER — Ambulatory Visit (HOSPITAL_COMMUNITY)
Admission: RE | Admit: 2023-11-23 | Discharge: 2023-11-23 | Disposition: A | Discharge status: Home / Self Care | Source: Ambulatory Visit | Attending: *Deleted | Admitting: *Deleted

## 2023-11-23 ENCOUNTER — Ambulatory Visit (HOSPITAL_COMMUNITY)
Admission: RE | Admit: 2023-11-23 | Discharge: 2023-11-23 | Disposition: A | Discharge status: Home / Self Care | Source: Ambulatory Visit | Attending: Otolaryngology

## 2023-11-23 ENCOUNTER — Ambulatory Visit (HOSPITAL_COMMUNITY)
Admission: RE | Admit: 2023-11-23 | Discharge: 2023-11-23 | Disposition: A | Discharge status: Home / Self Care | Source: Ambulatory Visit | Attending: Otolaryngology | Admitting: Otolaryngology

## 2023-11-23 DIAGNOSIS — R1310 Dysphagia, unspecified: Secondary | ICD-10-CM

## 2023-11-23 DIAGNOSIS — R1314 Dysphagia, pharyngoesophageal phase: Secondary | ICD-10-CM

## 2023-11-23 DIAGNOSIS — R09A2 Foreign body sensation, throat: Secondary | ICD-10-CM | POA: Insufficient documentation

## 2023-11-23 NOTE — Progress Notes (Signed)
 Modified Barium Swallow Study  Patient Details  Name: Luis Boyer MRN: [id number] Date of Birth: 10/15/1990  Today's Date: 11/23/2023  Modified Barium Swallow completed.  Full report located under Chart Review in the Imaging Section.  History of Present Illness Pt is a 33 year old make arriving for an OP MBS, referred by ENT. No significant finding from ENT visit. Pt dx with large thyroid  goiter (seems to essentially be completely replaced by nodules on US ?) with compressive symptoms - dysphagia, pressure sensation. Pt reports he has to chew his food well, food feels like it hesitates, but then passes.   Clinical Impression Pt demonstrates oral and oropharyngeal function WNL. Pt initiates swallow at pyriform sinus. No residue, no weakness, no obstruction of bolus apparent with any texture. Pt to f/u with esophagram; pill not given of MBS. No SLP f/u needed at this time, pt to continue current diet. Factors that may increase risk of adverse event in presence of aspiration Noe & Lianne 2021):    Swallow Evaluation Recommendations Recommendations: PO diet PO Diet Recommendation: Regular;Thin liquids (Level 0)      Persis Graffius, Consuelo Fitch 11/23/2023,1:03 PM

## 2023-11-28 ENCOUNTER — Ambulatory Visit: Admitting: Family Medicine

## 2023-11-30 ENCOUNTER — Ambulatory Visit (INDEPENDENT_AMBULATORY_CARE_PROVIDER_SITE_OTHER): Admitting: Otolaryngology

## 2023-12-06 ENCOUNTER — Encounter (INDEPENDENT_AMBULATORY_CARE_PROVIDER_SITE_OTHER): Payer: Self-pay | Admitting: Otolaryngology

## 2023-12-06 ENCOUNTER — Ambulatory Visit (INDEPENDENT_AMBULATORY_CARE_PROVIDER_SITE_OTHER): Admitting: Otolaryngology

## 2023-12-06 VITALS — BP 150/91 | HR 91 | Ht 72.0 in

## 2023-12-06 DIAGNOSIS — E049 Nontoxic goiter, unspecified: Secondary | ICD-10-CM | POA: Diagnosis not present

## 2023-12-06 DIAGNOSIS — R1314 Dysphagia, pharyngoesophageal phase: Secondary | ICD-10-CM | POA: Diagnosis not present

## 2023-12-06 NOTE — Progress Notes (Signed)
 Dear Dr. Frann, Here is my assessment for our mutual patient, Luis Boyer. Thank you for allowing me the opportunity to care for your patient. Please do not hesitate to contact me should you have any other questions. Sincerely, Dr. Eldora Blanch  Otolaryngology Clinic Note Referring provider: Dr. Frann HPI:  Luis Boyer is a 33 y.o. male kindly referred by Dr. Frann for evaluation of goiter and dysphagia  Initial visit (08/2023): First noted: incidentally by PCP in 2025, ordered US  and TFTs  Enlarging: he feels like it is getting bigger Compressive symptoms: does report that he is having a harder time swallowing solids (has to chew it significantly, then can swallow). Breathing without issue, denies hoarseness. No pain. No ocular symptoms Hypo or hyperthyroid symptoms: denies Tobacco: no  Accompanied by wife.  Prior evaluation has included: labs, US   History of radiation to H&N: no Family history of thyroid  cancer: grandmother (maternal side)  --------------------------------------------------------- 12/06/2023 Returns for follow up. He reports that there is some discomfort when he swallows but he reports that otherwise, he does ok. No other compressive symptoms. We discussed his swallow test and next steps.    ENT Surgery: denies Personal or FHx of bleeding dz or anesthesia difficulty: no  GLP-1: no AP/AC: no  PMHx: DM, HTN, MDD  Independent Review of Additional Tests or Records:  Dr. Frann (05/16/2023): noted thyromegaly, some dysphagia; Dx: US , Rx: ref to ENT CMP 09/09/2021: BUN/Cr wnl; TSH 09/09/2021 - 0.06; FT4: 0.86 Thyroid  US  independently interpreted 06/04/2023: enlarged, heterogenous (essentially replaced by nodules?); some calcification left inferior gland.  TSH, FT4, Anti-TPO, Thyrotropin receptor (09/05/2023): wnl - TSH borderline (0.410) MBS and Esophagram 11/23/2023 independently interpreted: no noted aspiration but could not swallow barium tablet. Noted  generally safe swallow; no significant dysmotility appreciated PMH/Meds/All/SocHx/FamHx/ROS:   Past Medical History:  Diagnosis Date   ADD (attention deficit disorder)    Asthma    Bipolar 1 disorder (HCC)    Diabetes mellitus type 2 in obese 08/02/2016   Hypertension    Obesity      Past Surgical History:  Procedure Laterality Date   NO PAST SURGERIES      Family History  Problem Relation Age of Onset   Cervical cancer Mother    Diabetes Father    Stroke Father    Hypertension Father    Hyperlipidemia Father    Hyperlipidemia Brother    Hypertension Brother    Breast cancer Paternal Aunt    Breast cancer Paternal Grandmother    Colon cancer Neg Hx      Social Connections: Unknown (05/16/2023)   Social Connection and Isolation Panel    Frequency of Communication with Friends and Family: Never    Frequency of Social Gatherings with Friends and Family: Never    Attends Religious Services: More than 4 times per year    Active Member of [employer] or Organizations: Patient declined    Attends Engineer, structural: Not on file    Marital Status: Married      Current Outpatient Medications:    albuterol  (VENTOLIN  HFA) 108 (90 Base) MCG/ACT inhaler, Inhale 1-2 puffs into the lungs every 6 (six) hours as needed for wheezing or shortness of breath., Disp: 18 g, Rfl: 1   amLODipine  (NORVASC ) 10 MG tablet, Take 1 tablet (10 mg total) by mouth daily., Disp: 90 tablet, Rfl: 3   benzonatate  (TESSALON ) 100 MG capsule, Take 1-2 capsules (100-200 mg total) by mouth 3 (three) times daily as needed., Disp: 30 capsule,  Rfl: 0   chlorthalidone  (HYGROTON ) 25 MG tablet, Take 1 tablet (25 mg total) by mouth daily., Disp: 30 tablet, Rfl: 5   clotrimazole -betamethasone  (LOTRISONE ) cream, Apply topically 2 (two) times daily. APPLY TOPICALLY TO PENIS TWICE DAILY FOR 10-14 DAYS, Disp: 30 g, Rfl: 0   cyclobenzaprine  (FLEXERIL ) 10 MG tablet, Take 0.5-1 tablets (5-10 mg total) by mouth 3 (three)  times daily as needed., Disp: 30 tablet, Rfl: 0   EQ ALLERGY RELIEF 10 MG tablet, Take 1 tablet by mouth once daily, Disp: 30 tablet, Rfl: 0   fluticasone  (FLONASE ) 50 MCG/ACT nasal spray, Place 2 sprays into both nostrils daily., Disp: 16 g, Rfl: 0   glyBURIDE  (DIABETA ) 5 MG tablet, Take 1 tablet by mouth once daily with breakfast, Disp: 30 tablet, Rfl: 0   lidocaine  (LIDODERM ) 5 %, Place 1 patch onto the skin daily. Remove & Discard patch within 12 hours or as directed by MD, Disp: 30 patch, Rfl: 0   metFORMIN  (GLUCOPHAGE ) 500 MG tablet, TAKE 2 TABLETS BY MOUTH TWICE DAILY WITH MEALS, Disp: 120 tablet, Rfl: 5   Multiple Vitamin (MULTIVITAMIN WITH MINERALS) TABS tablet, Take 1 tablet by mouth daily., Disp: , Rfl:    nortriptyline  (PAMELOR ) 10 MG capsule, Take 1 capsule (10 mg total) by mouth at bedtime., Disp: 30 capsule, Rfl: 0   sertraline  (ZOLOFT ) 100 MG tablet, Take 1 tablet (100 mg total) by mouth daily., Disp: 90 tablet, Rfl: 0   Physical Exam:   BP (!) 150/91 (BP Location: Left Arm, Patient Position: Sitting, Cuff Size: Large)   Pulse 91   Ht 6' (1.829 m)   SpO2 96%   BMI 47.74 kg/m   Salient findings:  CN II-XII intact Bilateral EAC clear and TM intact with well pneumatized middle ear spaces Anterior rhinoscopy: Septum intact, dev right;; bilateral inferior turbinates without significant hypertrophy No lesions of oral cavity/oropharynx; dentition fair No obviously palpable neck masses except for large thyroid  goiter, left more nodular than right, appears all suprasternal No respiratory distress or stridor; easily lays flat; voice quality class 2 Seprately Identifiable Procedures:  Prior to initiating any procedures, risks/benefits/alternatives were explained to the patient and verbal consent obtained. Procedure Note (prior, not today) Pre-procedure diagnosis:  Dysphagia, large thyroid  mass Post-procedure diagnosis: Same Procedure: Transnasal Fiberoptic Laryngoscopy, CPT 31575 -  Mod 25 Indication: see above Complications: None apparent EBL: 0 mL  The procedure was undertaken to further evaluate the patient's complaint above, with mirror exam inadequate for appropriate examination due to gag reflex and poor patient tolerance  Procedure:  Patient was identified as correct patient. Verbal consent was obtained. The nose was sprayed with oxymetazoline and 4% lidocaine . The The flexible laryngoscope was passed through the nose to view the nasal cavity, pharynx (oropharynx, hypopharynx) and larynx.  The larynx was examined at rest and during multiple phonatory tasks. Documentation was obtained and reviewed with patient. The scope was removed. The patient tolerated the procedure well.  Findings: The nasal cavity and nasopharynx did not reveal any masses or lesions, mucosa appeared to be without obvious lesions. The tongue base, pharyngeal walls, piriform sinuses, vallecula, epiglottis and postcricoid region are normal in appearance with mild post-cricoid edema and redundant mucosa. The visualized portion of the subglottis and proximal trachea is widely patent. The vocal folds are mobile bilaterally. There are no lesions on the free edge of the vocal folds nor elsewhere in the larynx worrisome for malignancy.      Electronically signed by: Eldora KATHEE Blanch, MD  12/06/2023 12:21 PM   Impression & Plans:  Darrick Greenlaw is a 33 y.o. male with: 1. Thyroid  goiter   2. Pharyngoesophageal dysphagia    Noted large thyroid  goiter (seems to essentially be completely replaced by nodules on US ? But no obvious FNA target?) with compressive symptoms - pressure sensation. TFTs essentially wnl. Swallow eval overall with safe swallow  As such, we discussed options including total thyroidectomy v/s lobectomy (start with bigger side and see how he feels) v/s FNA v/s observatoin  He will think about it and let us  know. No airway issues currently.  Otherwise will f/u in 1 year with thyroid  US ,  sooner if issues  See below regarding exact medications prescribed this encounter including dosages and route: No orders of the defined types were placed in this encounter.     Thank you for allowing me the opportunity to care for your patient. Please do not hesitate to contact me should you have any other questions.  Sincerely, Eldora Blanch, MD Otolaryngologist (ENT), [facility] Phone: [phone] Fax: [phone]  12/06/2023, 12:21 PM   I have personally spent 30 minutes involved in face-to-face and non-face-to-face activities for this patient on the day of the visit.  Professional time spent excludes any procedures performed but includes the following activities, in addition to those noted in the documentation: preparing to see the patient (review of outside documentation and results), performing a medically appropriate examination, counseling, documenting in the electronic health record, independently interpreting results (MBS, Esophagram).

## 2024-02-26 ENCOUNTER — Encounter: Payer: Self-pay | Admitting: Family Medicine

## 2024-02-27 ENCOUNTER — Encounter: Payer: Self-pay | Admitting: Family Medicine

## 2024-03-01 ENCOUNTER — Telehealth

## 2024-03-01 DIAGNOSIS — R051 Acute cough: Secondary | ICD-10-CM | POA: Diagnosis not present

## 2024-03-02 MED ORDER — BENZONATATE 100 MG PO CAPS: 100.0000 mg | ORAL_CAPSULE | Freq: Three times a day (TID) | ORAL | 0 refills | Status: AC | PRN

## 2024-03-02 NOTE — Progress Notes (Signed)
 We are sorry that you are not feeling well.  Here is how we plan to help!  Based on your presentation I believe you most likely have A cough due to a virus.  This is called viral bronchitis and is best treated by rest, plenty of fluids and control of the cough.  You may use Ibuprofen  or Tylenol  as directed to help your symptoms.     In addition you may use A non-prescription cough medication called Mucinex DM: take 2 tablets every 12 hours. and A prescription cough medication called Tessalon  Perles 100mg . You may take 1-2 capsules every 8 hours as needed for your cough.   From your responses in the eVisit questionnaire you describe inflammation in the upper respiratory tract which is causing a significant cough.  This is commonly called Bronchitis and has four common causes:   Allergies Viral Infections Acid Reflux Bacterial Infection Allergies, viruses and acid reflux are treated by controlling symptoms or eliminating the cause. An example might be a cough caused by taking certain blood pressure medications. You stop the cough by changing the medication. Another example might be a cough caused by acid reflux. Controlling the reflux helps control the cough.  USE OF BRONCHODILATOR (RESCUE) INHALERS: There is a risk from using your bronchodilator too frequently.  The risk is that over-reliance on a medication which only relaxes the muscles surrounding the breathing tubes can reduce the effectiveness of medications prescribed to reduce swelling and congestion of the tubes themselves.  Although you feel brief relief from the bronchodilator inhaler, your asthma may actually be worsening with the tubes becoming more swollen and filled with mucus.  This can delay other crucial treatments, such as oral steroid medications. If you need to use a bronchodilator inhaler daily, several times per day, you should discuss this with your provider.  There are probably better treatments that could be used to keep your  asthma under control.     HOME CARE Only take medications as instructed by your medical team. Complete the entire course of an antibiotic. Drink plenty of fluids and get plenty of rest. Avoid close contacts especially the very young and the elderly Cover your mouth if you cough or cough into your sleeve. Always remember to wash your hands A steam or ultrasonic humidifier can help congestion.   GET HELP RIGHT AWAY IF: You develop worsening fever. You become short of breath You cough up blood. Your symptoms persist after you have completed your treatment plan MAKE SURE YOU  Understand these instructions. Will watch your condition. Will get help right away if you are not doing well or get worse.  Your e-visit answers were reviewed by a board certified advanced clinical practitioner to complete your personal care plan.  Depending on the condition, your plan could have included both over the counter or prescription medications. If there is a problem please reply  once you have received a response from your provider. Your safety is important to us .  If you have drug allergies check your prescription carefully.    You can use MyChart to ask questions about todays visit, request a non-urgent call back, or ask for a work or school excuse for 24 hours related to this e-Visit. If it has been greater than 24 hours you will need to follow up with your provider, or enter a new e-Visit to address those concerns. You will get an e-mail in the next two days asking about your experience.  I hope that your e-visit  has been valuable and will speed your recovery. Thank you for using e-visits.   I have spent 5 minutes in review of e-visit questionnaire, review and updating patient chart, medical decision making and response to patient.   Roosvelt Mater, PA-C

## 2024-03-06 ENCOUNTER — Ambulatory Visit: Admitting: Family Medicine

## 2024-03-10 ENCOUNTER — Other Ambulatory Visit: Payer: Self-pay | Admitting: Family Medicine

## 2024-03-10 DIAGNOSIS — F324 Major depressive disorder, single episode, in partial remission: Secondary | ICD-10-CM

## 2024-03-10 DIAGNOSIS — F411 Generalized anxiety disorder: Secondary | ICD-10-CM

## 2024-03-13 ENCOUNTER — Ambulatory Visit: Admitting: Family Medicine

## 2024-03-20 ENCOUNTER — Ambulatory Visit: Admitting: Family Medicine

## 2024-04-03 ENCOUNTER — Ambulatory Visit: Admitting: Family Medicine

## 2024-04-12 ENCOUNTER — Other Ambulatory Visit: Payer: Self-pay | Admitting: Family Medicine

## 2024-04-12 DIAGNOSIS — E119 Type 2 diabetes mellitus without complications: Secondary | ICD-10-CM

## 2024-04-17 ENCOUNTER — Ambulatory Visit: Admitting: Family Medicine
